# Patient Record
Sex: Female | Born: 1955 | Race: Black or African American | Hispanic: No | Smoking: Former smoker
Health system: Southern US, Community
[De-identification: ages and names within clinical notes are randomized; demographics above are authoritative.]

## PROBLEM LIST (undated history)

## (undated) DIAGNOSIS — K5900 Constipation, unspecified: Secondary | ICD-10-CM

## (undated) DIAGNOSIS — M2550 Pain in unspecified joint: Secondary | ICD-10-CM

## (undated) DIAGNOSIS — F32A Depression, unspecified: Secondary | ICD-10-CM

## (undated) DIAGNOSIS — E559 Vitamin D deficiency, unspecified: Secondary | ICD-10-CM

## (undated) DIAGNOSIS — M549 Dorsalgia, unspecified: Secondary | ICD-10-CM

## (undated) DIAGNOSIS — D649 Anemia, unspecified: Secondary | ICD-10-CM

## (undated) DIAGNOSIS — R0602 Shortness of breath: Secondary | ICD-10-CM

## (undated) DIAGNOSIS — F419 Anxiety disorder, unspecified: Secondary | ICD-10-CM

## (undated) DIAGNOSIS — E669 Obesity, unspecified: Secondary | ICD-10-CM

## (undated) DIAGNOSIS — K219 Gastro-esophageal reflux disease without esophagitis: Secondary | ICD-10-CM

## (undated) DIAGNOSIS — R600 Localized edema: Secondary | ICD-10-CM

## (undated) DIAGNOSIS — E785 Hyperlipidemia, unspecified: Secondary | ICD-10-CM

## (undated) DIAGNOSIS — R0789 Other chest pain: Secondary | ICD-10-CM

## (undated) HISTORY — DX: Gastro-esophageal reflux disease without esophagitis: K21.9

## (undated) HISTORY — DX: Anxiety disorder, unspecified: F41.9

## (undated) HISTORY — DX: Vitamin D deficiency, unspecified: E55.9

## (undated) HISTORY — DX: Dorsalgia, unspecified: M54.9

## (undated) HISTORY — DX: Pain in unspecified joint: M25.50

## (undated) HISTORY — DX: Depression, unspecified: F32.A

## (undated) HISTORY — DX: Localized edema: R60.0

## (undated) HISTORY — DX: Hyperlipidemia, unspecified: E78.5

## (undated) HISTORY — DX: Constipation, unspecified: K59.00

## (undated) HISTORY — DX: Anemia, unspecified: D64.9

## (undated) HISTORY — DX: Obesity, unspecified: E66.9

---
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
## 1981-12-21 HISTORY — PX: OOPHORECTOMY: SHX86

## 2004-08-19 ENCOUNTER — Emergency Department (HOSPITAL_COMMUNITY): Admission: EM | Admit: 2004-08-19 | Discharge: 2004-08-19 | Payer: Self-pay | Admitting: Emergency Medicine

## 2006-05-21 ENCOUNTER — Ambulatory Visit (HOSPITAL_COMMUNITY): Admission: RE | Admit: 2006-05-21 | Discharge: 2006-05-21 | Payer: Self-pay | Admitting: Nephrology

## 2006-05-24 ENCOUNTER — Ambulatory Visit (HOSPITAL_COMMUNITY): Admission: RE | Admit: 2006-05-24 | Discharge: 2006-05-24 | Payer: Self-pay | Admitting: Nephrology

## 2009-01-28 ENCOUNTER — Emergency Department (HOSPITAL_COMMUNITY): Admission: EM | Admit: 2009-01-28 | Discharge: 2009-01-28 | Payer: Self-pay | Admitting: Family Medicine

## 2009-10-14 ENCOUNTER — Encounter: Admission: RE | Admit: 2009-10-14 | Discharge: 2009-10-14 | Payer: Self-pay | Admitting: Internal Medicine

## 2009-11-05 LAB — HM COLONOSCOPY

## 2010-11-09 ENCOUNTER — Emergency Department (HOSPITAL_COMMUNITY): Admission: EM | Admit: 2010-11-09 | Discharge: 2010-11-10 | Payer: Self-pay | Admitting: Emergency Medicine

## 2010-11-09 ENCOUNTER — Emergency Department (HOSPITAL_COMMUNITY): Admission: EM | Admit: 2010-11-09 | Discharge: 2010-11-09 | Payer: Self-pay | Admitting: Family Medicine

## 2011-03-03 LAB — HEMOCCULT GUIAC POC 1CARD (OFFICE): Fecal Occult Bld: POSITIVE

## 2011-03-03 LAB — CBC
HCT: 37.6 % (ref 36.0–46.0)
Hemoglobin: 12.7 g/dL (ref 12.0–15.0)
MCH: 23.9 pg — ABNORMAL LOW (ref 26.0–34.0)
MCHC: 33.8 g/dL (ref 30.0–36.0)
MCV: 70.8 fL — ABNORMAL LOW (ref 78.0–100.0)
Platelets: 190 10*3/uL (ref 150–400)
RBC: 5.31 MIL/uL — ABNORMAL HIGH (ref 3.87–5.11)
RDW: 16 % — ABNORMAL HIGH (ref 11.5–15.5)
WBC: 7.8 10*3/uL (ref 4.0–10.5)

## 2011-03-03 LAB — URINE MICROSCOPIC-ADD ON

## 2011-03-03 LAB — COMPREHENSIVE METABOLIC PANEL
ALT: 10 U/L (ref 0–35)
AST: 16 U/L (ref 0–37)
Albumin: 3.1 g/dL — ABNORMAL LOW (ref 3.5–5.2)
Alkaline Phosphatase: 55 U/L (ref 39–117)
BUN: 9 mg/dL (ref 6–23)
CO2: 21 mEq/L (ref 19–32)
Calcium: 8.4 mg/dL (ref 8.4–10.5)
Chloride: 105 mEq/L (ref 96–112)
Creatinine, Ser: 0.71 mg/dL (ref 0.4–1.2)
GFR calc Af Amer: >60 mL/min (ref >60)
GFR calc non Af Amer: >60 mL/min (ref >60)
Glucose, Bld: 106 mg/dL — ABNORMAL HIGH (ref 70–99)
Potassium: 3.6 mEq/L (ref 3.5–5.1)
Sodium: 133 mEq/L — ABNORMAL LOW (ref 135–145)
Total Bilirubin: 0.4 mg/dL (ref 0.3–1.2)
Total Protein: 6.3 g/dL (ref 6.0–8.3)

## 2011-03-03 LAB — STOOL CULTURE

## 2011-03-03 LAB — URINALYSIS, ROUTINE W REFLEX MICROSCOPIC
Glucose, UA: NEGATIVE mg/dL
Ketones, ur: >80 mg/dL — AB
Leukocytes, UA: NEGATIVE
Nitrite: NEGATIVE
Protein, ur: 30 mg/dL — AB
Specific Gravity, Urine: 1.034 — ABNORMAL HIGH (ref 1.005–1.030)
Urobilinogen, UA: 1 mg/dL (ref 0.0–1.0)
pH: 6 (ref 5.0–8.0)

## 2011-03-03 LAB — URINE CULTURE
Colony Count: 35000
Culture  Setup Time: 201111210850

## 2011-03-03 LAB — DIFFERENTIAL
Basophils Absolute: 0 10*3/uL (ref 0.0–0.1)
Basophils Relative: 1 % (ref 0–1)
Eosinophils Absolute: 0.2 10*3/uL (ref 0.0–0.7)
Eosinophils Relative: 3 % (ref 0–5)
Lymphocytes Relative: 17 % (ref 12–46)
Lymphs Abs: 1.3 10*3/uL (ref 0.7–4.0)
Monocytes Absolute: 1.3 10*3/uL — ABNORMAL HIGH (ref 0.1–1.0)
Monocytes Relative: 17 % — ABNORMAL HIGH (ref 3–12)
Neutro Abs: 4.9 10*3/uL (ref 1.7–7.7)
Neutrophils Relative %: 63 % (ref 43–77)

## 2011-03-03 LAB — CLOSTRIDIUM DIFFICILE EIA

## 2011-03-03 LAB — GIARDIA/CRYPTOSPORIDIUM SCREEN(EIA)
Cryptosporidium Screen (EIA): NEGATIVE
Giardia Screen - EIA: NEGATIVE

## 2012-05-19 ENCOUNTER — Encounter: Payer: Managed Care, Other (non HMO) | Attending: Internal Medicine | Admitting: Dietician

## 2012-05-19 ENCOUNTER — Encounter: Payer: Self-pay | Admitting: Dietician

## 2012-05-19 DIAGNOSIS — Z713 Dietary counseling and surveillance: Secondary | ICD-10-CM | POA: Insufficient documentation

## 2012-05-19 DIAGNOSIS — E119 Type 2 diabetes mellitus without complications: Secondary | ICD-10-CM | POA: Insufficient documentation

## 2012-05-19 NOTE — Progress Notes (Signed)
  Medical Nutrition Therapy:  Appt start time: 1100 end time:  1200.  Assessment:  Primary concerns today: Coming in for diabetes counseling, has a history of diabetes for 1-2 years.  Previous A1C earlier in the year was 6.7%.  Has been working on improving her eating habits.  Her A1C on [place] [address] was 6.2%.  She currently is on the [employer] and her blood glucose levels are even better.  He mother had diabetes and had to have an amputation and this is something she is working to avoid.     Blood Glucose:. Fasting: 98, 90, 99,98,88,87,86,106,96,102,97 PC Dinner: 104, 128, 127, 77, 91, 89, 136, 99, 100, 91.  MEDICATIONS: Completed review of medications.  DIETARY INTAKE:  24-hr recall:  B (4:00 AM): Bowl of oatmeal.  Sometimes has a boiled egg with the oatmeal.  Snk (8:00 AM) :piece of fruit.  L (2:30-3:00 PM): bowl of tomato spinach soup, broccoli, roasted potatoes, sweet potato fries. 4 oz pasta salad with lime/vinegarret dressing  D (6-7-8:00  PM): Bowl of multigrain cheerioes with almond milk.  Beverages: water  Notes that she is doing the [employer] over the last 2 weeks.  She has noticed a marked decrease in her blood glucose levels when she went to the vegetable based diet.  She is following the directive her her MD and has not started the glucose lowering medication and still has fasting blood glucose levels in the range of 80-97.  This has made her aware of how much her lifestyle actions can influence her blood glucose control.  Recent physical activity: Having some issues with her feet being sore.  Has not been walking.  Has agreed to slowly resume her walking.  Discussed the possibility of using the exercise bike, it might not cause as much of a problem for her feet.  Estimated energy needs:HT: 64 in  WT: 224.0 (has lost 5 lb since starting the [employer]) BMI: 38.5 kg/m2 1300-1400 calories 150-155 g carbohydrates 90-100 g protein 37-39 g fat  Progress Towards Goal(s):  In  progress.   Nutritional Diagnosis:  [place]-2.1 Inpaired nutrition utilization As related to glucose.  As evidenced by diagnosis of type 2 diabetes with a HgA1C of 6.2%..    Intervention:  Nutrition Review of nutritional interventions and lifestyle changes to assist in controlling blood glucose and continuing to her A1C.  Handouts given during visit include:  Living Well With Diabetes  Controlling Blood Glucose.  Monitoring/Evaluation:  Dietary intake, exercise, blood glucose levels, and body weight follow-up as needed in 8-12 weeks.  To call or e-mail with questions.

## 2012-05-19 NOTE — Patient Instructions (Addendum)
   Look on line for the [employer] and see if they carry the [place].  Consider the nuts (roasted) and the nut butters for the breakfast time and for snacks.  Try to use the beans with the non-starchy veggies.  Keep the rice and pasta at 1 cup per serving.  Make sure to have some of the non-starchy veggies.  Example beans and rice, at one cup.   1400 calories with carbs at 150-155 carbs per day.  Try to the high number at 160 or less when doing the after meal blood glucose checks.

## 2012-05-23 ENCOUNTER — Encounter: Payer: Self-pay | Admitting: Dietician

## 2012-07-29 ENCOUNTER — Other Ambulatory Visit: Payer: Self-pay | Admitting: Internal Medicine

## 2012-07-29 ENCOUNTER — Telehealth: Payer: Self-pay | Admitting: Obstetrics and Gynecology

## 2012-07-29 DIAGNOSIS — Z1231 Encounter for screening mammogram for malignant neoplasm of breast: Secondary | ICD-10-CM

## 2012-08-02 ENCOUNTER — Ambulatory Visit (INDEPENDENT_AMBULATORY_CARE_PROVIDER_SITE_OTHER): Payer: Managed Care, Other (non HMO) | Admitting: Obstetrics and Gynecology

## 2012-08-02 ENCOUNTER — Encounter: Payer: Self-pay | Admitting: Obstetrics and Gynecology

## 2012-08-02 VITALS — BP 110/78 | Ht 64.0 in | Wt 224.0 lb

## 2012-08-02 DIAGNOSIS — D219 Benign neoplasm of connective and other soft tissue, unspecified: Secondary | ICD-10-CM

## 2012-08-02 DIAGNOSIS — D259 Leiomyoma of uterus, unspecified: Secondary | ICD-10-CM

## 2012-08-02 DIAGNOSIS — N926 Irregular menstruation, unspecified: Secondary | ICD-10-CM

## 2012-08-02 LAB — CBC
HCT: 38 % (ref 36.0–46.0)
Hemoglobin: 12 g/dL (ref 12.0–15.0)
MCH: 22.7 pg — ABNORMAL LOW (ref 26.0–34.0)
MCHC: 31.6 g/dL (ref 30.0–36.0)
MCV: 72 fL — ABNORMAL LOW (ref 78.0–100.0)
Platelets: 242 10*3/uL (ref 150–400)
RBC: 5.28 MIL/uL — ABNORMAL HIGH (ref 3.87–5.11)
RDW: 18.2 % — ABNORMAL HIGH (ref 11.5–15.5)
WBC: 4.8 10*3/uL (ref 4.0–10.5)

## 2012-08-02 LAB — PROLACTIN: Prolactin: 15.3 ng/mL

## 2012-08-02 LAB — TSH: TSH: 1.515 u[IU]/mL (ref 0.350–4.500)

## 2012-08-02 NOTE — Progress Notes (Signed)
When did bleeding start: Yesterday, Pt had a period in May and spotting in June but previously had not had a period for 2 years How  Long: 1 day spotting How often changing pad/tampon:   Bleeding Disorders: no Cramping: yes Contraception: no Fibroids: yes, years ago Hormone Therapy: no New Medications: no Menopausal Symptoms: yes Vag. Discharge: no Abdominal Pain: yes, sometimes Increased Stress: no BP 110/78  Ht 5\' 4"  (1.626 m)  Wt 224 lb (101.606 kg)  BMI 38.45 kg/m2  LMP 08/01/2012 Physical Examination: General appearance - alert, well appearing, and in no distress Chest - clear to auscultation, no wheezes, rales or rhonchi, symmetric air entry Heart - normal rate and regular rhythm Abdomen - soft, nontender, nondistended, no masses or organomegaly Pelvic - normal external genitalia, vulva, vagina, cervix, uterus and adnexa Extremities - peripheral pulses normal, no pedal edema, no clubbing or cyanosis PMVB Embx/us poss shg @[place]  Check cbc, tsh and prolactin Treatments reviewed will decide @[place] 

## 2012-08-02 NOTE — Patient Instructions (Signed)
Fibroids Fibroids are lumps (tumors) that can occur any place in a woman's body. These lumps are not cancerous. Fibroids vary in size, weight, and where they grow. HOME CARE  Do not take aspirin.   Write down the number of pads or tampons you use during your period. Tell your doctor. This can help determine the best treatment for you.  GET HELP RIGHT AWAY IF:  You have pain in your lower belly (abdomen) that is not helped with medicine.   You have cramps that are not helped with medicine.   You have more bleeding between or during your period.   You feel lightheaded or pass out (faint).   Your lower belly pain gets worse.  MAKE SURE YOU:  Understand these instructions.   Will watch your condition.   Will get help right away if you are not doing well or get worse.  Document Released: 01/09/2011 Document Revised: 11/26/2011 Document Reviewed: 01/09/2011 [employer] Patient Information 2012 [employer]. 

## 2012-08-15 ENCOUNTER — Ambulatory Visit
Admission: RE | Admit: 2012-08-15 | Discharge: 2012-08-15 | Disposition: A | Discharge status: Home / Self Care | Payer: Managed Care, Other (non HMO) | Source: Ambulatory Visit | Attending: Internal Medicine | Admitting: Internal Medicine

## 2012-08-15 DIAGNOSIS — Z1231 Encounter for screening mammogram for malignant neoplasm of breast: Secondary | ICD-10-CM

## 2013-08-22 ENCOUNTER — Encounter (HOSPITAL_COMMUNITY): Payer: Self-pay | Admitting: Emergency Medicine

## 2013-08-22 ENCOUNTER — Emergency Department (INDEPENDENT_AMBULATORY_CARE_PROVIDER_SITE_OTHER)
Admission: EM | Admit: 2013-08-22 | Discharge: 2013-08-22 | Disposition: A | Discharge status: Home / Self Care | Payer: Managed Care, Other (non HMO) | Source: Home / Self Care | Attending: Family Medicine | Admitting: Family Medicine

## 2013-08-22 DIAGNOSIS — M461 Sacroiliitis, not elsewhere classified: Secondary | ICD-10-CM

## 2013-08-22 DIAGNOSIS — M549 Dorsalgia, unspecified: Secondary | ICD-10-CM

## 2013-08-22 MED ORDER — NAPROXEN 500 MG PO TABS: 500.0000 mg | ORAL_TABLET | Freq: Two times a day (BID) | ORAL | Status: DC

## 2013-08-22 MED ORDER — TRAMADOL HCL 50 MG PO TABS: 50.0000 mg | ORAL_TABLET | Freq: Four times a day (QID) | ORAL | Status: DC | PRN
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
## 2013-08-23 NOTE — ED Provider Notes (Signed)
Medical screening examination/treatment/procedure(s) were performed by resident physician or non-physician practitioner and as supervising physician I was immediately available for consultation/collaboration.   KINDL,JAMES DOUGLAS MD.   James D Kindl, MD 08/23/13 2143 

## 2014-02-23 ENCOUNTER — Other Ambulatory Visit: Payer: Self-pay

## 2014-02-23 DIAGNOSIS — Z1231 Encounter for screening mammogram for malignant neoplasm of breast: Secondary | ICD-10-CM

## 2014-03-01 ENCOUNTER — Ambulatory Visit
Admission: RE | Admit: 2014-03-01 | Discharge: 2014-03-01 | Disposition: A | Discharge status: Home / Self Care | Payer: Managed Care, Other (non HMO) | Source: Ambulatory Visit

## 2014-03-01 DIAGNOSIS — Z1231 Encounter for screening mammogram for malignant neoplasm of breast: Secondary | ICD-10-CM

## 2014-10-22 ENCOUNTER — Encounter (HOSPITAL_COMMUNITY): Payer: Self-pay | Admitting: Emergency Medicine

## 2015-12-20 ENCOUNTER — Other Ambulatory Visit: Payer: Self-pay

## 2015-12-20 DIAGNOSIS — Z1231 Encounter for screening mammogram for malignant neoplasm of breast: Secondary | ICD-10-CM

## 2015-12-27 ENCOUNTER — Ambulatory Visit
Admission: RE | Admit: 2015-12-27 | Discharge: 2015-12-27 | Disposition: A | Discharge status: Home / Self Care | Payer: Managed Care, Other (non HMO) | Source: Ambulatory Visit

## 2015-12-27 DIAGNOSIS — Z1231 Encounter for screening mammogram for malignant neoplasm of breast: Secondary | ICD-10-CM

## 2016-06-24 ENCOUNTER — Ambulatory Visit (HOSPITAL_COMMUNITY)
Admission: EM | Admit: 2016-06-24 | Discharge: 2016-06-24 | Disposition: A | Discharge status: Home / Self Care | Payer: Managed Care, Other (non HMO) | Attending: Emergency Medicine | Admitting: Emergency Medicine

## 2016-06-24 ENCOUNTER — Encounter (HOSPITAL_COMMUNITY): Payer: Self-pay | Admitting: Emergency Medicine

## 2016-06-24 DIAGNOSIS — M5441 Lumbago with sciatica, right side: Secondary | ICD-10-CM | POA: Diagnosis not present

## 2016-06-24 DIAGNOSIS — M5431 Sciatica, right side: Secondary | ICD-10-CM

## 2016-06-24 MED ORDER — CYCLOBENZAPRINE HCL 10 MG PO TABS: 10.0000 mg | ORAL_TABLET | Freq: Two times a day (BID) | ORAL | Status: DC | PRN

## 2016-06-24 MED ORDER — PREDNISONE 10 MG PO TABS: 20.0000 mg | ORAL_TABLET | Freq: Every day | ORAL | Status: DC

## 2016-06-24 MED ORDER — HYDROCODONE-ACETAMINOPHEN 5-325 MG PO TABS: 1.0000 | ORAL_TABLET | Freq: Every evening | ORAL | Status: DC | PRN

## 2016-06-24 NOTE — ED Provider Notes (Signed)
CSN: [id number]     Arrival date & time 06/24/16  1002 History   First MD Initiated Contact with Patient 06/24/16 1059     Chief Complaint  Patient presents with  . Back Pain   (Consider location/radiation/quality/duration/timing/severity/associated sxs/prior Treatment) HPI History obtained from patient: Location:  Right low back Context/Duration: Onset approximately one week ago suddenly while helping at work  Severity: 4  Quality: Aching, throbbing Timing:    Constant        Home Treatment: Over-the-counter medicines without relief of symptoms Associated symptoms:  Painful to sit for long periods of time Family History: Diabetes-mother    Past Medical History  Diagnosis Date  . Diabetes mellitus   . Hyperlipidemia    Past Surgical History  Procedure Laterality Date  . Oophorectomy      left ovary removed   Family History  Problem Relation Age of Onset  . Diabetes Mother   . Cancer Other   . Hypertension Other   . Hyperlipidemia Other   . Stroke Other   . Heart attack Other    Social History  Substance Use Topics  . Smoking status: Current Some Day Smoker -- 0.10 packs/day    Types: Cigarettes  . Smokeless tobacco: Never Used  . Alcohol Use: No   OB History    Gravida Para Term Preterm AB TAB SAB Ectopic Multiple Living   6 3        3      Review of Systems  Denies: HEADACHE, NAUSEA, ABDOMINAL PAIN, CHEST PAIN, CONGESTION, DYSURIA, SHORTNESS OF BREATH  Allergies  Eggs or egg-derived products and Penicillins  Home Medications   Prior to Admission medications   Medication Sig Start Date End Date Taking? Authorizing Provider  cyclobenzaprine (FLEXERIL) 10 MG tablet Take 10 mg by mouth Nightly. Take at bedtime    Historical Provider, MD  naproxen (NAPROSYN) 500 MG tablet Take 1 tablet (500 mg total) by mouth 2 (two) times daily. 08/22/13   Liam Graham, PA-C  Saxagliptin-Metformin (KOMBIGLYZE XR) 5-500 MG TB24 Take 1 tablet by mouth daily with supper.     Historical Provider, MD  simvastatin (ZOCOR) 20 MG tablet Take 20 mg by mouth every evening.    Historical Provider, MD  sitaGLIPtin-metformin (JANUMET) 50-1000 MG tablet Take 1 tablet by mouth 2 (two) times daily with a meal.    Historical Provider, MD  traMADol (ULTRAM) 50 MG tablet Take 1 tablet (50 mg total) by mouth every 6 (six) hours as needed for pain. 08/22/13   Liam Graham, PA-C   Meds Ordered and Administered this Visit  Medications - No data to display  BP 121/79 mmHg  Pulse 63  Temp(Src) 97.7 F (36.5 C) (Oral)  Resp 18  SpO2 100%  LMP 08/12/2012 No data found.   Physical Exam NURSES NOTES AND VITAL SIGNS REVIEWED. CONSTITUTIONAL: Well developed, well nourished, no acute distress HEENT: normocephalic, atraumatic EYES: Conjunctiva normal NECK:normal ROM, supple, no adenopathy PULMONARY:No respiratory distress, normal effort ABDOMINAL: Soft, ND, NT BS+, No CVAT MUSCULOSKELETAL: Normal ROM of all extremities, Right lumbar spine tender to palpation. There is negative left straight leg raise positive right straight leg raise. SKIN: warm and dry without rash PSYCHIATRIC: Mood and affect, behavior are normal  ED Course  Procedures (including critical care time)  Labs Review Labs Reviewed - No data to display  Imaging Review No results found.   Visual Acuity Review  Right Eye Distance:   Left Eye Distance:   Bilateral Distance:  Right Eye Near:   Left Eye Near:    Bilateral Near:       Prescriptions for prednisone and Flexeril are provided.  MDM   1. Right-sided low back pain with right-sided sciatica   2. Sciatica of right side     Patient is reassured that there are no issues that require transfer to higher level of care at this time or additional tests. Patient is advised to continue home symptomatic treatment. Patient is advised that if there are new or worsening symptoms to attend the emergency department, contact primary care provider, or  return to [facility]. Instructions of care provided discharged home in stable condition.    THIS NOTE WAS GENERATED USING A VOICE RECOGNITION SOFTWARE PROGRAM. ALL REASONABLE EFFORTS  WERE MADE TO PROOFREAD THIS DOCUMENT FOR ACCURACY.  I have verbally reviewed the discharge instructions with the patient. A printed AVS was given to the patient.  All questions were answered prior to discharge.      Konrad Felix, [place] 06/24/16 1351

## 2016-06-24 NOTE — Discharge Instructions (Signed)
Back Exercises °The following exercises strengthen the muscles that help to support the back. They also help to keep the lower back flexible. Doing these exercises can help to prevent back pain or lessen existing pain. °If you have back pain or discomfort, try doing these exercises 2-3 times each day or as told by your health care provider. When the pain goes away, do them once each day, but increase the number of times that you repeat the steps for each exercise (do more repetitions). If you do not have back pain or discomfort, do these exercises once each day or as told by your health care provider. °EXERCISES °Single Knee to Chest °Repeat these steps 3-5 times for each leg: °· Lie on your back on a firm bed or the floor with your legs extended. °· Bring one knee to your chest. Your other leg should stay extended and in contact with the floor. °· Hold your knee in place by grabbing your knee or thigh. °· Pull on your knee until you feel a gentle stretch in your lower back. °· Hold the stretch for 10-30 seconds. °· Slowly release and straighten your leg. °Pelvic Tilt °Repeat these steps 5-10 times: °· Lie on your back on a firm bed or the floor with your legs extended. °· Bend your knees so they are pointing toward the ceiling and your feet are flat on the floor. °· Tighten your lower abdominal muscles to press your lower back against the floor. This motion will tilt your pelvis so your tailbone points up toward the ceiling instead of pointing to your feet or the floor. °· With gentle tension and even breathing, hold this position for 5-10 seconds. °Cat-Cow °Repeat these steps until your lower back becomes more flexible: °1. Get into a hands-and-knees position on a firm surface. Keep your hands under your shoulders, and keep your knees under your hips. You may place padding under your knees for comfort. °2. Let your head hang down, and point your tailbone toward the floor so your lower back becomes rounded like the  back of a cat. °3. Hold this position for 5 seconds. °4. Slowly lift your head and point your tailbone up toward the ceiling so your back forms a sagging arch like the back of a cow. °5. Hold this position for 5 seconds. °Press-Ups °Repeat these steps 5-10 times: °1. Lie on your abdomen (face-down) on the floor. °2. Place your palms near your head, about shoulder-width apart. °3. While you keep your back as relaxed as possible and keep your hips on the floor, slowly straighten your arms to raise the top half of your body and lift your shoulders. Do not use your back muscles to raise your upper torso. You may adjust the placement of your hands to make yourself more comfortable. °4. Hold this position for 5 seconds while you keep your back relaxed. °5. Slowly return to lying flat on the floor. °Bridges °Repeat these steps 10 times: °1. Lie on your back on a firm surface. °2. Bend your knees so they are pointing toward the ceiling and your feet are flat on the floor. °3. Tighten your buttocks muscles and lift your buttocks off of the floor until your waist is at almost the same height as your knees. You should feel the muscles working in your buttocks and the back of your thighs. If you do not feel these muscles, slide your feet 1-2 inches farther away from your buttocks. °4. Hold this position for 3-5   seconds. 5. Slowly lower your hips to the starting position, and allow your buttocks muscles to relax completely. If this exercise is too easy, try doing it with your arms crossed over your chest. Abdominal Crunches Repeat these steps 5-10 times: 1. Lie on your back on a firm bed or the floor with your legs extended. 2. Bend your knees so they are pointing toward the ceiling and your feet are flat on the floor. 3. Cross your arms over your chest. 4. Tip your chin slightly toward your chest without bending your neck. 5. Tighten your abdominal muscles and slowly raise your trunk (torso) high enough to lift your  shoulder blades a tiny bit off of the floor. Avoid raising your torso higher than that, because it can put too much stress on your low back and it does not help to strengthen your abdominal muscles. 6. Slowly return to your starting position. Back Lifts Repeat these steps 5-10 times: 1. Lie on your abdomen (face-down) with your arms at your sides, and rest your forehead on the floor. 2. Tighten the muscles in your legs and your buttocks. 3. Slowly lift your chest off of the floor while you keep your hips pressed to the floor. Keep the back of your head in line with the curve in your back. Your eyes should be looking at the floor. 4. Hold this position for 3-5 seconds. 5. Slowly return to your starting position. SEEK MEDICAL CARE IF:  Your back pain or discomfort gets much worse when you do an exercise.  Your back pain or discomfort does not lessen within 2 hours after you exercise. If you have any of these problems, stop doing these exercises right away. Do not do them again unless your health care provider says that you can. SEEK IMMEDIATE MEDICAL CARE IF:  You develop sudden, severe back pain. If this happens, stop doing the exercises right away. Do not do them again unless your health care provider says that you can.   This information is not intended to replace advice given to you by your health care provider. Make sure you discuss any questions you have with your health care provider.   Document Released: 01/14/2005 Document Revised: 08/28/2015 Document Reviewed: 01/31/2015 Elsevier Interactive Patient Education 2016 [employer] Radicular Pain Radicular pain in either the arm or leg is usually from a bulging or herniated disk in the spine. A piece of the herniated disk may press against the nerves as the nerves exit the spine. This causes pain which is felt at the tips of the nerves down the arm or leg. Other causes of radicular pain may include:  Fractures.  Heart  disease.  Cancer.  An abnormal and usually degenerative state of the nervous system or nerves (neuropathy). Diagnosis may require CT or MRI scanning to determine the primary cause.  Nerves that start at the neck (nerve roots) may cause radicular pain in the outer shoulder and arm. It can spread down to the thumb and fingers. The symptoms vary depending on which nerve root has been affected. In most cases radicular pain improves with conservative treatment. Neck problems may require physical therapy, a neck collar, or cervical traction. Treatment may take many weeks, and surgery may be considered if the symptoms do not improve.  Conservative treatment is also recommended for sciatica. Sciatica causes pain to radiate from the lower back or buttock area down the leg into the foot. Often there is a history of back problems. Most patients with sciatica  are better after 2 to 4 weeks of rest and other supportive care. Short term bed rest can reduce the disk pressure considerably. Sitting, however, is not a good position since this increases the pressure on the disk. You should avoid bending, lifting, and all other activities which make the problem worse. Traction can be used in severe cases. Surgery is usually reserved for patients who do not improve within the first months of treatment. Only take over-the-counter or prescription medicines for pain, discomfort, or fever as directed by your caregiver. Narcotics and muscle relaxants may help by relieving more severe pain and spasm and by providing mild sedation. Cold or massage can give significant relief. Spinal manipulation is not recommended. It can increase the degree of disc protrusion. Epidural steroid injections are often effective treatment for radicular pain. These injections deliver medicine to the spinal nerve in the space between the protective covering of the spinal cord and back bones (vertebrae). Your caregiver can give you more information about  steroid injections. These injections are most effective when given within two weeks of the onset of pain.  You should see your caregiver for follow up care as recommended. A program for neck and back injury rehabilitation with stretching and strengthening exercises is an important part of management.  SEEK IMMEDIATE MEDICAL CARE IF:  You develop increased pain, weakness, or numbness in your arm or leg.  You develop difficulty with bladder or bowel control.  You develop abdominal pain.   This information is not intended to replace advice given to you by your health care provider. Make sure you discuss any questions you have with your health care provider.   Document Released: 01/14/2005 Document Revised: 12/28/2014 Document Reviewed: 07/03/2015 Elsevier Interactive Patient Education [employer].

## 2016-06-24 NOTE — ED Notes (Signed)
The patient presented to the [facility] with a complaint of lower back pain that radiates down her right leg x 1 week. The patient stated that she did do some extensive moving at work that she does not normally do.

## 2018-02-25 ENCOUNTER — Other Ambulatory Visit: Payer: Self-pay | Admitting: Internal Medicine

## 2018-02-25 DIAGNOSIS — Z1231 Encounter for screening mammogram for malignant neoplasm of breast: Secondary | ICD-10-CM

## 2018-03-16 ENCOUNTER — Ambulatory Visit
Admission: RE | Admit: 2018-03-16 | Discharge: 2018-03-16 | Disposition: A | Discharge status: Home / Self Care | Payer: Managed Care, Other (non HMO) | Source: Ambulatory Visit | Attending: Internal Medicine | Admitting: Internal Medicine

## 2018-03-16 DIAGNOSIS — Z1231 Encounter for screening mammogram for malignant neoplasm of breast: Secondary | ICD-10-CM

## 2018-03-17 ENCOUNTER — Other Ambulatory Visit: Payer: Self-pay | Admitting: Internal Medicine

## 2018-03-17 DIAGNOSIS — R928 Other abnormal and inconclusive findings on diagnostic imaging of breast: Secondary | ICD-10-CM

## 2018-03-22 ENCOUNTER — Other Ambulatory Visit: Payer: Self-pay | Admitting: Internal Medicine

## 2018-03-22 ENCOUNTER — Ambulatory Visit
Admission: RE | Admit: 2018-03-22 | Discharge: 2018-03-22 | Disposition: A | Discharge status: Home / Self Care | Payer: BLUE CROSS/BLUE SHIELD | Source: Ambulatory Visit | Attending: Internal Medicine | Admitting: Internal Medicine

## 2018-03-22 DIAGNOSIS — R928 Other abnormal and inconclusive findings on diagnostic imaging of breast: Secondary | ICD-10-CM

## 2018-03-22 DIAGNOSIS — N6310 Unspecified lump in the right breast, unspecified quadrant: Secondary | ICD-10-CM

## 2018-08-19 ENCOUNTER — Other Ambulatory Visit: Payer: Self-pay | Admitting: Internal Medicine

## 2018-08-19 DIAGNOSIS — M79604 Pain in right leg: Secondary | ICD-10-CM

## 2018-08-25 ENCOUNTER — Other Ambulatory Visit: Payer: BLUE CROSS/BLUE SHIELD

## 2018-09-22 ENCOUNTER — Ambulatory Visit
Admission: RE | Admit: 2018-09-22 | Discharge: 2018-09-22 | Disposition: A | Discharge status: Home / Self Care | Payer: BLUE CROSS/BLUE SHIELD | Source: Ambulatory Visit | Attending: Internal Medicine | Admitting: Internal Medicine

## 2018-09-22 DIAGNOSIS — N6310 Unspecified lump in the right breast, unspecified quadrant: Secondary | ICD-10-CM

## 2018-10-03 ENCOUNTER — Other Ambulatory Visit: Payer: Self-pay | Admitting: Internal Medicine

## 2018-10-03 MED ORDER — SEMAGLUTIDE(0.25 OR 0.5MG/DOS) 2 MG/1.5ML ~~LOC~~ SOPN: 0.5000 mg | PEN_INJECTOR | SUBCUTANEOUS | 5 refills | Status: DC

## 2018-10-05 ENCOUNTER — Other Ambulatory Visit: Payer: Self-pay

## 2018-10-06 ENCOUNTER — Other Ambulatory Visit: Payer: Self-pay

## 2018-10-10 ENCOUNTER — Other Ambulatory Visit: Payer: Self-pay

## 2018-10-10 MED ORDER — SEMAGLUTIDE(0.25 OR 0.5MG/DOS) 2 MG/1.5ML ~~LOC~~ SOPN: 0.5000 mg | PEN_INJECTOR | SUBCUTANEOUS | 5 refills | Status: DC

## 2018-12-22 ENCOUNTER — Ambulatory Visit: Payer: BLUE CROSS/BLUE SHIELD | Admitting: Internal Medicine

## 2018-12-22 ENCOUNTER — Encounter: Payer: Self-pay | Admitting: Internal Medicine

## 2018-12-22 ENCOUNTER — Other Ambulatory Visit: Payer: Self-pay

## 2018-12-22 VITALS — BP 116/82 | HR 66 | Temp 97.9°F | Ht 64.0 in | Wt 247.4 lb

## 2018-12-22 DIAGNOSIS — Z6841 Body Mass Index (BMI) 40.0 and over, adult: Secondary | ICD-10-CM

## 2018-12-22 DIAGNOSIS — E1165 Type 2 diabetes mellitus with hyperglycemia: Secondary | ICD-10-CM

## 2018-12-22 DIAGNOSIS — R0602 Shortness of breath: Secondary | ICD-10-CM

## 2018-12-22 DIAGNOSIS — Z6835 Body mass index (BMI) 35.0-35.9, adult: Secondary | ICD-10-CM | POA: Insufficient documentation

## 2018-12-22 DIAGNOSIS — Z6836 Body mass index (BMI) 36.0-36.9, adult: Secondary | ICD-10-CM | POA: Insufficient documentation

## 2018-12-22 DIAGNOSIS — Z23 Encounter for immunization: Secondary | ICD-10-CM | POA: Diagnosis not present

## 2018-12-22 MED ORDER — SEMAGLUTIDE (1 MG/DOSE) 2 MG/1.5ML ~~LOC~~ SOPN
1.0000 mg | PEN_INJECTOR | SUBCUTANEOUS | 1 refills | Status: DC
Start: 2018-12-22 — End: 2019-01-30

## 2018-12-22 NOTE — Progress Notes (Signed)
Subjective:     Patient ID: Stephanie French , female    DOB: 1956/07/06 , 63 y.o.   MRN: [id number]   Chief Complaint  Patient presents with  . Diabetes    HPI  Diabetes  She presents for her follow-up diabetic visit. She has type 2 diabetes mellitus. Her disease course has been stable. There are no hypoglycemic associated symptoms. Pertinent negatives for diabetes include no blurred vision and no chest pain. There are no hypoglycemic complications. Risk factors for coronary artery disease include diabetes mellitus, post-menopausal, sedentary lifestyle and obesity. She participates in exercise intermittently. Her breakfast blood glucose is taken between 8-9 am. Her breakfast blood glucose range is generally 90-110 mg/dl.     Past Medical History:  Diagnosis Date  . Diabetes mellitus   . Hyperlipidemia   . Obesity      Family History  Problem Relation Age of Onset  . Diabetes Mother   . Hypertension Mother   . Cancer Other   . Hypertension Other   . Hyperlipidemia Other   . Stroke Other   . Heart attack Other   . Breast cancer Maternal Grandmother   . Early death Father      Current Outpatient Medications:  .  Semaglutide, 1 MG/DOSE, (OZEMPIC, 1 MG/DOSE,) 2 MG/1.5ML SOPN, Inject 1 mg into the skin once a week., Disp: 1 pen, Rfl: 1   Allergies  Allergen Reactions  . Eggs Or Egg-Derived Products     Allergic to the protein in the egg white  . Penicillins      Review of Systems  Constitutional: Negative.   Eyes: Negative for blurred vision.  Respiratory: Positive for shortness of breath (she c/o sob when walking up steps. she was not experiencing this at her physical 4 months ago. no cp. no palpitations).   Cardiovascular: Negative.  Negative for chest pain.  Gastrointestinal: Negative.   Neurological: Negative.   Psychiatric/Behavioral: Negative.      Today's Vitals   12/22/18 0843  BP: 116/82  Pulse: 66  Temp: 97.9 F (36.6 C)  TempSrc: Oral  Weight: 247  lb 6.4 oz (112.2 kg)  Height: _0  (1.626 m)   Body mass index is 42.47 kg/m.   Objective:  Physical Exam Vitals signs and nursing note reviewed.  Constitutional:      Appearance: Normal appearance. She is obese.  HENT:     Head: Normocephalic and atraumatic.  Neck:     Musculoskeletal: Normal range of motion.  Cardiovascular:     Rate and Rhythm: Normal rate and regular rhythm.     Heart sounds: Normal heart sounds.  Pulmonary:     Effort: Pulmonary effort is normal.     Breath sounds: Normal breath sounds.  Skin:    General: Skin is warm.  Neurological:     General: No focal deficit present.     Mental Status: She is alert.  Psychiatric:        Mood and Affect: Mood normal.         Assessment And Plan:     1. Uncontrolled type 2 diabetes mellitus with hyperglycemia ([place])  I will check labs as listed below. I plan to increase her dose of Ozempic to 53m once weekly. She will inject 0.568mtwice at her next visit. If she tolerates this, she is agreeable to go to the higher dose.   - HIV antibody (with reflex) - CMP14+EGFR - Hemoglobin A1c  2. Class 3 severe obesity due to excess  calories with serious comorbidity and body mass index (BMI) of 40.0 to 44.9 in adult [facility])  She is encouraged to strive for BMI less than 35 to decrease cardiac risk. She is encouraged to exercise 30 minutes five days weekly.    3. Shortness of breath  Due to her cardiac risk factors, I will refer her to cardiology for further evaluation. Pt advised that her sx could also be due to deconditioning.   - EKG 12-Lead  4. Need for vaccination  - Flu Vaccine QUAD 6+ mos PF IM (Fluarix Quad PF)   Maximino Greenland, MD

## 2018-12-23 LAB — HEMOGLOBIN A1C
Est. average glucose Bld gHb Est-mCnc: 128 mg/dL
Hgb A1c MFr Bld: 6.1 % — ABNORMAL HIGH (ref 4.8–5.6)

## 2018-12-23 LAB — CMP14+EGFR
ALT: 12 IU/L (ref 0–32)
AST: 21 IU/L (ref 0–40)
Albumin/Globulin Ratio: 1.5 (ref 1.2–2.2)
Albumin: 4.2 g/dL (ref 3.6–4.8)
Alkaline Phosphatase: 84 IU/L (ref 39–117)
BUN/Creatinine Ratio: 13 (ref 12–28)
BUN: 12 mg/dL (ref 8–27)
Bilirubin Total: 0.3 mg/dL (ref 0.0–1.2)
CO2: 19 mmol/L — ABNORMAL LOW (ref 20–29)
Calcium: 9.5 mg/dL (ref 8.7–10.3)
Chloride: 103 mmol/L (ref 96–106)
Creatinine, Ser: 0.92 mg/dL (ref 0.57–1.00)
GFR calc Af Amer: 78 mL/min/{1.73_m2} (ref >59)
GFR calc non Af Amer: 67 mL/min/{1.73_m2} (ref >59)
Globulin, Total: 2.8 g/dL (ref 1.5–4.5)
Glucose: 83 mg/dL (ref 65–99)
Potassium: 4.8 mmol/L (ref 3.5–5.2)
Sodium: 136 mmol/L (ref 134–144)
Total Protein: 7 g/dL (ref 6.0–8.5)

## 2018-12-23 LAB — HIV ANTIBODY (ROUTINE TESTING W REFLEX): HIV Screen 4th Generation wRfx: NONREACTIVE

## 2018-12-24 NOTE — Progress Notes (Signed)
Here are your lab results:  You are HIV negative.  Your liver and kidney function are normal.  Your hba1c is great at 6.1.   Be sure to incorporate those 5 days of exercise that we discussed during your visit! Aim for 30 minutes!  Sincerely,    Jayliah Benett N. Baird Cancer, MD

## 2019-01-30 ENCOUNTER — Other Ambulatory Visit: Payer: Self-pay

## 2019-01-30 MED ORDER — SEMAGLUTIDE (1 MG/DOSE) 2 MG/1.5ML ~~LOC~~ SOPN: 1.0000 mg | PEN_INJECTOR | SUBCUTANEOUS | 1 refills | Status: DC

## 2019-02-25 ENCOUNTER — Ambulatory Visit (HOSPITAL_COMMUNITY)
Admission: EM | Admit: 2019-02-25 | Discharge: 2019-02-25 | Disposition: A | Discharge status: Home / Self Care | Payer: BLUE CROSS/BLUE SHIELD | Attending: Family Medicine | Admitting: Family Medicine

## 2019-02-25 ENCOUNTER — Encounter (HOSPITAL_COMMUNITY): Payer: Self-pay

## 2019-02-25 DIAGNOSIS — B029 Zoster without complications: Secondary | ICD-10-CM | POA: Diagnosis not present

## 2019-02-25 MED ORDER — GABAPENTIN 300 MG PO CAPS: 300.0000 mg | ORAL_CAPSULE | Freq: Three times a day (TID) | ORAL | 0 refills | Status: DC | PRN

## 2019-02-25 MED ORDER — VALACYCLOVIR HCL 1 G PO TABS: 1000.0000 mg | ORAL_TABLET | Freq: Three times a day (TID) | ORAL | 0 refills | Status: AC

## 2019-02-25 NOTE — Discharge Instructions (Signed)
I am worried about shingles, please start provided antiviral.  May use gabapentin as needed for pain. May cause drowsiness. Please do not take if driving or drinking alcohol.

## 2019-02-25 NOTE — ED Triage Notes (Signed)
Pt presents with rash on right flank & abdomen area that itches and burns X 7 days.

## 2019-02-25 NOTE — ED Provider Notes (Signed)
[place]    CSN: [id number] Arrival date & time: 02/25/19  1003     History   Chief Complaint Chief Complaint  Patient presents with  . Rash    HPI Stephanie French is a 63 y.o. female.   Leara presents with complaints of rash to right flank. States started out three weeks ago with itching and burning sensation. Noted rash yesterday, still feels burning and itchy. Burning radiates from right back around flank to right abdomen.  Denies any previous similar. No fevers. No known new exposure to products or ill contacts. Did have chicken pox in her youth. Did not get a shingles vaccine. No URI symptoms. Tried benadryl which didn't help and took ibuprofen last night which didn't help with pain. Has a newborn granddaughter so was concerned about visiting due to rash. Hx of DM.     ROS per HPI, negative if not otherwise mentioned.      Past Medical History:  Diagnosis Date  . Diabetes mellitus   . Hyperlipidemia   . Obesity     Patient Active Problem List   Diagnosis Date Noted  . Uncontrolled type 2 diabetes mellitus with hyperglycemia ([place]) 12/22/2018  . Class 3 severe obesity due to excess calories with serious comorbidity and body mass index (BMI) of 40.0 to 44.9 in adult [facility]) 12/22/2018  . Fibroids 08/02/2012    Past Surgical History:  Procedure Laterality Date  . OOPHORECTOMY     left ovary removed    OB History    Gravida  6   Para  3   Term      Preterm      AB      Living  3     SAB      TAB      Ectopic      Multiple      Live Births               Home Medications    Prior to Admission medications   Medication Sig Start Date End Date Taking? Authorizing Provider  gabapentin (NEURONTIN) 300 MG capsule Take 1 capsule (300 mg total) by mouth 3 (three) times daily as needed. 02/25/19   Zigmund Gottron, NP  Semaglutide, 1 MG/DOSE, (OZEMPIC, 1 MG/DOSE,) 2 MG/1.5ML SOPN Inject 1 mg into the skin once a week. 01/30/19   Glendale Chard, MD  valACYclovir (VALTREX) 1000 MG tablet Take 1 tablet (1,000 mg total) by mouth 3 (three) times daily for 7 days. 02/25/19 03/04/19  Zigmund Gottron, NP    Family History Family History  Problem Relation Age of Onset  . Diabetes Mother   . Hypertension Mother   . Cancer Other   . Hypertension Other   . Hyperlipidemia Other   . Stroke Other   . Heart attack Other   . Breast cancer Maternal Grandmother   . Early death Father     Social History Social History   Tobacco Use  . Smoking status: Former Smoker    Packs/day: 0.10    Years: 40.00    Pack years: 4.00    Types: Cigarettes  . Smokeless tobacco: Never Used  Substance Use Topics  . Alcohol use: No  . Drug use: No     Allergies   Eggs or egg-derived products; Latex; and Penicillins   Review of Systems Review of Systems   Physical Exam Triage Vital Signs ED Triage Vitals [02/25/19 1026]  Enc Vitals Group  BP 140/90     Pulse Rate 75     Resp 18     Temp 98.1 F (36.7 C)     Temp Source Oral     SpO2 100 %     Weight      Height      Head Circumference      Peak Flow      Pain Score 8     Pain Loc      Pain Edu?      Excl. in [place]?    No data found.  Updated Vital Signs BP 140/90 (BP Location: Left Arm)   Pulse 75   Temp 98.1 F (36.7 C) (Oral)   Resp 18   LMP 08/12/2012   SpO2 100%   Visual Acuity Right Eye Distance:   Left Eye Distance:   Bilateral Distance:    Right Eye Near:   Left Eye Near:    Bilateral Near:     Physical Exam Constitutional:      General: She is not in acute distress.    Appearance: She is well-developed.  Cardiovascular:     Rate and Rhythm: Normal rate and regular rhythm.     Heart sounds: Normal heart sounds.  Pulmonary:     Effort: Pulmonary effort is normal.     Breath sounds: Normal breath sounds.  Skin:    General: Skin is warm and dry.     Findings: Rash present. Rash is vesicular.          Comments: Right flank with linear vesicular  rash, intact; no surrounding redness. No visible drainage; isolated to single dermatome   Neurological:     Mental Status: She is alert and oriented to person, place, and time.      UC Treatments / Results  Labs (all labs ordered are listed, but only abnormal results are displayed) Labs Reviewed - No data to display  EKG None  Radiology No results found.  Procedures Procedures (including critical care time)  Medications Ordered in UC Medications - No data to display  Initial Impression / Assessment and Plan / UC Course  I have reviewed the triage vital signs and the nursing notes.  Pertinent labs & imaging results that were available during my care of the patient were reviewed by me and considered in my medical decision making (see chart for details).     History and physical concerning for shingles with valtrex initiated. Use of gabapentin prn with drowsy precautions provided. Contagiousness discussed. If symptoms worsen or do not improve in the next few weeks to return to be seen or to follow up with PCP.  Patient verbalized understanding and agreeable to plan.   Final Clinical Impressions(s) / UC Diagnoses   Final diagnoses:  Herpes zoster without complication     Discharge Instructions     I am worried about shingles, please start provided antiviral.  May use gabapentin as needed for pain. May cause drowsiness. Please do not take if driving or drinking alcohol.      ED Prescriptions    Medication Sig Dispense Auth. Provider   valACYclovir (VALTREX) 1000 MG tablet Take 1 tablet (1,000 mg total) by mouth 3 (three) times daily for 7 days. 21 tablet Augusto Gamble B, NP   gabapentin (NEURONTIN) 300 MG capsule Take 1 capsule (300 mg total) by mouth 3 (three) times daily as needed. 30 capsule Zigmund Gottron, NP     Controlled Substance Prescriptions [place] Controlled Substance Registry consulted?  Not Applicable   Zigmund Gottron, NP 02/25/19 1103

## 2019-02-27 ENCOUNTER — Encounter: Payer: Self-pay | Admitting: Internal Medicine

## 2019-02-28 ENCOUNTER — Ambulatory Visit: Payer: BLUE CROSS/BLUE SHIELD | Admitting: Internal Medicine

## 2019-02-28 ENCOUNTER — Encounter: Payer: Self-pay | Admitting: Internal Medicine

## 2019-02-28 VITALS — BP 126/84 | HR 76 | Temp 98.0°F | Wt 242.0 lb

## 2019-02-28 DIAGNOSIS — Z09 Encounter for follow-up examination after completed treatment for conditions other than malignant neoplasm: Secondary | ICD-10-CM

## 2019-02-28 DIAGNOSIS — B029 Zoster without complications: Secondary | ICD-10-CM

## 2019-02-28 MED ORDER — ACYCLOVIR 5 % EX OINT: TOPICAL_OINTMENT | CUTANEOUS | 0 refills | Status: DC

## 2019-02-28 NOTE — Patient Instructions (Signed)
Postherpetic Neuralgia  Postherpetic neuralgia (PHN) is nerve pain that occurs after a shingles infection. Shingles is a painful rash that appears on one area of the body, usually on the trunk or face. Shingles is caused by the varicella-zoster virus. This is the same virus that causes chickenpox. In people who have had chickenpox, the virus can resurface years later and cause shingles.  You may have PHN if you continue to have pain for 4 months after your shingles rash has gone away. PHN appears in the same area where you had the shingles rash. The pain usually goes away after the rash disappears.  Getting a vaccination for shingles can prevent PHN. This vaccine is recommended for people older than 60. It may prevent shingles, and may also lower your risk of PHN if you do get shingles.  What are the causes?  This condition is caused by damage to your nerves from the varicella-zoster virus. The damage makes your nerves overly sensitive.  What increases the risk?  The following factors may make you more likely to develop this condition:   Being older than 62 years of age.   Having severe pain before your shingles rash starts.   Having a severe rash.   Having shingles in and around the eye area.   Having a disease that makes your body unable to fight infections (weak immune system).  What are the signs or symptoms?  The main symptom of this condition is pain. The pain may:   Often be very bad and may be described as stabbing, burning, or feeling like an electric shock.   Come and go or may be there all the time.   Be triggered by light touches on the skin or changes in temperature.  You may have itching along with the pain.  How is this diagnosed?  This condition may be diagnosed based on your symptoms and your history of shingles. Lab studies and other diagnostic tests are usually not needed.  How is this treated?  There is no cure for this condition. Treatment for PHN will focus on pain relief.  Over-the-counter pain relievers do not usually relieve PHN pain. You may need to work with a pain specialist. Treatment may include:   Antidepressant medicines to help with pain and improve sleep.   Anti-seizure medicines to relieve nerve pain.   Strong pain relievers (opioids).   A numbing patch worn on the skin (lidocaine patch).   Botox (botulinum toxin) injections to block pain signals between nerves and muscles.   Injections of numbing medicine or anti-inflammatory medicines around irritated nerves.  Follow these instructions at home:     It may take a long time to recover from PHN. Work closely with your health care provider and develop a good support system at home.   Take over-the-counter and prescription medicines only as told by your health care provider.   Do not drive or use heavy machinery while taking prescription pain medicine.   Wear loose, comfortable clothing.   Cover sensitive areas with a dressing to reduce friction from clothing rubbing on the area.   If directed, put ice on the painful area:  ? Put ice in a plastic bag.  ? Place a towel between your skin and the bag.  ? Leave the ice on for 20 minutes, 2-3 times a day.   Talk to your health care provider if you feel depressed or desperate. Living with long-term pain can be depressing.   Keep all follow-up visits   as told by your health care provider. This is important.  Contact a health care provider if:   Your medicine is not helping.   You are struggling to manage your pain at home.  Summary   Postherpetic neuralgia is a very painful disorder that can occur after an episode of shingles.   The pain is often severe, burning, electric, or stabbing.   Prescription medicines can be helpful in managing persistent pain.   Getting a vaccination for shingles can prevent PHN. This vaccine is recommended for people older than 60.  This information is not intended to replace advice given to you by your health care provider. Make sure  you discuss any questions you have with your health care provider.  Document Released: 02/27/2003 Document Revised: 02/23/2017 Document Reviewed: 02/23/2017  Elsevier Interactive Patient Education  2019 [employer] Inc[employer]

## 2019-03-01 ENCOUNTER — Encounter: Payer: Self-pay | Admitting: Internal Medicine

## 2019-03-02 ENCOUNTER — Encounter: Payer: Self-pay | Admitting: Internal Medicine

## 2019-03-08 ENCOUNTER — Encounter: Payer: Self-pay | Admitting: Internal Medicine

## 2019-03-10 ENCOUNTER — Other Ambulatory Visit: Payer: Self-pay

## 2019-03-10 MED ORDER — GABAPENTIN 300 MG PO CAPS: 300.0000 mg | ORAL_CAPSULE | Freq: Three times a day (TID) | ORAL | 0 refills | Status: DC | PRN

## 2019-03-19 NOTE — Progress Notes (Signed)
Subjective:     Patient ID: Stephanie French , female    DOB: [birth year] , 63 y.o.   MRN: [id number]   Chief Complaint  Patient presents with  . Herpes Zoster    was dx with shingles on saturday , have on right side lower back going around to mid adb     HPI  She is here today for ER f/u. She was seen on 3/7 for further evaluation of flank pain and rash. She was diagnosed with shingles. She was discharged on valtrex and gabapentin.     Past Medical History:  Diagnosis Date  . Diabetes mellitus   . Hyperlipidemia   . Obesity      Family History  Problem Relation Age of Onset  . Diabetes Mother   . Hypertension Mother   . Cancer Other   . Hypertension Other   . Hyperlipidemia Other   . Stroke Other   . Heart attack Other   . Breast cancer Maternal Grandmother   . Early death Father      Current Outpatient Medications:  .  ibuprofen (ADVIL,MOTRIN) 600 MG tablet, TK 1 T PO  Q 6 H PRN P, Disp: , Rfl:  .  Semaglutide, 1 MG/DOSE, (OZEMPIC, 1 MG/DOSE,) 2 MG/1.5ML SOPN, Inject 1 mg into the skin once a week., Disp: 6 pen, Rfl: 1 .  acyclovir ointment (ZOVIRAX) 5 %, Apply thin layer to affected area up to four times daily, Disp: 30 g, Rfl: 0 .  gabapentin (NEURONTIN) 300 MG capsule, Take 1 capsule (300 mg total) by mouth 3 (three) times daily as needed., Disp: 30 capsule, Rfl: 0   Allergies  Allergen Reactions  . Eggs Or Egg-Derived Products     Allergic to the protein in the egg white  . Latex   . Penicillins      Review of Systems  Constitutional: Negative.   Respiratory: Negative.   Cardiovascular: Negative.   Gastrointestinal: Negative.   Neurological: Negative.   Psychiatric/Behavioral: Negative.      Today's Vitals   02/28/19 1036  BP: 126/84  Pulse: 76  Temp: 98 F (36.7 C)  TempSrc: Oral  SpO2: 94%  Weight: 242 lb (109.8 kg)   Body mass index is 41.54 kg/m.   Objective:  Physical Exam Vitals signs and nursing note reviewed.  Constitutional:     Appearance: Normal appearance.  HENT:     Head: Normocephalic and atraumatic.  Cardiovascular:     Rate and Rhythm: Normal rate and regular rhythm.     Heart sounds: Normal heart sounds.  Pulmonary:     Effort: Pulmonary effort is normal.     Breath sounds: Normal breath sounds.  Skin:    General: Skin is warm.     Findings: Rash present.     Comments: Vesicular rash in dermatomal pattern on the right. Starts at mid back and radiates toward her mid-abdomen.   Neurological:     General: No focal deficit present.     Mental Status: She is alert.  Psychiatric:        Mood and Affect: Mood normal.        Behavior: Behavior normal.         Assessment And Plan:     1. Herpes zoster without complication  ER records were reviewed in full detail. She was given rx zovirax ointment to apply to affected area twice daily. Pt advised pain should subside within the next week.  She will let me know  if her sx persist.   Maximino Greenland, MD

## 2019-04-04 ENCOUNTER — Ambulatory Visit: Payer: BLUE CROSS/BLUE SHIELD | Admitting: Internal Medicine

## 2019-04-04 ENCOUNTER — Encounter: Payer: Self-pay | Admitting: Internal Medicine

## 2019-04-04 ENCOUNTER — Other Ambulatory Visit: Payer: Self-pay

## 2019-04-04 VITALS — BP 116/78 | HR 72 | Temp 98.6°F | Ht 64.0 in | Wt 236.4 lb

## 2019-04-04 DIAGNOSIS — Z6841 Body Mass Index (BMI) 40.0 and over, adult: Secondary | ICD-10-CM

## 2019-04-04 DIAGNOSIS — E559 Vitamin D deficiency, unspecified: Secondary | ICD-10-CM

## 2019-04-04 DIAGNOSIS — E1165 Type 2 diabetes mellitus with hyperglycemia: Secondary | ICD-10-CM

## 2019-04-04 LAB — POCT URINALYSIS DIPSTICK
Bilirubin, UA: NEGATIVE
Glucose, UA: NEGATIVE
Ketones, UA: NEGATIVE
Leukocytes, UA: NEGATIVE
Nitrite, UA: NEGATIVE
Protein, UA: NEGATIVE
Spec Grav, UA: 1.01 (ref 1.010–1.025)
Urobilinogen, UA: 0.2 E.U./dL
pH, UA: 7 (ref 5.0–8.0)

## 2019-04-04 LAB — POCT UA - MICROALBUMIN
Albumin/Creatinine Ratio, Urine, POC: 30
Creatinine, POC: 50 mg/dL
Microalbumin Ur, POC: 10 mg/L

## 2019-04-04 NOTE — Progress Notes (Signed)
Subjective:     Patient ID: Stephanie French , female    DOB: 08-30-1956 , 63 y.o.   MRN: [id number]   Chief Complaint  Patient presents with  . Diabetes    HPI  Diabetes  She presents for her follow-up diabetic visit. She has type 2 diabetes mellitus. There are no hypoglycemic associated symptoms. Pertinent negatives for diabetes include no blurred vision and no chest pain. There are no hypoglycemic complications. Symptoms are stable. Risk factors for coronary artery disease include diabetes mellitus, obesity and post-menopausal. Her breakfast blood glucose is taken between 8-9 am. Her breakfast blood glucose range is generally 90-110 mg/dl. An ACE inhibitor/angiotensin II receptor blocker is not being taken.     Past Medical History:  Diagnosis Date  . Diabetes mellitus   . Hyperlipidemia   . Obesity      Family History  Problem Relation Age of Onset  . Diabetes Mother   . Hypertension Mother   . Cancer Other   . Hypertension Other   . Hyperlipidemia Other   . Stroke Other   . Heart attack Other   . Breast cancer Maternal Grandmother   . Early death Father      Current Outpatient Medications:  .  gabapentin (NEURONTIN) 300 MG capsule, Take 1 capsule (300 mg total) by mouth 3 (three) times daily as needed., Disp: 30 capsule, Rfl: 0 .  ibuprofen (ADVIL,MOTRIN) 600 MG tablet, TK 1 T PO  Q 6 H PRN P, Disp: , Rfl:  .  ONETOUCH VERIO test strip, USE BID QD TO CHECK BLOOD SUGAR BEFORE BREAKFAST AND DINNER, Disp: , Rfl:  .  Semaglutide, 1 MG/DOSE, (OZEMPIC, 1 MG/DOSE,) 2 MG/1.5ML SOPN, Inject 1 mg into the skin once a week., Disp: 6 pen, Rfl: 1   Allergies  Allergen Reactions  . Eggs Or Egg-Derived Products     Allergic to the protein in the egg white  . Latex   . Penicillins      Review of Systems  Constitutional: Negative.   Eyes: Negative for blurred vision.  Respiratory: Negative.   Cardiovascular: Negative.  Negative for chest pain.  Gastrointestinal: Negative.    Neurological: Negative.   Psychiatric/Behavioral: Negative.      Today's Vitals   04/04/19 0846  BP: 116/78  Pulse: 72  Temp: 98.6 F (37 C)  TempSrc: Oral  Weight: 236 lb 6.4 oz (107.2 kg)  Height: '5\' 4"'$  (1.626 m)   Body mass index is 40.58 kg/m.   Objective:  Physical Exam Vitals signs and nursing note reviewed.  Constitutional:      Appearance: Normal appearance.  HENT:     Head: Normocephalic and atraumatic.  Cardiovascular:     Rate and Rhythm: Normal rate and regular rhythm.     Heart sounds: Normal heart sounds.  Pulmonary:     Effort: Pulmonary effort is normal.     Breath sounds: Normal breath sounds.  Skin:    General: Skin is warm.  Neurological:     General: No focal deficit present.     Mental Status: She is alert.  Psychiatric:        Mood and Affect: Mood normal.        Behavior: Behavior normal.         Assessment And Plan:     1. Uncontrolled type 2 diabetes mellitus with hyperglycemia ([place])  I will check labs as listed below. She is encouraged to strive for five days of exercise per week. She  will rto in 3 months for re-evaluation. Importance of dietary, exercise and medication compliance was discussed with the patient.   - CMP14+EGFR - Hemoglobin A1c - Lipid panel - POCT Urinalysis Dipstick (81002) - POCT UA - Microalbumin  2. Vitamin D deficiency  I WILL CHECK A VIT D LEVEL AND SUPPLEMENT AS NEEDED.  ALSO ENCOURAGED TO SPEND 15 MINUTES IN THE SUN DAILY.  - Vitamin D (25 hydroxy)  3. Class 3 severe obesity due to excess calories with serious comorbidity and body mass index (BMI) of 40.0 to 44.9 in adult [facility])  She was congratulated on her six pound weight loss.  Importance of achieving optimal weight to decrease risk of cardiovascular disease and cancers was discussed with the patient in full detail. She is encouraged to start slowly - start with 10 minutes twice daily at least three to four days per week and to gradually build to 30  minutes five days weekly. She was given tips to incorporate more activity into her daily routine - take stairs when possible, park farther away from her job, grocery stores, etc.      Maximino Greenland, MD    THE PATIENT IS ENCOURAGED TO PRACTICE SOCIAL DISTANCING DUE TO THE COVID-19 PANDEMIC.

## 2019-04-04 NOTE — Patient Instructions (Signed)

## 2019-04-05 ENCOUNTER — Encounter: Payer: Self-pay | Admitting: Internal Medicine

## 2019-04-05 LAB — HEMOGLOBIN A1C
Est. average glucose Bld gHb Est-mCnc: 126 mg/dL
Hgb A1c MFr Bld: 6 % — ABNORMAL HIGH (ref 4.8–5.6)

## 2019-04-05 LAB — CMP14+EGFR
ALT: 12 IU/L (ref 0–32)
AST: 13 IU/L (ref 0–40)
Albumin/Globulin Ratio: 1.9 (ref 1.2–2.2)
Albumin: 4.3 g/dL (ref 3.8–4.8)
Alkaline Phosphatase: 79 IU/L (ref 39–117)
BUN/Creatinine Ratio: 20 (ref 12–28)
BUN: 18 mg/dL (ref 8–27)
Bilirubin Total: 0.3 mg/dL (ref 0.0–1.2)
CO2: 22 mmol/L (ref 20–29)
Calcium: 9.5 mg/dL (ref 8.7–10.3)
Chloride: 104 mmol/L (ref 96–106)
Creatinine, Ser: 0.89 mg/dL (ref 0.57–1.00)
GFR calc Af Amer: 81 mL/min/{1.73_m2} (ref >59)
GFR calc non Af Amer: 70 mL/min/{1.73_m2} (ref >59)
Globulin, Total: 2.3 g/dL (ref 1.5–4.5)
Glucose: 87 mg/dL (ref 65–99)
Potassium: 4.5 mmol/L (ref 3.5–5.2)
Sodium: 141 mmol/L (ref 134–144)
Total Protein: 6.6 g/dL (ref 6.0–8.5)

## 2019-04-05 LAB — VITAMIN D 25 HYDROXY (VIT D DEFICIENCY, FRACTURES): Vit D, 25-Hydroxy: 15.1 ng/mL — ABNORMAL LOW (ref 30.0–100.0)

## 2019-04-05 LAB — LIPID PANEL
Chol/HDL Ratio: 2.4 ratio (ref 0.0–4.4)
Cholesterol, Total: 182 mg/dL (ref 100–199)
HDL: 75 mg/dL (ref >39)
LDL Calculated: 97 mg/dL (ref 0–99)
Triglycerides: 49 mg/dL (ref 0–149)
VLDL Cholesterol Cal: 10 mg/dL (ref 5–40)

## 2019-04-20 ENCOUNTER — Ambulatory Visit: Payer: BLUE CROSS/BLUE SHIELD | Admitting: Cardiovascular Disease

## 2019-04-20 ENCOUNTER — Encounter: Payer: Self-pay | Admitting: *Deleted

## 2019-04-21 ENCOUNTER — Encounter: Payer: Self-pay | Admitting: Internal Medicine

## 2019-04-21 ENCOUNTER — Other Ambulatory Visit: Payer: Self-pay | Admitting: Internal Medicine

## 2019-04-26 ENCOUNTER — Telehealth: Payer: Self-pay | Admitting: Cardiovascular Disease

## 2019-04-27 ENCOUNTER — Telehealth: Payer: BLUE CROSS/BLUE SHIELD | Admitting: Cardiovascular Disease

## 2019-06-21 NOTE — Telephone Encounter (Signed)
Open n error °

## 2019-07-10 ENCOUNTER — Encounter: Payer: Self-pay | Admitting: Internal Medicine

## 2019-07-10 ENCOUNTER — Other Ambulatory Visit: Payer: Self-pay

## 2019-07-10 ENCOUNTER — Ambulatory Visit: Payer: BC Managed Care – PPO | Admitting: Internal Medicine

## 2019-07-10 VITALS — BP 118/78 | HR 89 | Temp 98.1°F | Ht 64.6 in | Wt 226.6 lb

## 2019-07-10 DIAGNOSIS — E6609 Other obesity due to excess calories: Secondary | ICD-10-CM | POA: Diagnosis not present

## 2019-07-10 DIAGNOSIS — E1165 Type 2 diabetes mellitus with hyperglycemia: Secondary | ICD-10-CM | POA: Diagnosis not present

## 2019-07-10 DIAGNOSIS — Z6838 Body mass index (BMI) 38.0-38.9, adult: Secondary | ICD-10-CM | POA: Diagnosis not present

## 2019-07-10 MED ORDER — OZEMPIC (1 MG/DOSE) 2 MG/1.5ML ~~LOC~~ SOPN: 1.0000 mg | PEN_INJECTOR | SUBCUTANEOUS | 3 refills | Status: DC

## 2019-07-10 NOTE — Progress Notes (Signed)
Subjective:     Patient ID: Stephanie French , female    DOB: [birth year] , 63 y.o.   MRN: [id number]   Chief Complaint  Patient presents with  . Diabetes    HPI  Diabetes She presents for her follow-up diabetic visit. She has type 2 diabetes mellitus. There are no hypoglycemic associated symptoms. Pertinent negatives for diabetes include no blurred vision and no chest pain. There are no hypoglycemic complications. Symptoms are stable. Risk factors for coronary artery disease include diabetes mellitus, obesity and post-menopausal. She is following a diabetic diet. Meal planning includes avoidance of concentrated sweets. She participates in exercise intermittently. Her breakfast blood glucose is taken between 8-9 am. Her breakfast blood glucose range is generally 90-110 mg/dl. An ACE inhibitor/angiotensin II receptor blocker is not being taken.     Past Medical History:  Diagnosis Date  . Diabetes mellitus   . Hyperlipidemia   . Obesity      Family History  Problem Relation Age of Onset  . Diabetes Mother   . Hypertension Mother   . Cancer Other   . Hypertension Other   . Hyperlipidemia Other   . Stroke Other   . Heart attack Other   . Breast cancer Maternal Grandmother   . Early death Father      Current Outpatient Medications:  .  ibuprofen (ADVIL,MOTRIN) 600 MG tablet, TK 1 T PO  Q 6 H PRN P, Disp: , Rfl:  .  ONETOUCH VERIO test strip, USE TWICE DAILY EVERY DAY TO CHECK BLOOD SUGAR BEFORE BREAKFAST AND DINNER, Disp: 100 each, Rfl: 3 .  Semaglutide, 1 MG/DOSE, (OZEMPIC, 1 MG/DOSE,) 2 MG/1.5ML SOPN, Inject 1 mg into the skin once a week., Disp: 3 pen, Rfl: 3   Allergies  Allergen Reactions  . Eggs Or Egg-Derived Products     Allergic to the protein in the egg white  . Latex   . Penicillins      Review of Systems  Constitutional: Negative.   Eyes: Negative for blurred vision.  Respiratory: Negative.   Cardiovascular: Negative.  Negative for chest pain.   Gastrointestinal: Negative.   Neurological: Negative.   Psychiatric/Behavioral: Negative.      Today's Vitals   07/10/19 1036  BP: 118/78  Pulse: 89  Temp: 98.1 F (36.7 C)  TempSrc: Oral  SpO2: 100%  Weight: 226 lb 9.6 oz (102.8 kg)  Height: 5' 4.6" (1.641 m)   Body mass index is 38.18 kg/m.   Objective:  Physical Exam Vitals signs and nursing note reviewed.  Constitutional:      Appearance: Normal appearance.  HENT:     Head: Normocephalic and atraumatic.  Cardiovascular:     Rate and Rhythm: Normal rate and regular rhythm.     Heart sounds: Normal heart sounds.  Pulmonary:     Effort: Pulmonary effort is normal.     Breath sounds: Normal breath sounds.  Skin:    General: Skin is warm.  Neurological:     General: No focal deficit present.     Mental Status: She is alert.  Psychiatric:        Mood and Affect: Mood normal.        Behavior: Behavior normal.         Assessment And Plan:     1. Uncontrolled type 2 diabetes mellitus with hyperglycemia ([place])  I will check labs as listed below. She is reminded to strive to exercise at least 150 minutes per week and to avoid  concentrated sweets. I will request copy of most recent eye exam as well.   - BMP8+EGFR - Hemoglobin A1c  2. Class 2 obesity due to excess calories without serious comorbidity with body mass index (BMI) of 38.0 to 38.9 in adult  She was congratulated on her ten pound weight loss and encouraged to keep up the great work. She is encouraged to strive to lose another ten pounds prior to her next visit.   Maximino Greenland, MD    THE PATIENT IS ENCOURAGED TO PRACTICE SOCIAL DISTANCING DUE TO THE COVID-19 PANDEMIC.

## 2019-07-11 LAB — BMP8+EGFR
BUN/Creatinine Ratio: 15 (ref 12–28)
BUN: 13 mg/dL (ref 8–27)
CO2: 19 mmol/L — ABNORMAL LOW (ref 20–29)
Calcium: 9.4 mg/dL (ref 8.7–10.3)
Chloride: 103 mmol/L (ref 96–106)
Creatinine, Ser: 0.84 mg/dL (ref 0.57–1.00)
GFR calc Af Amer: 87 mL/min/{1.73_m2} (ref >59)
GFR calc non Af Amer: 75 mL/min/{1.73_m2} (ref >59)
Glucose: 81 mg/dL (ref 65–99)
Potassium: 5.3 mmol/L — ABNORMAL HIGH (ref 3.5–5.2)
Sodium: 137 mmol/L (ref 134–144)

## 2019-07-11 LAB — HEMOGLOBIN A1C
Est. average glucose Bld gHb Est-mCnc: 126 mg/dL
Hgb A1c MFr Bld: 6 % — ABNORMAL HIGH (ref 4.8–5.6)

## 2019-07-27 ENCOUNTER — Encounter: Payer: Self-pay | Admitting: Internal Medicine

## 2019-08-31 ENCOUNTER — Other Ambulatory Visit: Payer: Self-pay

## 2019-08-31 ENCOUNTER — Encounter: Payer: Self-pay | Admitting: Cardiovascular Disease

## 2019-08-31 ENCOUNTER — Ambulatory Visit: Payer: BC Managed Care – PPO | Admitting: Cardiovascular Disease

## 2019-08-31 VITALS — BP 114/72 | HR 63 | Temp 97.0°F | Ht 64.0 in | Wt 225.4 lb

## 2019-08-31 DIAGNOSIS — R0789 Other chest pain: Secondary | ICD-10-CM | POA: Diagnosis not present

## 2019-08-31 DIAGNOSIS — R072 Precordial pain: Secondary | ICD-10-CM

## 2019-08-31 DIAGNOSIS — R0602 Shortness of breath: Secondary | ICD-10-CM

## 2019-08-31 HISTORY — DX: Other chest pain: R07.89

## 2019-08-31 HISTORY — DX: Shortness of breath: R06.02

## 2019-08-31 MED ORDER — METOPROLOL TARTRATE 25 MG PO TABS: ORAL_TABLET | ORAL | 0 refills | Status: DC

## 2019-08-31 NOTE — Progress Notes (Signed)
Cardiology Office Note   Date:  08/31/2019   ID:  LEXXUS ARROYAVE, DOB Oct 18, 1956, MRN [id number]  PCP:  Glendale Chard, MD  Cardiologist:   Skeet Latch, MD   No chief complaint on file.     History of Present Illness: Stephanie French is a 63 y.o. female with diabetes, hyperlipidemia, and obesity who is being seen today for the evaluation of shortness of breath at the request of Glendale Chard, MD.  Stephanie French notices that when she walks a long distance she gets short of breath.  The symptoms have been ongoing for about a year.  At one point she thought she was having a heart attack because it was associated with chest tightness.  However typically she does get short of breath.  She wants to increase her physical activity but is afraid.  She also sometimes feels like her heart is flipping.  She notes a fluttering sensation that occurs when she is lying in bed at night.  It last for seconds at a time and is not associated with chest pain or shortness of breath.  She sometimes has a little chest pressure when it occurs.  She denies lightheadedness or dizziness.  It never occurs with exertion.  Lately she finds her self waking up in the middle the night with diaphoresis around her neck. This has been occurring 2-3 nights per week for the last two weeks.  She also notes swelling in her R LE since March.  She had LE Dopplers that wer negative.  She denies orthopnea or PND.  She does not get much formal exercise but is pretty active at work.  She tends to be okay while working.  She quit smoking 6 years ago after an 8-pack-year history.  She notes that she does not sleep very well and that she snores.  She does not feel rested in the morning.  She had a sleep study 2 years ago at which time they told her she had very mild apnea and needed to lose some weight.  Her weight was heavier at that time the now.   Past Medical History:  Diagnosis Date  . Diabetes mellitus   . Hyperlipidemia   . Obesity      Past Surgical History:  Procedure Laterality Date  . OOPHORECTOMY     left ovary removed     Current Outpatient Medications  Medication Sig Dispense Refill  . ibuprofen (ADVIL,MOTRIN) 600 MG tablet TK 1 T PO  Q 6 H PRN P    . ONETOUCH VERIO test strip USE TWICE DAILY EVERY DAY TO CHECK BLOOD SUGAR BEFORE BREAKFAST AND DINNER 100 each 3  . Semaglutide, 1 MG/DOSE, (OZEMPIC, 1 MG/DOSE,) 2 MG/1.5ML SOPN Inject 1 mg into the skin once a week. 3 pen 3   No current facility-administered medications for this visit.     Allergies:   Eggs or egg-derived products, Latex, and Penicillins    Social History:  The patient  reports that she has quit smoking. Her smoking use included cigarettes. She has a 4.00 pack-year smoking history. She has never used smokeless tobacco. She reports that she does not drink alcohol or use drugs.   Family History:  The patient's family history includes Breast cancer in her maternal grandmother; Cancer in an other family member; Diabetes in her mother and sister; Early death in her father; Heart attack in her maternal grandmother, mother, and another family member; Hyperlipidemia in an other family member; Hypertension in her  mother, sister, and another family member; Lung cancer in her father; Stroke in her mother and another family member.    ROS:  Please see the history of present illness.   Otherwise, review of systems are positive for none.   All other systems are reviewed and negative.    PHYSICAL EXAM: VS:  BP 114/72   Pulse 63   Temp (!) 97 F (36.1 C) (Temporal)   Ht 5\' 4"  (1.626 m)   Wt 225 lb 6.4 oz (102.2 kg)   LMP 08/12/2012   SpO2 99%   BMI 38.69 kg/m  , BMI Body mass index is 38.69 kg/m. GENERAL:  Well appearing HEENT:  Pupils equal round and reactive, fundi not visualized, oral mucosa unremarkable NECK:  No jugular venous distention, waveform within normal limits, carotid upstroke brisk and symmetric, no bruits, no thyromegaly  LYMPHATICS:  No cervical adenopathy LUNGS:  Clear to auscultation bilaterally HEART:  RRR.  PMI not displaced or sustained,S1 and S2 within normal limits, no S3, no S4, no clicks, no rubs, no murmurs ABD:  Flat, positive bowel sounds normal in frequency in pitch, no bruits, no rebound, no guarding, no midline pulsatile mass, no hepatomegaly, no splenomegaly EXT:  2 plus pulses throughout, trace edema, no cyanosis no clubbing SKIN:  No rashes no nodules NEURO:  Cranial nerves II through XII grossly intact, motor grossly intact throughout PSYCH:  Cognitively intact, oriented to person place and time   EKG:  EKG is ordered today. The ekg ordered today demonstrates sinus rhythm.  Rate 63 bpm.     Recent Labs: 04/04/2019: ALT 12 07/10/2019: BUN 13; Creatinine, Ser 0.84; Potassium 5.3; Sodium 137    Lipid Panel    Component Value Date/Time   CHOL 182 04/04/2019 0935   TRIG 49 04/04/2019 0935   HDL 75 04/04/2019 0935   CHOLHDL 2.4 04/04/2019 0935   LDLCALC 97 04/04/2019 0935      Wt Readings from Last 3 Encounters:  08/31/19 225 lb 6.4 oz (102.2 kg)  07/10/19 226 lb 9.6 oz (102.8 kg)  04/04/19 236 lb 6.4 oz (107.2 kg)      ASSESSMENT AND PLAN:  # Atypical chest pain: # Shortness of breath: Stephanie French has exertional dyspnea.  Her risk factors for coronary disease include diabetes and obesity.  We will get a coronary CT-a to better assess her coronaries.  Given that she also has some very mild edema we will get an echocardiogram to look for other sources of her shortness of breath.    Current medicines are reviewed at length with the patient today.  The patient does not have concerns regarding medicines.  The following changes have been made:  no change  Labs/ tests ordered today include:  No orders of the defined types were placed in this encounter.    Disposition:   FU with Brandt Chaney C. Oval Linsey, MD, [facility] in 2 months.      Signed, Johngabriel Verde C. Oval Linsey, MD, [facility]   08/31/2019 3:32 PM    [place]

## 2019-08-31 NOTE — Patient Instructions (Addendum)
Medication Instructions:  TAKE METOPROLOL 25 MG 1 TABLET 2 HOURS PRIOR TO CT   If you need a refill on your cardiac medications before your next appointment, please call your pharmacy.   Lab work: BMET 1 WEEK PRIOR TO CARDIAC CT  If you have labs (blood work) drawn today and your tests are completely normal, you will receive your results only by: Marland Kitchen MyChart Message (if you have MyChart) OR . A paper copy in the mail If you have any lab test that is abnormal or we need to change your treatment, we will call you to review the results.  Testing/Procedures: Your physician has requested that you have an echocardiogram. Echocardiography is a painless test that uses sound waves to create images of your heart. It provides your doctor with information about the size and shape of your heart and how well your heart's chambers and valves are working. This procedure takes approximately one hour. There are no restrictions for this procedure. [address]  Your physician has requested that you have cardiac CT. Cardiac computed tomography (CT) is a painless test that uses an x-ray machine to take clear, detailed pictures of your heart. For further information please visit [email or web address]tn. Please follow instruction sheet as given. THE OFFICE WILL CALL YOU TO SCHEDULE ONCE YOUR INSURANCE HAS APPROVED. IF YOU DO NOT HEAR IN 2 WEEK CALL THE OFFICE AT [phone]  Follow-Up: At [facility], you and your health needs are our priority.  As part of our continuing mission to provide you with exceptional heart care, we have created designated Provider Care Teams.  These Care Teams include your primary Cardiologist (physician) and Advanced Practice Providers (APPs -  Physician Assistants and Nurse Practitioners) who all work together to provide you with the care you need, when you need it. You will need a follow up appointment in 2 months. You may see DR [facility] or one of the  following Advanced Practice Providers on your designated Care Team:   Kerin Ransom, PA-C Roby Lofts, [place] . Sande Rives, PA-C  Any Other Special Instructions Will Be Listed Below (If Applicable).  Your cardiac CT will be scheduled at one of the below locations:   [facility] [address] [place], [place] [address] [phone] [place] [address] [place], [place] [address] [phone]  If scheduled at [facility], please arrive at the [facility] main entrance of [facility] 30-45 minutes prior to test start time. Proceed to the [facility] Radiology Department (first floor) to check-in and test prep.  If scheduled at [facility], please arrive 15 mins early for check-in and test prep.  Please follow these instructions carefully (unless otherwise directed):  Hold all erectile dysfunction medications at least 5 days prior to test.  On the Night Before the Test: . Be sure to Drink plenty of water. . Do not consume any caffeinated/decaffeinated beverages or chocolate 12 hours prior to your test. . Do not take any antihistamines 12 hours prior to your test. . If the patient has contrast allergy: ? Patient will need a prescription for Prednisone and very clear instructions (as follows): 1. Prednisone 50 mg - take 13 hours prior to test 2. Take another Prednisone 50 mg 7 hours prior to test 3. Take another Prednisone 50 mg 1 hour prior to test 4. Take Benadryl 50 mg 1 hour prior to test .  Patient must complete all four doses of above prophylactic medications. . Patient will need a ride after test due to Benadryl.  On the Day of the Test: . Drink plenty of water. Do not drink any water within one hour of the test. . Do not eat any food 4 hours prior to the test. . You may take your regular medications prior to the test.  . Take metoprolol (Lopressor) two  hours prior to test. . HOLD Furosemide/Hydrochlorothiazide morning of the test. . FEMALES- please wear underwire-free bra if available      After the Test: . Drink plenty of water. . After receiving IV contrast, you may experience a mild flushed feeling. This is normal. . On occasion, you may experience a mild rash up to 24 hours after the test. This is not dangerous. If this occurs, you can take Benadryl 25 mg and increase your fluid intake. . If you experience trouble breathing, this can be serious. If it is severe call 911 IMMEDIATELY. If it is mild, please call our office. . If you take any of these medications: Glipizide/Metformin, Avandament, Glucavance, please do not take 48 hours after completing test unless otherwise instructed.    Please contact the cardiac imaging nurse navigator should you have any questions/concerns Marchia Bond, RN Navigator Cardiac Imaging [facility] [phone] Office  [phone] Cell    Cardiac CT Angiogram  A cardiac CT angiogram is a procedure to look at the heart and the area around the heart. It may be done to help find the cause of chest pains or other symptoms of heart disease. During this procedure, a large X-ray machine, called a CT scanner, takes detailed pictures of the heart and the surrounding area after a dye (contrast material) has been injected into blood vessels in the area. The procedure is also sometimes called a coronary CT angiogram, coronary artery scanning, or CTA. A cardiac CT angiogram allows the health care provider to see how well blood is flowing to and from the heart. The health care provider will be able to see if there are any problems, such as:  Blockage or narrowing of the coronary arteries in the heart.  Fluid around the heart.  Signs of weakness or disease in the muscles, valves, and tissues of the heart. Tell a health care provider about:  Any allergies you have. This is especially  important if you have had a previous allergic reaction to contrast dye.  All medicines you are taking, including vitamins, herbs, eye drops, creams, and over-the-counter medicines.  Any blood disorders you have.  Any surgeries you have had.  Any medical conditions you have.  Whether you are pregnant or may be pregnant.  Any anxiety disorders, chronic pain, or other conditions you have that may increase your stress or prevent you from lying still. What are the risks? Generally, this is a safe procedure. However, problems may occur, including:  Bleeding.  Infection.  Allergic reactions to medicines or dyes.  Damage to other structures or organs.  Kidney damage from the dye or contrast that is used.  Increased risk of cancer from radiation exposure. This risk is low. Talk with your health care provider about: ? The risks and benefits of testing. ? How you can receive the lowest dose of radiation. What happens before the procedure?  Wear comfortable clothing and remove any jewelry, glasses, dentures, and hearing aids.  Follow instructions from your health care provider about eating and drinking. This may include: ?  For 12 hours before the test - avoid caffeine. This includes tea, coffee, soda, energy drinks, and diet pills. Drink plenty of water or other fluids that do not have caffeine in them. Being well-hydrated can prevent complications. ? For 4-6 hours before the test - stop eating and drinking. The contrast dye can cause nausea, but this is less likely if your stomach is empty.  Ask your health care provider about changing or stopping your regular medicines. This is especially important if you are taking diabetes medicines, blood thinners, or medicines to treat erectile dysfunction. What happens during the procedure?  Hair on your chest may need to be removed so that small sticky patches called electrodes can be placed on your chest. These will transmit information that  helps to monitor your heart during the test.  An IV tube will be inserted into one of your veins.  You might be given a medicine to control your heart rate during the test. This will help to ensure that good images are obtained.  You will be asked to lie on an exam table. This table will slide in and out of the CT machine during the procedure.  Contrast dye will be injected into the IV tube. You might feel warm, or you may get a metallic taste in your mouth.  You will be given a medicine (nitroglycerin) to relax (dilate) the arteries in your heart.  The table that you are lying on will move into the CT machine tunnel for the scan.  The person running the machine will give you instructions while the scans are being done. You may be asked to: ? Keep your arms above your head. ? Hold your breath. ? Stay very still, even if the table is moving.  When the scanning is complete, you will be moved out of the machine.  The IV tube will be removed. The procedure may vary among health care providers and hospitals. What happens after the procedure?  You might feel warm, or you may get a metallic taste in your mouth from the contrast dye.  You may have a headache from the nitroglycerin.  After the procedure, drink water or other fluids to wash (flush) the contrast material out of your body.  Contact a health care provider if you have any symptoms of allergy to the contrast. These symptoms include: ? Shortness of breath. ? Rash or hives. ? A racing heartbeat.  Most people can return to their normal activities right after the procedure. Ask your health care provider what activities are safe for you.  It is up to you to get the results of your procedure. Ask your health care provider, or the department that is doing the procedure, when your results will be ready. Summary  A cardiac CT angiogram is a procedure to look at the heart and the area around the heart. It may be done to help find the  cause of chest pains or other symptoms of heart disease.  During this procedure, a large X-ray machine, called a CT scanner, takes detailed pictures of the heart and the surrounding area after a dye (contrast material) has been injected into blood vessels in the area.  Ask your health care provider about changing or stopping your regular medicines before the procedure. This is especially important if you are taking diabetes medicines, blood thinners, or medicines to treat erectile dysfunction.  After the procedure, drink water or other fluids to wash (flush) the contrast material out of your body. This  information is not intended to replace advice given to you by your health care provider. Make sure you discuss any questions you have with your health care provider. Document Released: 11/19/2008 Document Revised: 11/19/2017 Document Reviewed: 10/26/2016 Elsevier Patient Education  [place].   Echocardiogram An echocardiogram is a procedure that uses painless sound waves (ultrasound) to produce an image of the heart. Images from an echocardiogram can provide important information about:  Signs of coronary artery disease (CAD).  Aneurysm detection. An aneurysm is a weak or damaged part of an artery wall that bulges out from the normal force of blood pumping through the body.  Heart size and shape. Changes in the size or shape of the heart can be associated with certain conditions, including heart failure, aneurysm, and CAD.  Heart muscle function.  Heart valve function.  Signs of a past heart attack.  Fluid buildup around the heart.  Thickening of the heart muscle.  A tumor or infectious growth around the heart valves. Tell a health care provider about:  Any allergies you have.  All medicines you are taking, including vitamins, herbs, eye drops, creams, and over-the-counter medicines.  Any blood disorders you have.  Any surgeries you have had.  Any medical conditions  you have.  Whether you are pregnant or may be pregnant. What are the risks? Generally, this is a safe procedure. However, problems may occur, including:  Allergic reaction to dye (contrast) that may be used during the procedure. What happens before the procedure? No specific preparation is needed. You may eat and drink normally. What happens during the procedure?   An IV tube may be inserted into one of your veins.  You may receive contrast through this tube. A contrast is an injection that improves the quality of the pictures from your heart.  A gel will be applied to your chest.  A wand-like tool (transducer) will be moved over your chest. The gel will help to transmit the sound waves from the transducer.  The sound waves will harmlessly bounce off of your heart to allow the heart images to be captured in real-time motion. The images will be recorded on a computer. The procedure may vary among health care providers and hospitals. What happens after the procedure?  You may return to your normal, everyday life, including diet, activities, and medicines, unless your health care provider tells you not to do that. Summary  An echocardiogram is a procedure that uses painless sound waves (ultrasound) to produce an image of the heart.  Images from an echocardiogram can provide important information about the size and shape of your heart, heart muscle function, heart valve function, and fluid buildup around your heart.  You do not need to do anything to prepare before this procedure. You may eat and drink normally.  After the echocardiogram is completed, you may return to your normal, everyday life, unless your health care provider tells you not to do that. This information is not intended to replace advice given to you by your health care provider. Make sure you discuss any questions you have with your health care provider. Document Released: 12/04/2000 Document Revised: 03/30/2019  Document Reviewed: 01/09/2017 Elsevier Patient Education  2020 [employer].

## 2019-09-18 ENCOUNTER — Other Ambulatory Visit: Payer: Self-pay

## 2019-09-18 ENCOUNTER — Ambulatory Visit (HOSPITAL_COMMUNITY): Payer: BC Managed Care – PPO | Attending: Internal Medicine

## 2019-09-18 DIAGNOSIS — R0602 Shortness of breath: Secondary | ICD-10-CM

## 2019-09-18 DIAGNOSIS — R072 Precordial pain: Secondary | ICD-10-CM

## 2019-09-21 ENCOUNTER — Encounter: Payer: Self-pay | Admitting: Internal Medicine

## 2019-09-21 ENCOUNTER — Ambulatory Visit: Payer: BC Managed Care – PPO | Admitting: Internal Medicine

## 2019-09-21 ENCOUNTER — Other Ambulatory Visit: Payer: Self-pay

## 2019-09-21 VITALS — BP 122/68 | Temp 98.4°F | Ht 64.0 in | Wt 223.0 lb

## 2019-09-21 DIAGNOSIS — E1165 Type 2 diabetes mellitus with hyperglycemia: Secondary | ICD-10-CM

## 2019-09-21 DIAGNOSIS — Z0000 Encounter for general adult medical examination without abnormal findings: Secondary | ICD-10-CM

## 2019-09-21 DIAGNOSIS — E6609 Other obesity due to excess calories: Secondary | ICD-10-CM | POA: Diagnosis not present

## 2019-09-21 DIAGNOSIS — M461 Sacroiliitis, not elsewhere classified: Secondary | ICD-10-CM

## 2019-09-21 DIAGNOSIS — Z6838 Body mass index (BMI) 38.0-38.9, adult: Secondary | ICD-10-CM

## 2019-09-21 LAB — CMP14+EGFR
ALT: 14 IU/L (ref 0–32)
AST: 20 IU/L (ref 0–40)
Albumin/Globulin Ratio: 1.6 (ref 1.2–2.2)
Albumin: 4.1 g/dL (ref 3.8–4.8)
Alkaline Phosphatase: 86 IU/L (ref 39–117)
BUN/Creatinine Ratio: 18 (ref 12–28)
BUN: 15 mg/dL (ref 8–27)
Bilirubin Total: 0.3 mg/dL (ref 0.0–1.2)
CO2: 21 mmol/L (ref 20–29)
Calcium: 9.2 mg/dL (ref 8.7–10.3)
Chloride: 105 mmol/L (ref 96–106)
Creatinine, Ser: 0.83 mg/dL (ref 0.57–1.00)
GFR calc Af Amer: 88 mL/min/{1.73_m2} (ref >59)
GFR calc non Af Amer: 76 mL/min/{1.73_m2} (ref >59)
Globulin, Total: 2.5 g/dL (ref 1.5–4.5)
Glucose: 74 mg/dL (ref 65–99)
Potassium: 4.5 mmol/L (ref 3.5–5.2)
Sodium: 139 mmol/L (ref 134–144)
Total Protein: 6.6 g/dL (ref 6.0–8.5)

## 2019-09-21 LAB — CBC
Hematocrit: 38.2 % (ref 34.0–46.6)
Hemoglobin: 11.6 g/dL (ref 11.1–15.9)
MCH: 21.9 pg — ABNORMAL LOW (ref 26.6–33.0)
MCHC: 30.4 g/dL — ABNORMAL LOW (ref 31.5–35.7)
MCV: 72 fL — ABNORMAL LOW (ref 79–97)
Platelets: 226 10*3/uL (ref 150–450)
RBC: 5.3 x10E6/uL — ABNORMAL HIGH (ref 3.77–5.28)
RDW: 17.2 % — ABNORMAL HIGH (ref 11.7–15.4)
WBC: 4.5 10*3/uL (ref 3.4–10.8)

## 2019-09-21 LAB — POCT UA - MICROALBUMIN
Albumin/Creatinine Ratio, Urine, POC: <30
Creatinine, POC: 50 mg/dL
Microalbumin Ur, POC: 10 mg/L

## 2019-09-21 LAB — POCT URINALYSIS DIPSTICK
Bilirubin, UA: NEGATIVE
Glucose, UA: NEGATIVE
Ketones, UA: NEGATIVE
Leukocytes, UA: NEGATIVE
Nitrite, UA: NEGATIVE
Protein, UA: NEGATIVE
Spec Grav, UA: 1.015 (ref 1.010–1.025)
Urobilinogen, UA: 0.2 E.U./dL
pH, UA: 6 (ref 5.0–8.0)

## 2019-09-21 MED ORDER — SHINGRIX 50 MCG/0.5ML IM SUSR: 0.5000 mL | Freq: Once | INTRAMUSCULAR | 1 refills | Status: AC

## 2019-09-24 NOTE — Progress Notes (Signed)
Subjective:     Patient ID: Stephanie French , female    DOB: 08-07-1956 , 63 y.o.   MRN: [id number]   Chief Complaint  Patient presents with  . Annual Exam  . Diabetes    HPI  She is here today for a full physical examination. She has no specific concerns at this time.   Diabetes She presents for her follow-up diabetic visit. She has type 2 diabetes mellitus. There are no hypoglycemic associated symptoms. Pertinent negatives for diabetes include no blurred vision and no chest pain. There are no hypoglycemic complications. Risk factors for coronary artery disease include diabetes mellitus, dyslipidemia, obesity, post-menopausal and sedentary lifestyle. She participates in exercise intermittently.     Past Medical History:  Diagnosis Date  . Atypical chest pain 08/31/2019  . Diabetes mellitus   . Hyperlipidemia   . Obesity   . Shortness of breath 08/31/2019     Family History  Problem Relation Age of Onset  . Diabetes Mother   . Hypertension Mother   . Stroke Mother   . Heart attack Mother   . Cancer Other   . Hypertension Other   . Hyperlipidemia Other   . Stroke Other   . Heart attack Other   . Breast cancer Maternal Grandmother   . Heart attack Maternal Grandmother   . Early death Father   . Lung cancer Father   . Diabetes Sister   . Hypertension Sister      Current Outpatient Medications:  .  ibuprofen (ADVIL,MOTRIN) 600 MG tablet, TK 1 T PO  Q 6 H PRN P, Disp: , Rfl:  .  ONETOUCH VERIO test strip, USE TWICE DAILY EVERY DAY TO CHECK BLOOD SUGAR BEFORE BREAKFAST AND DINNER, Disp: 100 each, Rfl: 3 .  Semaglutide, 1 MG/DOSE, (OZEMPIC, 1 MG/DOSE,) 2 MG/1.5ML SOPN, Inject 1 mg into the skin once a week., Disp: 3 pen, Rfl: 3 .  metoprolol tartrate (LOPRESSOR) 25 MG tablet, TAKE 1 TABLET 2 HOURS PRIOR TO CT (Patient not taking: Reported on 09/21/2019), Disp: 1 tablet, Rfl: 0   Allergies  Allergen Reactions  . Eggs Or Egg-Derived Products     Allergic to the protein in  the egg white  . Latex   . Penicillins      The patient states she uses post menopausal status for birth control. Last LMP was Patient's last menstrual period was 08/12/2012.. Negative for Dysmenorrhea Negative for: breast discharge, breast lump(s), breast pain and breast self exam. Associated symptoms include abnormal vaginal bleeding. Pertinent negatives include abnormal bleeding (hematology), anxiety, decreased libido, depression, difficulty falling sleep, dyspareunia, history of infertility, nocturia, sexual dysfunction, sleep disturbances, urinary incontinence, urinary urgency, vaginal discharge and vaginal itching. Diet regular.The patient states her exercise level is  intermittent. She is awaiting clearance from cardiology to ramp up her exercise regimen.    . The patient's tobacco use is:  Social History   Tobacco Use  Smoking Status Former Smoker  . Packs/day: 0.10  . Years: 40.00  . Pack years: 4.00  . Types: Cigarettes  Smokeless Tobacco Never Used  . She has been exposed to passive smoke. The patient's alcohol use is:  Social History   Substance and Sexual Activity  Alcohol Use No    Review of Systems  Constitutional: Negative.   HENT: Negative.   Eyes: Negative.  Negative for blurred vision.  Respiratory: Negative.   Cardiovascular: Negative.  Negative for chest pain.  Endocrine: Negative.   Genitourinary: Negative.   Musculoskeletal:  Positive for back pain.       She reports having intermittent low back pain. Has been diagnosed with sacroiliitis in the past. She is not having any issues today.   Skin: Negative.   Allergic/Immunologic: Negative.   Neurological: Negative.   Hematological: Negative.   Psychiatric/Behavioral: Negative.      Today's Vitals   09/21/19 0857  BP: 122/68  Temp: 98.4 F (36.9 C)  TempSrc: Oral  SpO2: (!) 60%  Weight: 223 lb (101.2 kg)  Height: '5\' 4"'$  (1.626 m)   Body mass index is 38.28 kg/m.   Objective:  Physical  Exam Vitals signs and nursing note reviewed.  Constitutional:      Appearance: Normal appearance. She is obese.  HENT:     Head: Normocephalic and atraumatic.     Right Ear: Tympanic membrane, ear canal and external ear normal.     Left Ear: Tympanic membrane, ear canal and external ear normal.     Nose: Nose normal.     Mouth/Throat:     Mouth: Mucous membranes are moist.     Pharynx: Oropharynx is clear.  Eyes:     Extraocular Movements: Extraocular movements intact.     Conjunctiva/sclera: Conjunctivae normal.     Pupils: Pupils are equal, round, and reactive to light.  Neck:     Musculoskeletal: Normal range of motion and neck supple.  Cardiovascular:     Rate and Rhythm: Normal rate and regular rhythm.     Pulses: Normal pulses.          Dorsalis pedis pulses are 2+ on the right side and 2+ on the left side.     Heart sounds: Normal heart sounds.  Pulmonary:     Effort: Pulmonary effort is normal.     Breath sounds: Normal breath sounds.  Chest:     Breasts: Tanner Score is 5.        Right: Normal.        Left: Normal.  Abdominal:     General: Bowel sounds are normal.     Palpations: Abdomen is soft.     Comments: Obese, soft.   Genitourinary:    Comments: deferred Musculoskeletal: Normal range of motion.  Feet:     Right foot:     Protective Sensation: 5 sites tested. 5 sites sensed.     Skin integrity: Skin integrity normal.     Toenail Condition: Right toenails are normal.     Left foot:     Protective Sensation: 5 sites tested. 5 sites sensed.     Skin integrity: Skin integrity normal.     Toenail Condition: Left toenails are normal.  Skin:    General: Skin is warm and dry.  Neurological:     General: No focal deficit present.     Mental Status: She is alert and oriented to person, place, and time.  Psychiatric:        Mood and Affect: Mood normal.        Behavior: Behavior normal.         Assessment And Plan:     1. Encounter for annual physical  exam  A full exam was performed.  Importance of monthly self breast exams was discussed with the patient. PATIENT HAS BEEN ADVISED TO GET 30-45 MINUTES REGULAR EXERCISE NO LESS THAN FOUR TO FIVE DAYS PER WEEK - BOTH WEIGHTBEARING EXERCISES AND AEROBIC ARE RECOMMENDED.  SHE WAS ADVISED TO FOLLOW A HEALTHY DIET WITH AT LEAST SIX FRUITS/VEGGIES PER DAY,  DECREASE INTAKE OF RED MEAT, AND TO INCREASE FISH INTAKE TO TWO DAYS PER WEEK.  MEATS/FISH SHOULD NOT BE FRIED, BAKED OR BROILED IS PREFERABLE.  I SUGGEST WEARING SPF 50 SUNSCREEN ON EXPOSED PARTS AND ESPECIALLY WHEN IN THE DIRECT SUNLIGHT FOR AN EXTENDED PERIOD OF TIME.  PLEASE AVOID FAST FOOD RESTAURANTS AND INCREASE YOUR WATER INTAKE.  - POCT Urinalysis Dipstick (81002) - POCT UA - Microalbumin - CMP14+EGFR - CBC no Diff  2. Uncontrolled type 2 diabetes mellitus with hyperglycemia (HCC)  Diabetic foot exam was performed.  I DISCUSSED WITH THE PATIENT AT LENGTH REGARDING THE GOALS OF GLYCEMIC CONTROL AND POSSIBLE LONG-TERM COMPLICATIONS.  I  ALSO STRESSED THE IMPORTANCE OF COMPLIANCE WITH HOME GLUCOSE MONITORING, DIETARY RESTRICTIONS INCLUDING AVOIDANCE OF SUGARY DRINKS/PROCESSED FOODS,  ALONG WITH REGULAR EXERCISE.  I  ALSO STRESSED THE IMPORTANCE OF ANNUAL EYE EXAMS, SELF FOOT CARE AND COMPLIANCE WITH OFFICE VISITS.  - POCT Urinalysis Dipstick (81002) - POCT UA - Microalbumin  3. Sacroiliac inflammation (HCC)  Chronic, intermittent. She is encouraged to follow an anti-inflammatory diet free of processed foods and sugary beverages. She is advised to use lidocaine patches prn. She will let me know if she has recurrence of her symptoms.    4. Class 2 obesity due to excess calories without serious comorbidity with body mass index (BMI) of 38.0 to 38.9 in adult  Importance of achieving optimal weight to decrease risk of cardiovascular disease and cancers was discussed with the patient in full detail. She is encouraged to start slowly - start with  10 minutes twice daily at least three to four days per week and to gradually build to 30 minutes five days weekly. She was given tips to incorporate more activity into her daily routine - take stairs when possible, park farther away from her job, grocery stores, etc.    Maximino Greenland, MD    THE PATIENT IS ENCOURAGED TO PRACTICE SOCIAL DISTANCING DUE TO THE COVID-19 PANDEMIC.

## 2019-09-24 NOTE — Patient Instructions (Signed)
Health Maintenance, Female Adopting a healthy lifestyle and getting preventive care are important in promoting health and wellness. Ask your health care provider about:  The right schedule for you to have regular tests and exams.  Things you can do on your own to prevent diseases and keep yourself healthy. What should I know about diet, weight, and exercise? Eat a healthy diet   Eat a diet that includes plenty of vegetables, fruits, low-fat dairy products, and lean protein.  Do not eat a lot of foods that are high in solid fats, added sugars, or sodium. Maintain a healthy weight Body mass index (BMI) is used to identify weight problems. It estimates body fat based on height and weight. Your health care provider can help determine your BMI and help you achieve or maintain a healthy weight. Get regular exercise Get regular exercise. This is one of the most important things you can do for your health. Most adults should:  Exercise for at least 150 minutes each week. The exercise should increase your heart rate and make you sweat (moderate-intensity exercise).  Do strengthening exercises at least twice a week. This is in addition to the moderate-intensity exercise.  Spend less time sitting. Even light physical activity can be beneficial. Watch cholesterol and blood lipids Have your blood tested for lipids and cholesterol at 62 years of age, then have this test every 5 years. Have your cholesterol levels checked more often if:  Your lipid or cholesterol levels are high.  You are older than 62 years of age.  You are at high risk for heart disease. What should I know about cancer screening? Depending on your health history and family history, you may need to have cancer screening at various ages. This may include screening for:  Breast cancer.  Cervical cancer.  Colorectal cancer.  Skin cancer.  Lung cancer. What should I know about heart disease, diabetes, and high blood  pressure? Blood pressure and heart disease  High blood pressure causes heart disease and increases the risk of stroke. This is more likely to develop in people who have high blood pressure readings, are of African descent, or are overweight.  Have your blood pressure checked: ? Every 3-5 years if you are 18-39 years of age. ? Every year if you are 40 years old or older. Diabetes Have regular diabetes screenings. This checks your fasting blood sugar level. Have the screening done:  Once every three years after age 40 if you are at a normal weight and have a low risk for diabetes.  More often and at a younger age if you are overweight or have a high risk for diabetes. What should I know about preventing infection? Hepatitis B If you have a higher risk for hepatitis B, you should be screened for this virus. Talk with your health care provider to find out if you are at risk for hepatitis B infection. Hepatitis C Testing is recommended for:  Everyone born from 1945 through 1965.  Anyone with known risk factors for hepatitis C. Sexually transmitted infections (STIs)  Get screened for STIs, including gonorrhea and chlamydia, if: ? You are sexually active and are younger than 62 years of age. ? You are older than 62 years of age and your health care provider tells you that you are at risk for this type of infection. ? Your sexual activity has changed since you were last screened, and you are at increased risk for chlamydia or gonorrhea. Ask your health care provider if   you are at risk.  Ask your health care provider about whether you are at high risk for HIV. Your health care provider may recommend a prescription medicine to help prevent HIV infection. If you choose to take medicine to prevent HIV, you should first get tested for HIV. You should then be tested every 3 months for as long as you are taking the medicine. Pregnancy  If you are about to stop having your period (premenopausal) and  you may become pregnant, seek counseling before you get pregnant.  Take 400 to 800 micrograms (mcg) of folic acid every day if you become pregnant.  Ask for birth control (contraception) if you want to prevent pregnancy. Osteoporosis and menopause Osteoporosis is a disease in which the bones lose minerals and strength with aging. This can result in bone fractures. If you are 65 years old or older, or if you are at risk for osteoporosis and fractures, ask your health care provider if you should:  Be screened for bone loss.  Take a calcium or vitamin D supplement to lower your risk of fractures.  Be given hormone replacement therapy (HRT) to treat symptoms of menopause. Follow these instructions at home: Lifestyle  Do not use any products that contain nicotine or tobacco, such as cigarettes, e-cigarettes, and chewing tobacco. If you need help quitting, ask your health care provider.  Do not use street drugs.  Do not share needles.  Ask your health care provider for help if you need support or information about quitting drugs. Alcohol use  Do not drink alcohol if: ? Your health care provider tells you not to drink. ? You are pregnant, may be pregnant, or are planning to become pregnant.  If you drink alcohol: ? Limit how much you use to 0-1 drink a day. ? Limit intake if you are breastfeeding.  Be aware of how much alcohol is in your drink. In the [place] one drink equals one 12 oz bottle of beer (355 mL), one 5 oz glass of wine (148 mL), or one 1 oz glass of hard liquor (44 mL). General instructions  Schedule regular health, dental, and eye exams.  Stay current with your vaccines.  Tell your health care provider if: ? You often feel depressed. ? You have ever been abused or do not feel safe at home. Summary  Adopting a healthy lifestyle and getting preventive care are important in promoting health and wellness.  Follow your health care provider's instructions about healthy  diet, exercising, and getting tested or screened for diseases.  Follow your health care provider's instructions on monitoring your cholesterol and blood pressure. This information is not intended to replace advice given to you by your health care provider. Make sure you discuss any questions you have with your health care provider. Document Released: 06/22/2011 Document Revised: 11/30/2018 Document Reviewed: 11/30/2018 Elsevier Patient Education  2020 [employer]  

## 2019-10-03 LAB — BASIC METABOLIC PANEL
BUN/Creatinine Ratio: 15 (ref 12–28)
BUN: 13 mg/dL (ref 8–27)
CO2: 21 mmol/L (ref 20–29)
Calcium: 9.3 mg/dL (ref 8.7–10.3)
Chloride: 103 mmol/L (ref 96–106)
Creatinine, Ser: 0.85 mg/dL (ref 0.57–1.00)
GFR calc Af Amer: 86 mL/min/{1.73_m2} (ref >59)
GFR calc non Af Amer: 74 mL/min/{1.73_m2} (ref >59)
Glucose: 65 mg/dL (ref 65–99)
Potassium: 4.9 mmol/L (ref 3.5–5.2)
Sodium: 141 mmol/L (ref 134–144)

## 2019-10-04 ENCOUNTER — Telehealth (HOSPITAL_COMMUNITY): Payer: Self-pay | Admitting: Emergency Medicine

## 2019-10-04 NOTE — Telephone Encounter (Signed)
Left message on voicemail with name and callback number Demitrus Francisco RN Navigator Cardiac Imaging [facility] [phone] Office [phone] Cell  

## 2019-10-05 ENCOUNTER — Other Ambulatory Visit: Payer: Self-pay

## 2019-10-05 ENCOUNTER — Ambulatory Visit (HOSPITAL_COMMUNITY)
Admission: RE | Admit: 2019-10-05 | Discharge: 2019-10-05 | Disposition: A | Discharge status: Home / Self Care | Payer: BC Managed Care – PPO | Source: Ambulatory Visit | Attending: Cardiovascular Disease | Admitting: Cardiovascular Disease

## 2019-10-05 ENCOUNTER — Encounter (HOSPITAL_COMMUNITY): Payer: Self-pay

## 2019-10-05 DIAGNOSIS — R072 Precordial pain: Secondary | ICD-10-CM

## 2019-10-05 DIAGNOSIS — R0602 Shortness of breath: Secondary | ICD-10-CM | POA: Diagnosis present

## 2019-10-05 MED ORDER — IOHEXOL 350 MG/ML SOLN
80.0000 mL | Freq: Once | INTRAVENOUS | Status: AC | PRN
  Administered 2019-10-05: 80 mL via INTRAVENOUS

## 2019-10-05 MED ORDER — SODIUM CHLORIDE 0.9% FLUSH: 10.0000 mL | INTRAVENOUS | Status: DC | PRN

## 2019-10-05 MED ORDER — SODIUM CHLORIDE 0.9% FLUSH: 10.0000 mL | Freq: Two times a day (BID) | INTRAVENOUS | Status: DC

## 2019-10-05 MED ORDER — NITROGLYCERIN 0.4 MG SL SUBL
SUBLINGUAL_TABLET | SUBLINGUAL | Status: AC
  Administered 2019-10-05: 0.8 mg via SUBLINGUAL
  Filled 2019-10-05: qty 2

## 2019-10-05 MED ORDER — NITROGLYCERIN 0.4 MG SL SUBL
0.8000 mg | SUBLINGUAL_TABLET | Freq: Once | SUBLINGUAL | Status: AC
  Administered 2019-10-05: 14:00:00 0.8 mg via SUBLINGUAL

## 2019-10-13 ENCOUNTER — Other Ambulatory Visit: Payer: Self-pay | Admitting: *Deleted

## 2019-10-13 DIAGNOSIS — R918 Other nonspecific abnormal finding of lung field: Secondary | ICD-10-CM

## 2019-10-13 DIAGNOSIS — R911 Solitary pulmonary nodule: Secondary | ICD-10-CM

## 2019-11-09 ENCOUNTER — Encounter: Payer: Self-pay | Admitting: *Deleted

## 2019-11-09 ENCOUNTER — Telehealth: Payer: Self-pay | Admitting: *Deleted

## 2019-11-09 NOTE — Telephone Encounter (Signed)
Patient is schedule for office visit with Dr Oval Linsey 11/23 She is having virtual clinic Left detailed message, ok per DPR to call back and change to virtual or reschedule

## 2019-11-09 NOTE — Telephone Encounter (Signed)
Sent mychart message as well

## 2019-11-13 ENCOUNTER — Ambulatory Visit: Payer: BC Managed Care – PPO | Admitting: Cardiovascular Disease

## 2019-11-13 NOTE — Telephone Encounter (Signed)
Patient rescheduled to in office visit in December per patient request

## 2019-11-21 ENCOUNTER — Ambulatory Visit (INDEPENDENT_AMBULATORY_CARE_PROVIDER_SITE_OTHER): Payer: BC Managed Care – PPO | Admitting: Internal Medicine

## 2019-11-21 ENCOUNTER — Other Ambulatory Visit: Payer: Self-pay

## 2019-11-21 ENCOUNTER — Encounter: Payer: Self-pay | Admitting: Internal Medicine

## 2019-11-21 VITALS — BP 118/72 | HR 77 | Temp 98.5°F | Ht 64.0 in | Wt 228.2 lb

## 2019-11-21 DIAGNOSIS — Z1211 Encounter for screening for malignant neoplasm of colon: Secondary | ICD-10-CM

## 2019-11-21 DIAGNOSIS — Z23 Encounter for immunization: Secondary | ICD-10-CM | POA: Diagnosis not present

## 2019-11-21 DIAGNOSIS — E1165 Type 2 diabetes mellitus with hyperglycemia: Secondary | ICD-10-CM | POA: Diagnosis not present

## 2019-11-21 DIAGNOSIS — Z6839 Body mass index (BMI) 39.0-39.9, adult: Secondary | ICD-10-CM

## 2019-11-21 MED ORDER — PRAVASTATIN SODIUM 40 MG PO TABS: ORAL_TABLET | ORAL | 1 refills | Status: DC

## 2019-11-21 MED ORDER — OZEMPIC (1 MG/DOSE) 2 MG/1.5ML ~~LOC~~ SOPN: 1.0000 mg | PEN_INJECTOR | SUBCUTANEOUS | 3 refills | Status: DC

## 2019-11-21 NOTE — Patient Instructions (Signed)

## 2019-11-22 LAB — HEMOGLOBIN A1C
Est. average glucose Bld gHb Est-mCnc: 120 mg/dL
Hgb A1c MFr Bld: 5.8 % — ABNORMAL HIGH (ref 4.8–5.6)

## 2019-11-26 NOTE — Progress Notes (Signed)
This visit occurred during the SARS-CoV-2 public health emergency.  Safety protocols were in place, including screening questions prior to the visit, additional usage of staff PPE, and extensive cleaning of exam room while observing appropriate contact time as indicated for disinfecting solutions.  Subjective:     Patient ID: Stephanie French , female    DOB: 1956-10-08 , 63 y.o.   MRN: [id number]   Chief Complaint  Patient presents with  . Diabetes  . Immunizations    pneumonia    HPI  Diabetes She presents for her follow-up diabetic visit. She has type 2 diabetes mellitus. There are no hypoglycemic associated symptoms. Pertinent negatives for diabetes include no blurred vision and no chest pain. There are no hypoglycemic complications. Symptoms are stable. Risk factors for coronary artery disease include diabetes mellitus, obesity and post-menopausal. She is following a diabetic diet. Meal planning includes avoidance of concentrated sweets. She participates in exercise intermittently. Her breakfast blood glucose is taken between 8-9 am. Her breakfast blood glucose range is generally 90-110 mg/dl. An ACE inhibitor/angiotensin II receptor blocker is not being taken.     Past Medical History:  Diagnosis Date  . Atypical chest pain 08/31/2019  . Diabetes mellitus   . Hyperlipidemia   . Obesity   . Shortness of breath 08/31/2019     Family History  Problem Relation Age of Onset  . Diabetes Mother   . Hypertension Mother   . Stroke Mother   . Heart attack Mother   . Cancer Other   . Hypertension Other   . Hyperlipidemia Other   . Stroke Other   . Heart attack Other   . Breast cancer Maternal Grandmother   . Heart attack Maternal Grandmother   . Early death Father   . Lung cancer Father   . Diabetes Sister   . Hypertension Sister      Current Outpatient Medications:  .  ONETOUCH VERIO test strip, USE TWICE DAILY EVERY DAY TO CHECK BLOOD SUGAR BEFORE BREAKFAST AND DINNER,  Disp: 100 each, Rfl: 3 .  Semaglutide, 1 MG/DOSE, (OZEMPIC, 1 MG/DOSE,) 2 MG/1.5ML SOPN, Inject 1 mg into the skin once a week., Disp: 3 pen, Rfl: 3 .  ibuprofen (ADVIL,MOTRIN) 600 MG tablet, TK 1 T PO  Q 6 H PRN P, Disp: , Rfl:  .  pravastatin (PRAVACHOL) 40 MG tablet, One tab po MWF, Disp: 45 tablet, Rfl: 1   Allergies  Allergen Reactions  . Eggs Or Egg-Derived Products     Allergic to the protein in the egg white  . Latex   . Penicillins      Review of Systems  Constitutional: Negative.   Eyes: Negative for blurred vision.  Respiratory: Negative.   Cardiovascular: Negative.  Negative for chest pain.  Gastrointestinal: Negative.   Neurological: Negative.   Psychiatric/Behavioral: Negative.      Today's Vitals   11/21/19 1531  BP: 118/72  Pulse: 77  Temp: 98.5 F (36.9 C)  TempSrc: Oral  Weight: 228 lb 3.2 oz (103.5 kg)  Height: 5\' 4"  (1.626 m)   Body mass index is 39.17 kg/m.   Objective:  Physical Exam Vitals signs and nursing note reviewed.  Constitutional:      Appearance: Normal appearance.  HENT:     Head: Normocephalic and atraumatic.  Cardiovascular:     Rate and Rhythm: Normal rate and regular rhythm.     Heart sounds: Normal heart sounds.  Pulmonary:     Effort: Pulmonary effort is normal.  Breath sounds: Normal breath sounds.  Skin:    General: Skin is warm.  Neurological:     General: No focal deficit present.     Mental Status: She is alert.  Psychiatric:        Mood and Affect: Mood normal.        Behavior: Behavior normal.         Assessment And Plan:     1. Uncontrolled type 2 diabetes mellitus with hyperglycemia ([facility])  I will check a1c today. Previous renal labs reviewed in full detail. She is encouraged to avoid concentrated sweets and to increase her activity outside of work.   - Hemoglobin A1c  2. Immunization due  She was given pneumovax to update her immunization status.   3. Screen for colon cancer  She agrees to GI  referral for CRC screening.   - Ambulatory referral to Gastroenterology-[facility]    4. Class 2 severe obesity due to excess calories with serious comorbidity in adult, unspecified BMI (HCC)  Importance of achieving optimal weight to decrease risk of cardiovascular disease and cancers was discussed with the patient in full detail. She is encouraged to start slowly - start with 10 minutes twice daily at least three to four days per week and to gradually build to 30 minutes five days weekly. She was given tips to incorporate more activity into her daily routine - take stairs when possible, park farther away from her job, grocery stores, etc.   Maximino Greenland, MD    THE PATIENT IS ENCOURAGED TO PRACTICE SOCIAL DISTANCING DUE TO THE COVID-19 PANDEMIC.

## 2019-12-07 ENCOUNTER — Encounter: Payer: Self-pay | Admitting: Cardiovascular Disease

## 2019-12-07 ENCOUNTER — Other Ambulatory Visit: Payer: Self-pay

## 2019-12-07 ENCOUNTER — Ambulatory Visit: Payer: BC Managed Care – PPO | Admitting: Cardiovascular Disease

## 2019-12-07 VITALS — BP 136/62 | HR 67 | Temp 97.3°F | Ht 64.0 in | Wt 226.4 lb

## 2019-12-07 DIAGNOSIS — R0789 Other chest pain: Secondary | ICD-10-CM | POA: Diagnosis not present

## 2019-12-07 DIAGNOSIS — E7800 Pure hypercholesterolemia, unspecified: Secondary | ICD-10-CM | POA: Diagnosis not present

## 2019-12-07 HISTORY — DX: Pure hypercholesterolemia, unspecified: E78.00

## 2019-12-07 NOTE — Progress Notes (Signed)
Cardiology Office Note   Date:  12/07/2019   ID:  [name], DOB 08-24-1956, MRN [id number]  PCP:  Glendale Chard, MD  Cardiologist:   Skeet Latch, MD   No chief complaint on file.   History of Present Illness: Stephanie French is a 63 y.o. female with diabetes, hyperlipidemia, mild sleep apnea, and obesity here for follow up.  She was initially seen 08/2019 for an evaluation of shortness of breath.  Stephanie French noticed that when she walked a long distance she got short of breath.  At one point she thought she was having a heart attack because it was associated with chest tightness. She was referred for a coronary CT-A 09/2019 that had a calcium score of 0 and no CAD detected.  She had an echo 09/18/19 that revealed LVEF 65-70% and was otherwise normal.  Prior to that appointment she had LE Dopplers that were negative for DVT.  Since her last appointment Stephanie French has been well.  She has not been experiencing any chest pain lately.  She does not get much formal exercise but does do a lot of walking at work.  Her breathing has been stable.  She denies lower extremity edema, orthopnea, or PND.  She has been trying to lose 22 pounds by the time she sees Dr. Baird Cancer next month.  She tries to cook healthy at home.  However she does eat out some.  Overall she has been feeling well.  She was started on pravastatin 2 weeks ago and is tolerating it so far.   Past Medical History:  Diagnosis Date  . Atypical chest pain 08/31/2019  . Diabetes mellitus   . Hyperlipidemia   . Obesity   . Pure hypercholesterolemia 12/07/2019  . Shortness of breath 08/31/2019    Past Surgical History:  Procedure Laterality Date  . OOPHORECTOMY     left ovary removed     Current Outpatient Medications  Medication Sig Dispense Refill  . ibuprofen (ADVIL,MOTRIN) 600 MG tablet TK 1 T PO  Q 6 H PRN P    . ONETOUCH VERIO test strip USE TWICE DAILY EVERY DAY TO CHECK BLOOD SUGAR BEFORE BREAKFAST AND DINNER 100  each 3  . pravastatin (PRAVACHOL) 40 MG tablet One tab po MWF 45 tablet 1  . Semaglutide, 1 MG/DOSE, (OZEMPIC, 1 MG/DOSE,) 2 MG/1.5ML SOPN Inject 1 mg into the skin once a week. 3 pen 3   No current facility-administered medications for this visit.    Allergies:   Eggs or egg-derived products, Latex, and Penicillins    Social History:  The patient  reports that she has quit smoking. Her smoking use included cigarettes. She has a 4.00 pack-year smoking history. She has never used smokeless tobacco. She reports that she does not drink alcohol or use drugs.   Family History:  The patient's family history includes Breast cancer in her maternal grandmother; Cancer in an other family member; Diabetes in her mother and sister; Early death in her father; Heart attack in her maternal grandmother, mother, and another family member; Hyperlipidemia in an other family member; Hypertension in her mother, sister, and another family member; Lung cancer in her father; Stroke in her mother and another family member.    ROS:  Please see the history of present illness.   Otherwise, review of systems are positive for none.   All other systems are reviewed and negative.    PHYSICAL EXAM: VS:  BP 136/62   Pulse 67  Temp (!) 97.3 F (36.3 C)   Ht 5\' 4"  (1.626 m)   Wt 226 lb 6.4 oz (102.7 kg)   LMP 08/12/2012   SpO2 99%   BMI 38.86 kg/m  , BMI Body mass index is 38.86 kg/m. GENERAL:  Well appearing HEENT: Pupils equal round and reactive, fundi not visualized, oral mucosa unremarkable NECK:  No jugular venous distention, waveform within normal limits, carotid upstroke brisk and symmetric, no bruits LUNGS:  Clear to auscultation bilaterally HEART:  RRR.  PMI not displaced or sustained,S1 and S2 within normal limits, no S3, no S4, no clicks, no rubs, no murmurs ABD:  Flat, positive bowel sounds normal in frequency in pitch, no bruits, no rebound, no guarding, no midline pulsatile mass, no hepatomegaly, no  splenomegaly EXT:  2 plus pulses throughout, no edema, no cyanosis no clubbing SKIN:  No rashes no nodules NEURO:  Cranial nerves II through XII grossly intact, motor grossly intact throughout PSYCH:  Cognitively intact, oriented to person place and time   EKG:  EKG is not ordered today. The ekg ordered today demonstrates sinus rhythm.  Rate 63 bpm.    Coronary CT-A 10/05/19: IMPRESSION: 1. Coronary calcium score of 0. This was 0 percentile for age and sex matched control.  2. Normal coronary origin with right dominance.  3. No evidence of CAD.   Echo 09/18/19: IMPRESSIONS   1. Left ventricular ejection fraction, by visual estimation, is 65 to 70%. The left ventricle has normal function. Normal left ventricular size. There is no left ventricular hypertrophy.  2. Global right ventricle has normal systolic function.The right ventricular size is normal. No increase in right ventricular wall thickness.  3. Left atrial size was normal.  4. Right atrial size was normal.  5. The mitral valve is normal in structure. Trace mitral valve regurgitation.  6. The tricuspid valve is normal in structure. Tricuspid valve regurgitation is trivial.  7. The aortic valve is normal in structure. Aortic valve regurgitation was not visualized by color flow Doppler.  8. The pulmonic valve was grossly normal. Pulmonic valve regurgitation is trivial by color flow Doppler.  9. Normal pulmonary artery systolic pressure. 10. The inferior vena cava is normal in size with greater than 50% respiratory variability, suggesting right atrial pressure of 3 mmHg.  Recent Labs: 09/21/2019: ALT 14; Hemoglobin 11.6; Platelets 226 10/02/2019: BUN 13; Creatinine, Ser 0.85; Potassium 4.9; Sodium 141    Lipid Panel    Component Value Date/Time   CHOL 182 04/04/2019 0935   TRIG 49 04/04/2019 0935   HDL 75 04/04/2019 0935   CHOLHDL 2.4 04/04/2019 0935   LDLCALC 97 04/04/2019 0935      Wt Readings from Last 3  Encounters:  12/07/19 226 lb 6.4 oz (102.7 kg)  11/21/19 228 lb 3.2 oz (103.5 kg)  09/21/19 223 lb (101.2 kg)      ASSESSMENT AND PLAN:  # Atypical chest pain: # Shortness of breath: Symptoms have resolved.  Echo and coronary CT-A were unremarkable.  Her chest pain does not seem cardiac.  She was encouraged to increase her physical activity and work on weight loss as directed by Dr. Baird Cancer.  # Hyperlipidemia:  Continue pravastatin.   Current medicines are reviewed at length with the patient today.  The patient does not have concerns regarding medicines.  The following changes have been made:  no change  Labs/ tests ordered today include:  No orders of the defined types were placed in this encounter.  Disposition:   FU with Garry Bochicchio C. Oval Linsey, MD, [facility] as needed    Signed, [place] Oval Linsey, MD, [facility]  12/07/2019 6:11 PM    [place]

## 2019-12-07 NOTE — Patient Instructions (Signed)
Medication Instructions:  Your physician recommends that you continue on your current medications as directed. Please refer to the Current Medication list given to you today.   *If you need a refill on your cardiac medications before your next appointment, please call your pharmacy*  Lab Work: NONE  Testing/Procedures: NONE  Follow-Up: AS NEEDED    

## 2019-12-12 ENCOUNTER — Encounter: Payer: Self-pay | Admitting: Internal Medicine

## 2019-12-12 LAB — HM COLONOSCOPY

## 2019-12-20 ENCOUNTER — Encounter: Payer: Self-pay | Admitting: Internal Medicine

## 2020-01-02 ENCOUNTER — Other Ambulatory Visit: Payer: Self-pay

## 2020-01-02 ENCOUNTER — Other Ambulatory Visit: Payer: BC Managed Care – PPO

## 2020-01-02 DIAGNOSIS — E7800 Pure hypercholesterolemia, unspecified: Secondary | ICD-10-CM

## 2020-01-03 LAB — LIPID PANEL
Chol/HDL Ratio: 2.1 ratio (ref 0.0–4.4)
Cholesterol, Total: 187 mg/dL (ref 100–199)
HDL: 88 mg/dL (ref >39)
LDL Chol Calc (NIH): 87 mg/dL (ref 0–99)
Triglycerides: 62 mg/dL (ref 0–149)
VLDL Cholesterol Cal: 12 mg/dL (ref 5–40)

## 2020-01-04 ENCOUNTER — Encounter: Payer: Self-pay | Admitting: Internal Medicine

## 2020-01-04 ENCOUNTER — Ambulatory Visit (INDEPENDENT_AMBULATORY_CARE_PROVIDER_SITE_OTHER): Payer: BC Managed Care – PPO | Admitting: Internal Medicine

## 2020-01-04 ENCOUNTER — Other Ambulatory Visit: Payer: Self-pay

## 2020-01-04 VITALS — BP 114/82 | HR 59 | Temp 97.6°F | Ht 64.0 in | Wt 227.8 lb

## 2020-01-04 DIAGNOSIS — B029 Zoster without complications: Secondary | ICD-10-CM

## 2020-01-04 DIAGNOSIS — Z6839 Body mass index (BMI) 39.0-39.9, adult: Secondary | ICD-10-CM

## 2020-01-04 MED ORDER — GABAPENTIN 100 MG PO CAPS: 100.0000 mg | ORAL_CAPSULE | Freq: Every day | ORAL | 0 refills | Status: DC

## 2020-01-04 MED ORDER — VALACYCLOVIR HCL 1 G PO TABS: ORAL_TABLET | ORAL | 0 refills | Status: DC

## 2020-01-04 NOTE — Progress Notes (Signed)
This visit occurred during the SARS-CoV-2 public health emergency.  Safety protocols were in place, including screening questions prior to the visit, additional usage of staff PPE, and extensive cleaning of exam room while observing appropriate contact time as indicated for disinfecting solutions.  Subjective:     Patient ID: Stephanie French , female    DOB: 1956/09/30 , 64 y.o.   MRN: [id number]   Chief Complaint  Patient presents with  . Rash    under right breast    HPI  Rash This is a new problem. The current episode started in the past 7 days. The rash is characterized by blistering, burning and redness. She was exposed to nothing. Pertinent negatives include no congestion, cough or fatigue.     Past Medical History:  Diagnosis Date  . Atypical chest pain 08/31/2019  . Diabetes mellitus   . Hyperlipidemia   . Obesity   . Pure hypercholesterolemia 12/07/2019  . Shortness of breath 08/31/2019     Family History  Problem Relation Age of Onset  . Diabetes Mother   . Hypertension Mother   . Stroke Mother   . Heart attack Mother   . Cancer Other   . Hypertension Other   . Hyperlipidemia Other   . Stroke Other   . Heart attack Other   . Breast cancer Maternal Grandmother   . Heart attack Maternal Grandmother   . Early death Father   . Lung cancer Father   . Diabetes Sister   . Hypertension Sister      Current Outpatient Medications:  .  ibuprofen (ADVIL,MOTRIN) 600 MG tablet, TK 1 T PO  Q 6 H PRN P, Disp: , Rfl:  .  ONETOUCH VERIO test strip, USE TWICE DAILY EVERY DAY TO CHECK BLOOD SUGAR BEFORE BREAKFAST AND DINNER, Disp: 100 each, Rfl: 3 .  pravastatin (PRAVACHOL) 40 MG tablet, One tab po MWF, Disp: 45 tablet, Rfl: 1 .  Semaglutide, 1 MG/DOSE, (OZEMPIC, 1 MG/DOSE,) 2 MG/1.5ML SOPN, Inject 1 mg into the skin once a week., Disp: 3 pen, Rfl: 3 .  gabapentin (NEURONTIN) 100 MG capsule, Take 1 capsule (100 mg total) by mouth at bedtime., Disp: 90 capsule, Rfl: 0 .   valACYclovir (VALTREX) 1000 MG tablet, One tab po tid, Disp: 21 tablet, Rfl: 0   Allergies  Allergen Reactions  . Eggs Or Egg-Derived Products     Allergic to the protein in the egg white  . Latex   . Penicillins      Review of Systems  Constitutional: Negative.  Negative for fatigue.  HENT: Negative for congestion.   Respiratory: Negative.  Negative for cough.   Cardiovascular: Negative.   Gastrointestinal: Negative.   Skin: Positive for rash.  Neurological: Negative.   Psychiatric/Behavioral: Negative.      Today's Vitals   01/04/20 1031  BP: 114/82  Pulse: (!) 59  Temp: 97.6 F (36.4 C)  TempSrc: Oral  Weight: 227 lb 12.8 oz (103.3 kg)  Height: 5\' 4"  (1.626 m)   Body mass index is 39.1 kg/m.   Objective:  Physical Exam Vitals and nursing note reviewed.  Constitutional:      Appearance: Normal appearance.  HENT:     Head: Normocephalic and atraumatic.  Cardiovascular:     Rate and Rhythm: Normal rate and regular rhythm.     Heart sounds: Normal heart sounds.  Pulmonary:     Effort: Pulmonary effort is normal.     Breath sounds: Normal breath sounds.  Skin:  General: Skin is warm.     Comments: Erythematous rash underneath right breast with healed vesicles. The lesions are grouped together.   Neurological:     General: No focal deficit present.     Mental Status: She is alert.  Psychiatric:        Mood and Affect: Mood normal.        Behavior: Behavior normal.         Assessment And Plan:     1. Herpes zoster without complication  She was given rx Valtrex 1gm tid x 7 days. She is advised to get Shingrix vaccine in 90 days.   2. Class 2 severe obesity due to excess calories with serious comorbidity and body mass index (BMI) of 39.0 to 39.9 in adult ([place])    Wt Readings from Last 3 Encounters:  01/04/20 227 lb 12.8 oz (103.3 kg)  12/07/19 226 lb 6.4 oz (102.7 kg)  11/21/19 228 lb 3.2 oz (103.5 kg)   She is encouraged to strive for BMI less  than 32 to decrease cardiac risk. She is encouraged to incorporate more exercise into her daily routine, esp on her days off.   Maximino Greenland, MD    THE PATIENT IS ENCOURAGED TO PRACTICE SOCIAL DISTANCING DUE TO THE COVID-19 PANDEMIC.

## 2020-01-04 NOTE — Patient Instructions (Signed)
Shingles  Shingles, which is also known as herpes zoster, is an infection that causes a painful skin rash and fluid-filled blisters. It is caused by a virus. Shingles only develops in people who:  Have had chickenpox.  Have been given a medicine to protect against chickenpox (have been vaccinated). Shingles is rare in this group. What are the causes? Shingles is caused by varicella-zoster virus (VZV). This is the same virus that causes chickenpox. After a person is exposed to VZV, the virus stays in the body in an inactive (dormant) state. Shingles develops if the virus is reactivated. This can happen many years after the first (initial) exposure to VZV. It is not known what causes this virus to be reactivated. What increases the risk? People who have had chickenpox or received the chickenpox vaccine are at risk for shingles. Shingles infection is more common in people who:  Are older than age 60.  Have a weakened disease-fighting system (immune system), such as people with: ? HIV. ? AIDS. ? Cancer.  Are taking medicines that weaken the immune system, such as transplant medicines.  Are experiencing a lot of stress. What are the signs or symptoms? Early symptoms of this condition include itching, tingling, and pain in an area on your skin. Pain may be described as burning, stabbing, or throbbing. A few days or weeks after early symptoms start, a painful red rash appears. The rash is usually on one side of the body and has a band-like or belt-like pattern. The rash eventually turns into fluid-filled blisters that break open, change into scabs, and dry up in about 2-3 weeks. At any time during the infection, you may also develop:  A fever.  Chills.  A headache.  An upset stomach. How is this diagnosed? This condition is diagnosed with a skin exam. Skin or fluid samples may be taken from the blisters before a diagnosis is made. These samples are examined under a microscope or sent to  a lab for testing. How is this treated? The rash may last for several weeks. There is not a specific cure for this condition. Your health care provider will probably prescribe medicines to help you manage pain, recover more quickly, and avoid long-term problems. Medicines may include:  Antiviral drugs.  Anti-inflammatory drugs.  Pain medicines.  Anti-itching medicines (antihistamines). If the area involved is on your face, you may be referred to a specialist, such as an eye doctor (ophthalmologist) or an ear, nose, and throat (ENT) doctor (otolaryngologist) to help you avoid eye problems, chronic pain, or disability. Follow these instructions at home: Medicines  Take over-the-counter and prescription medicines only as told by your health care provider.  Apply an anti-itch cream or numbing cream to the affected area as told by your health care provider. Relieving itching and discomfort   Apply cold, wet cloths (cold compresses) to the area of the rash or blisters as told by your health care provider.  Cool baths can be soothing. Try adding baking soda or dry oatmeal to the water to reduce itching. Do not bathe in hot water. Blister and rash care  Keep your rash covered with a loose bandage (dressing). Wear loose-fitting clothing to help ease the pain of material rubbing against the rash.  Keep your rash and blisters clean by washing the area with mild soap and cool water as told by your health care provider.  Check your rash every day for signs of infection. Check for: ? More redness, swelling, or pain. ? Fluid   or blood. ? Warmth. ? Pus or a bad smell.  Do not scratch your rash or pick at your blisters. To help avoid scratching: ? Keep your fingernails clean and cut short. ? Wear gloves or mittens while you sleep, if scratching is a problem. General instructions  Rest as told by your health care provider.  Keep all follow-up visits as told by your health care provider. This  is important.  Wash your hands often with soap and water. If soap and water are not available, use hand sanitizer. Doing this lowers your chance of getting a bacterial skin infection.  Before your blisters change into scabs, your shingles infection can cause chickenpox in people who have never had it or have never been vaccinated against it. To prevent this from happening, avoid contact with other people, especially: ? Babies. ? Pregnant women. ? Children who have eczema. ? Elderly people who have transplants. ? People who have chronic illnesses, such as cancer or AIDS. Contact a health care provider if:  Your pain is not relieved with prescribed medicines.  Your pain does not get better after the rash heals.  You have signs of infection in the rash area, such as: ? More redness, swelling, or pain around the rash. ? Fluid or blood coming from the rash. ? The rash area feeling warm to the touch. ? Pus or a bad smell coming from the rash. Get help right away if:  The rash is on your face or nose.  You have facial pain, pain around your eye area, or loss of feeling on one side of your face.  You have difficulty seeing.  You have ear pain or have ringing in your ear.  You have a loss of taste.  Your condition gets worse. Summary  Shingles, which is also known as herpes zoster, is an infection that causes a painful skin rash and fluid-filled blisters.  This condition is diagnosed with a skin exam. Skin or fluid samples may be taken from the blisters and examined before the diagnosis is made.  Keep your rash covered with a loose bandage (dressing). Wear loose-fitting clothing to help ease the pain of material rubbing against the rash.  Before your blisters change into scabs, your shingles infection can cause chickenpox in people who have never had it or have never been vaccinated against it. This information is not intended to replace advice given to you by your health care  provider. Make sure you discuss any questions you have with your health care provider. Document Revised: 03/31/2019 Document Reviewed: 08/11/2017 Elsevier Patient Education  2020 [employer] Inc[employer]  

## 2020-01-06 ENCOUNTER — Encounter: Payer: Self-pay | Admitting: Internal Medicine

## 2020-02-19 ENCOUNTER — Encounter: Payer: Self-pay | Admitting: Internal Medicine

## 2020-02-19 ENCOUNTER — Ambulatory Visit (INDEPENDENT_AMBULATORY_CARE_PROVIDER_SITE_OTHER): Payer: BC Managed Care – PPO | Admitting: Internal Medicine

## 2020-02-19 ENCOUNTER — Other Ambulatory Visit: Payer: Self-pay

## 2020-02-19 VITALS — BP 124/82 | HR 81 | Temp 98.5°F | Ht 64.0 in | Wt 231.4 lb

## 2020-02-19 DIAGNOSIS — E559 Vitamin D deficiency, unspecified: Secondary | ICD-10-CM

## 2020-02-19 DIAGNOSIS — Z6839 Body mass index (BMI) 39.0-39.9, adult: Secondary | ICD-10-CM | POA: Diagnosis not present

## 2020-02-19 DIAGNOSIS — E1165 Type 2 diabetes mellitus with hyperglycemia: Secondary | ICD-10-CM | POA: Diagnosis not present

## 2020-02-19 NOTE — Patient Instructions (Signed)
Diabetes Mellitus and Exercise Exercising regularly is important for your overall health, especially when you have diabetes (diabetes mellitus). Exercising is not only about losing weight. It has many other health benefits, such as increasing muscle strength and bone density and reducing body fat and stress. This leads to improved fitness, flexibility, and endurance, all of which result in better overall health. Exercise has additional benefits for people with diabetes, including:  Reducing appetite.  Helping to lower and control blood glucose.  Lowering blood pressure.  Helping to control amounts of fatty substances (lipids) in the blood, such as cholesterol and triglycerides.  Helping the body to respond better to insulin (improving insulin sensitivity).  Reducing how much insulin the body needs.  Decreasing the risk for heart disease by: ? Lowering cholesterol and triglyceride levels. ? Increasing the levels of good cholesterol. ? Lowering blood glucose levels. What is my activity plan? Your health care provider or certified diabetes educator can help you make a plan for the type and frequency of exercise (activity plan) that works for you. Make sure that you:  Do at least 150 minutes of moderate-intensity or vigorous-intensity exercise each week. This could be brisk walking, biking, or water aerobics. ? Do stretching and strength exercises, such as yoga or weightlifting, at least 2 times a week. ? Spread out your activity over at least 3 days of the week.  Get some form of physical activity every day. ? Do not go more than 2 days in a row without some kind of physical activity. ? Avoid being inactive for more than 30 minutes at a time. Take frequent breaks to walk or stretch.  Choose a type of exercise or activity that you enjoy, and set realistic goals.  Start slowly, and gradually increase the intensity of your exercise over time. What do I need to know about managing my  diabetes?   Check your blood glucose before and after exercising. ? If your blood glucose is 240 mg/dL (13.3 mmol/L) or higher before you exercise, check your urine for ketones. If you have ketones in your urine, do not exercise until your blood glucose returns to normal. ? If your blood glucose is 100 mg/dL (5.6 mmol/L) or lower, eat a snack containing 15-20 grams of carbohydrate. Check your blood glucose 15 minutes after the snack to make sure that your level is above 100 mg/dL (5.6 mmol/L) before you start your exercise.  Know the symptoms of low blood glucose (hypoglycemia) and how to treat it. Your risk for hypoglycemia increases during and after exercise. Common symptoms of hypoglycemia can include: ? Hunger. ? Anxiety. ? Sweating and feeling clammy. ? Confusion. ? Dizziness or feeling light-headed. ? Increased heart rate or palpitations. ? Blurry vision. ? Tingling or numbness around the mouth, lips, or tongue. ? Tremors or shakes. ? Irritability.  Keep a rapid-acting carbohydrate snack available before, during, and after exercise to help prevent or treat hypoglycemia.  Avoid injecting insulin into areas of the body that are going to be exercised. For example, avoid injecting insulin into: ? The arms, when playing tennis. ? The legs, when jogging.  Keep records of your exercise habits. Doing this can help you and your health care provider adjust your diabetes management plan as needed. Write down: ? Food that you eat before and after you exercise. ? Blood glucose levels before and after you exercise. ? The type and amount of exercise you have done. ? When your insulin is expected to peak, if you use   insulin. Avoid exercising at times when your insulin is peaking.  When you start a new exercise or activity, work with your health care provider to make sure the activity is safe for you, and to adjust your insulin, medicines, or food intake as needed.  Drink plenty of water while  you exercise to prevent dehydration or heat stroke. Drink enough fluid to keep your urine clear or pale yellow. Summary  Exercising regularly is important for your overall health, especially when you have diabetes (diabetes mellitus).  Exercising has many health benefits, such as increasing muscle strength and bone density and reducing body fat and stress.  Your health care provider or certified diabetes educator can help you make a plan for the type and frequency of exercise (activity plan) that works for you.  When you start a new exercise or activity, work with your health care provider to make sure the activity is safe for you, and to adjust your insulin, medicines, or food intake as needed. This information is not intended to replace advice given to you by your health care provider. Make sure you discuss any questions you have with your health care provider. Document Revised: 07/01/2017 Document Reviewed: 05/18/2016 Elsevier Patient Education  2020 [employer] Inc.  

## 2020-02-19 NOTE — Progress Notes (Signed)
This visit occurred during the SARS-CoV-2 public health emergency.  Safety protocols were in place, including screening questions prior to the visit, additional usage of staff PPE, and extensive cleaning of exam room while observing appropriate contact time as indicated for disinfecting solutions.  Subjective:     Patient ID: Stephanie French , female    DOB: 07/25/1956 , 64 y.o.   MRN: [id number]   Chief Complaint  Patient presents with  . Diabetes    HPI  Diabetes She presents for her follow-up diabetic visit. She has type 2 diabetes mellitus. There are no hypoglycemic associated symptoms. Pertinent negatives for diabetes include no blurred vision and no chest pain. There are no hypoglycemic complications. Symptoms are stable. Risk factors for coronary artery disease include diabetes mellitus, obesity and post-menopausal. She is following a diabetic diet. Meal planning includes avoidance of concentrated sweets. She participates in exercise intermittently. Her breakfast blood glucose is taken between 8-9 am. Her breakfast blood glucose range is generally 90-110 mg/dl. An ACE inhibitor/angiotensin II receptor blocker is not being taken.     Past Medical History:  Diagnosis Date  . Atypical chest pain 08/31/2019  . Diabetes mellitus   . Hyperlipidemia   . Obesity   . Pure hypercholesterolemia 12/07/2019  . Shortness of breath 08/31/2019     Family History  Problem Relation Age of Onset  . Diabetes Mother   . Hypertension Mother   . Stroke Mother   . Heart attack Mother   . Cancer Other   . Hypertension Other   . Hyperlipidemia Other   . Stroke Other   . Heart attack Other   . Breast cancer Maternal Grandmother   . Heart attack Maternal Grandmother   . Early death Father   . Lung cancer Father   . Diabetes Sister   . Hypertension Sister      Current Outpatient Medications:  .  gabapentin (NEURONTIN) 100 MG capsule, Take 1 capsule (100 mg total) by mouth at bedtime., Disp:  90 capsule, Rfl: 0 .  ibuprofen (ADVIL,MOTRIN) 600 MG tablet, TK 1 T PO  Q 6 H PRN P, Disp: , Rfl:  .  ONETOUCH VERIO test strip, USE TWICE DAILY EVERY DAY TO CHECK BLOOD SUGAR BEFORE BREAKFAST AND DINNER, Disp: 100 each, Rfl: 3 .  pravastatin (PRAVACHOL) 40 MG tablet, One tab po MWF, Disp: 45 tablet, Rfl: 1 .  Semaglutide, 1 MG/DOSE, (OZEMPIC, 1 MG/DOSE,) 2 MG/1.5ML SOPN, Inject 1 mg into the skin once a week., Disp: 3 pen, Rfl: 3   Allergies  Allergen Reactions  . Eggs Or Egg-Derived Products     Allergic to the protein in the egg white  . Latex   . Penicillins      Review of Systems  Constitutional: Negative.   Eyes: Negative for blurred vision.  Respiratory: Negative.   Cardiovascular: Negative.  Negative for chest pain.  Gastrointestinal: Negative.   Neurological: Negative.   Psychiatric/Behavioral: Negative.      Today's Vitals   02/19/20 1438  BP: 124/82  Pulse: 81  Temp: 98.5 F (36.9 C)  TempSrc: Oral  Weight: 231 lb 6.4 oz (105 kg)  Height: _0  (1.626 m)   Body mass index is 39.72 kg/m.   Objective:  Physical Exam Vitals and nursing note reviewed.  Constitutional:      Appearance: Normal appearance. She is obese.  HENT:     Head: Normocephalic and atraumatic.  Cardiovascular:     Rate and Rhythm: Normal rate and  regular rhythm.     Heart sounds: Normal heart sounds.  Pulmonary:     Effort: Pulmonary effort is normal.     Breath sounds: Normal breath sounds.  Skin:    General: Skin is warm.  Neurological:     General: No focal deficit present.     Mental Status: She is alert.  Psychiatric:        Mood and Affect: Mood normal.        Behavior: Behavior normal.         Assessment And Plan:     1. Uncontrolled type 2 diabetes mellitus with hyperglycemia (HCC)  Chronic, I will check labs as listed below.  I will make further recommendations once her labs are available for review.   - Hemoglobin A1c - BMP8+EGFR  2. Vitamin D deficiency  I  WILL CHECK A VIT D LEVEL AND SUPPLEMENT AS NEEDED.  ALSO ENCOURAGED TO SPEND 15 MINUTES IN THE SUN DAILY.  - Vitamin D (25 hydroxy)  3. Class 2 severe obesity due to excess calories with serious comorbidity and body mass index (BMI) of 39.0 to 39.9 in adult [facility])  She is encouraged to lose ten pounds prior to her next visit. She is advised to aim for at least 150 minutes of exercise per week.   Maximino Greenland, MD    THE PATIENT IS ENCOURAGED TO PRACTICE SOCIAL DISTANCING DUE TO THE COVID-19 PANDEMIC.

## 2020-02-20 LAB — VITAMIN D 25 HYDROXY (VIT D DEFICIENCY, FRACTURES): Vit D, 25-Hydroxy: 17.8 ng/mL — ABNORMAL LOW (ref 30.0–100.0)

## 2020-02-20 LAB — BMP8+EGFR
BUN/Creatinine Ratio: 16 (ref 12–28)
BUN: 17 mg/dL (ref 8–27)
CO2: 19 mmol/L — ABNORMAL LOW (ref 20–29)
Calcium: 9.7 mg/dL (ref 8.7–10.3)
Chloride: 105 mmol/L (ref 96–106)
Creatinine, Ser: 1.06 mg/dL — ABNORMAL HIGH (ref 0.57–1.00)
GFR calc Af Amer: 65 mL/min/{1.73_m2} (ref >59)
GFR calc non Af Amer: 56 mL/min/{1.73_m2} — ABNORMAL LOW (ref >59)
Glucose: 89 mg/dL (ref 65–99)
Potassium: 4.3 mmol/L (ref 3.5–5.2)
Sodium: 143 mmol/L (ref 134–144)

## 2020-02-20 LAB — HEMOGLOBIN A1C
Est. average glucose Bld gHb Est-mCnc: 120 mg/dL
Hgb A1c MFr Bld: 5.8 % — ABNORMAL HIGH (ref 4.8–5.6)

## 2020-02-21 ENCOUNTER — Other Ambulatory Visit: Payer: Self-pay

## 2020-02-21 MED ORDER — VITAMIN D (ERGOCALCIFEROL) 1.25 MG (50000 UNIT) PO CAPS: ORAL_CAPSULE | ORAL | 1 refills | Status: DC

## 2020-05-16 ENCOUNTER — Telehealth: Payer: Self-pay

## 2020-05-16 NOTE — Telephone Encounter (Signed)
PT CALLED TO COME PICK UP SAMPLE OF OZEMPIC. PA SENT TO PLAN FOR OZEMPIC

## 2020-06-20 ENCOUNTER — Ambulatory Visit (INDEPENDENT_AMBULATORY_CARE_PROVIDER_SITE_OTHER): Payer: BC Managed Care – PPO | Admitting: Internal Medicine

## 2020-06-20 ENCOUNTER — Encounter: Payer: Self-pay | Admitting: Internal Medicine

## 2020-06-20 ENCOUNTER — Other Ambulatory Visit: Payer: Self-pay | Admitting: Internal Medicine

## 2020-06-20 ENCOUNTER — Other Ambulatory Visit: Payer: Self-pay

## 2020-06-20 VITALS — BP 118/74 | HR 78 | Temp 98.7°F | Ht 64.0 in | Wt 232.6 lb

## 2020-06-20 DIAGNOSIS — E1165 Type 2 diabetes mellitus with hyperglycemia: Secondary | ICD-10-CM | POA: Diagnosis not present

## 2020-06-20 DIAGNOSIS — Z1231 Encounter for screening mammogram for malignant neoplasm of breast: Secondary | ICD-10-CM | POA: Diagnosis not present

## 2020-06-20 DIAGNOSIS — M5416 Radiculopathy, lumbar region: Secondary | ICD-10-CM | POA: Diagnosis not present

## 2020-06-20 MED ORDER — CYCLOBENZAPRINE HCL 10 MG PO TABS: 10.0000 mg | ORAL_TABLET | Freq: Every day | ORAL | 0 refills | Status: DC

## 2020-06-20 NOTE — Patient Instructions (Signed)
Lumbosacral Radiculopathy Lumbosacral radiculopathy is a condition that involves the spinal nerves and nerve roots in the low back and bottom of the spine. The condition develops when these nerves and nerve roots move out of place or become inflamed and cause symptoms. What are the causes? This condition may be caused by:  Pressure from a disk that bulges out of place (herniated disk). A disk is a plate of soft cartilage that separates bones in the spine.  Disk changes that occur with age (disk degeneration).  A narrowing of the bones of the lower back (spinal stenosis).  A tumor.  An infection.  An injury that places sudden pressure on the disks that cushion the bones of your lower spine. What increases the risk? You are more likely to develop this condition if:  You are a female who is 30-50 years old.  You are a female who is 50-60 years old.  You use improper technique when lifting things.  You are overweight or live a sedentary lifestyle.  Your work requires frequent lifting.  You smoke.  You do repetitive activities that strain the spine. What are the signs or symptoms? Symptoms of this condition include:  Pain that goes down from your back into your legs (sciatica), usually on one side of the body. This is the most common symptom. The pain may be worse with sitting, coughing, or sneezing.  Pain and numbness in your legs.  Muscle weakness.  Tingling.  Loss of bladder control or bowel control. How is this diagnosed? This condition may be diagnosed based on:  Your symptoms and medical history.  A physical exam. If the pain is lasting, you may have tests, such as:  MRI scan.  X-ray.  CT scan.  A type of X-ray used to examine the spinal canal after injecting a dye into your spine (myelogram).  A test to measure how electrical impulses move through a nerve (nerve conduction study). How is this treated? Treatment may depend on the cause of the condition and  may include:  Working with a physical therapist.  Taking pain medicine.  Applying heat and ice to affected areas.  Doing stretches to improve flexibility.  Doing exercises to strengthen back muscles.  Having chiropractic spinal manipulation.  Using transcutaneous electrical nerve stimulation (TENS) therapy.  Getting a steroid injection in the spine. In some cases, no treatment is needed. If the condition is long-lasting (chronic), or if symptoms are severe, treatment may involve surgery or lifestyle changes, such as following a weight-loss plan. Follow these instructions at home: Activity  Avoid bending and other activities that make the problem worse.  Maintain a proper position when standing or sitting: ? When standing, keep your upper back and neck straight, with your shoulders pulled back. Avoid slouching. ? When sitting, keep your back straight and relax your shoulders. Do not round your shoulders or pull them backward.  Do not sit or stand in one place for long periods of time.  Take brief periods of rest throughout the day. This will reduce your pain. It is usually better to rest by lying down or standing, not sitting.  When you are resting for longer periods, mix in some mild activity or stretching between periods of rest. This will help to prevent stiffness and pain.  Get regular exercise. Ask your health care provider what activities are safe for you. If you were shown how to do any exercises or stretches, do them as directed by your health care provider.  Do   not lift anything that is heavier than 10 lb (4.5 kg) or the limit that you are told by your health care provider. Always use proper lifting technique, which includes: ? Bending your knees. ? Keeping the load close to your body. ? Avoiding twisting. Managing pain  If directed, put ice on the affected area: ? Put ice in a plastic bag. ? Place a towel between your skin and the bag. ? Leave the ice on for 20  minutes, 2-3 times a day.  If directed, apply heat to the affected area as often as told by your health care provider. Use the heat source that your health care provider recommends, such as a moist heat pack or a heating pad. ? Place a towel between your skin and the heat source. ? Leave the heat on for 20-30 minutes. ? Remove the heat if your skin turns bright red. This is especially important if you are unable to feel pain, heat, or cold. You may have a greater risk of getting burned.  Take over-the-counter and prescription medicines only as told by your health care provider. General instructions  Sleep on a firm mattress in a comfortable position. Try lying on your side with your knees slightly bent. If you lie on your back, put a pillow under your knees.  Do not drive or use heavy machinery while taking prescription pain medicine.  If your health care provider prescribed a diet or exercise program, follow it as directed.  Keep all follow-up visits as told by your health care provider. This is important. Contact a health care provider if:  Your pain does not improve over time, even when taking pain medicines. Get help right away if:  You develop severe pain.  Your pain suddenly gets worse.  You develop increasing weakness in your legs.  You lose the ability to control your bladder or bowel.  You have difficulty walking or balancing.  You have a fever. Summary  Lumbosacral radiculopathy is a condition that occurs when the spinal nerves and nerve roots in the lower part of the spine move out of place or become inflamed and cause symptoms.  Symptoms include pain, numbness, and tingling that go down from your back into your legs (sciatica), muscle weakness, and loss of bladder control or bowel control.  If directed, apply ice or heat to the affected area as told by your health care provider.  Follow instructions about activity, rest, and proper lifting technique. This  information is not intended to replace advice given to you by your health care provider. Make sure you discuss any questions you have with your health care provider. Document Revised: 11/25/2017 Document Reviewed: 11/25/2017 Elsevier Patient Education  2020 [employer]  

## 2020-06-21 LAB — CMP14+EGFR
ALT: 12 IU/L (ref 0–32)
AST: 16 IU/L (ref 0–40)
Albumin/Globulin Ratio: 1.5 (ref 1.2–2.2)
Albumin: 4.4 g/dL (ref 3.8–4.8)
Alkaline Phosphatase: 91 IU/L (ref 48–121)
BUN/Creatinine Ratio: 17 (ref 12–28)
BUN: 15 mg/dL (ref 8–27)
Bilirubin Total: 0.3 mg/dL (ref 0.0–1.2)
CO2: 23 mmol/L (ref 20–29)
Calcium: 9.2 mg/dL (ref 8.7–10.3)
Chloride: 105 mmol/L (ref 96–106)
Creatinine, Ser: 0.86 mg/dL (ref 0.57–1.00)
GFR calc Af Amer: 84 mL/min/{1.73_m2} (ref >59)
GFR calc non Af Amer: 73 mL/min/{1.73_m2} (ref >59)
Globulin, Total: 2.9 g/dL (ref 1.5–4.5)
Glucose: 70 mg/dL (ref 65–99)
Potassium: 4.5 mmol/L (ref 3.5–5.2)
Sodium: 142 mmol/L (ref 134–144)
Total Protein: 7.3 g/dL (ref 6.0–8.5)

## 2020-06-21 LAB — HEMOGLOBIN A1C
Est. average glucose Bld gHb Est-mCnc: 126 mg/dL
Hgb A1c MFr Bld: 6 % — ABNORMAL HIGH (ref 4.8–5.6)

## 2020-06-23 NOTE — Progress Notes (Signed)
This visit occurred during the SARS-CoV-2 public health emergency.  Safety protocols were in place, including screening questions prior to the visit, additional usage of staff PPE, and extensive cleaning of exam room while observing appropriate contact time as indicated for disinfecting solutions.  Subjective:     Patient ID: Stephanie French , female    DOB: December 16, 1956 , 64 y.o.   MRN: [id number]   Chief Complaint  Patient presents with  . Diabetes    HPI  She presents today for diabetes check. Reports compliance with meds. Admits she is not exercising as much, this is due to back pain. Described as dull, throbbing pain that radiates down her right side. Denies LE weakness/paresthesias. Denies fall/trauma. Denies urinary/fecal incontinence. She states this has bothered her on and off for several weeks. Unable to determine what initially triggered her sx.  Diabetes She presents for her follow-up diabetic visit. She has type 2 diabetes mellitus. There are no hypoglycemic associated symptoms. Pertinent negatives for diabetes include no blurred vision and no chest pain. There are no hypoglycemic complications. Symptoms are stable. Risk factors for coronary artery disease include diabetes mellitus, obesity and post-menopausal. Her breakfast blood glucose is taken between 8-9 am. Her breakfast blood glucose range is generally 90-110 mg/dl. An ACE inhibitor/angiotensin II receptor blocker is not being taken.     Past Medical History:  Diagnosis Date  . Atypical chest pain 08/31/2019  . Diabetes mellitus   . Hyperlipidemia   . Obesity   . Pure hypercholesterolemia 12/07/2019  . Shortness of breath 08/31/2019     Family History  Problem Relation Age of Onset  . Diabetes Mother   . Hypertension Mother   . Stroke Mother   . Heart attack Mother   . Cancer Other   . Hypertension Other   . Hyperlipidemia Other   . Stroke Other   . Heart attack Other   . Breast cancer Maternal Grandmother   .  Heart attack Maternal Grandmother   . Early death Father   . Lung cancer Father   . Diabetes Sister   . Hypertension Sister      Current Outpatient Medications:  .  ibuprofen (ADVIL,MOTRIN) 600 MG tablet, TK 1 T PO  Q 6 H PRN P, Disp: , Rfl:  .  ONETOUCH VERIO test strip, USE TWICE DAILY EVERY DAY TO CHECK BLOOD SUGAR BEFORE BREAKFAST AND DINNER, Disp: 100 each, Rfl: 3 .  pravastatin (PRAVACHOL) 40 MG tablet, One tab po MWF, Disp: 45 tablet, Rfl: 1 .  Semaglutide, 1 MG/DOSE, (OZEMPIC, 1 MG/DOSE,) 2 MG/1.5ML SOPN, Inject 1 mg into the skin once a week., Disp: 3 pen, Rfl: 3 .  Vitamin D, Ergocalciferol, (DRISDOL) 1.25 MG (50000 UNIT) CAPS capsule, Take 1 capsule by mouth on Tuesday and Friday, Disp: 24 capsule, Rfl: 1 .  cyclobenzaprine (FLEXERIL) 10 MG tablet, Take 1 tablet (10 mg total) by mouth at bedtime. As needed, Disp: 30 tablet, Rfl: 0 .  gabapentin (NEURONTIN) 100 MG capsule, Take 1 capsule (100 mg total) by mouth at bedtime. (Patient not taking: Reported on 06/20/2020), Disp: 90 capsule, Rfl: 0   Allergies  Allergen Reactions  . Eggs Or Egg-Derived Products     Allergic to the protein in the egg white  . Latex   . Penicillins      Review of Systems  Constitutional: Negative.   Eyes: Negative for blurred vision.  Respiratory: Negative.   Cardiovascular: Negative.  Negative for chest pain.  Gastrointestinal: Negative.  Musculoskeletal: Positive for back pain.  Neurological: Negative.   Psychiatric/Behavioral: Negative.      Today's Vitals   06/20/20 1403  BP: 118/74  Pulse: 78  Temp: 98.7 F (37.1 C)  TempSrc: Oral  Weight: 232 lb 9.6 oz (105.5 kg)  Height: _0  (1.626 m)   Body mass index is 39.93 kg/m.   Objective:  Physical Exam Vitals and nursing note reviewed.  Constitutional:      Appearance: Normal appearance. She is obese.  HENT:     Head: Normocephalic and atraumatic.  Cardiovascular:     Rate and Rhythm: Normal rate and regular rhythm.      Heart sounds: Normal heart sounds.  Pulmonary:     Effort: Pulmonary effort is normal.     Breath sounds: Normal breath sounds.  Musculoskeletal:     Comments: Neg straight leg test b/l. Gluteal tenderness b/l to deep palpation.   Skin:    General: Skin is warm.  Neurological:     General: No focal deficit present.     Mental Status: She is alert.  Psychiatric:        Mood and Affect: Mood normal.        Behavior: Behavior normal.         Assessment And Plan:     1. Uncontrolled type 2 diabetes mellitus with hyperglycemia (HCC)  Chronic, I will check labs as listed below. I will adjust meds as needed. She is encouraged to avoid sugary beverages.   - CMP14+EGFR - Hemoglobin A1c  2. Lumbar radiculopathy  She was given rx cyclobenzaprine, 84m to use nightly as needed. She is encouraged to update me in 24-48 hours to let me know how she is doing.   - MR Lumbar Spine Wo Contrast; Future  3. Encounter for screening mammogram for malignant neoplasm of breast  She agrees to schedule yearly mammogram. Encouraged to perform monthly self breast exams.   - MM Digital Screening; Future        RMaximino Greenland MD    THE PATIENT IS ENCOURAGED TO PRACTICE SOCIAL DISTANCING DUE TO THE COVID-19 PANDEMIC.

## 2020-07-01 ENCOUNTER — Other Ambulatory Visit: Payer: Self-pay | Admitting: Internal Medicine

## 2020-07-01 DIAGNOSIS — Z1231 Encounter for screening mammogram for malignant neoplasm of breast: Secondary | ICD-10-CM

## 2020-07-07 ENCOUNTER — Ambulatory Visit
Admission: RE | Admit: 2020-07-07 | Discharge: 2020-07-07 | Disposition: A | Discharge status: Home / Self Care | Payer: BC Managed Care – PPO | Source: Ambulatory Visit | Attending: Internal Medicine | Admitting: Internal Medicine

## 2020-07-07 ENCOUNTER — Other Ambulatory Visit: Payer: Self-pay

## 2020-07-07 DIAGNOSIS — M5416 Radiculopathy, lumbar region: Secondary | ICD-10-CM

## 2020-07-08 ENCOUNTER — Ambulatory Visit
Admission: RE | Admit: 2020-07-08 | Discharge: 2020-07-08 | Disposition: A | Discharge status: Home / Self Care | Payer: BC Managed Care – PPO | Source: Ambulatory Visit | Attending: Internal Medicine | Admitting: Internal Medicine

## 2020-07-08 DIAGNOSIS — Z1231 Encounter for screening mammogram for malignant neoplasm of breast: Secondary | ICD-10-CM

## 2020-07-11 ENCOUNTER — Telehealth: Payer: Self-pay

## 2020-07-11 NOTE — Telephone Encounter (Signed)
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   How do you wish to proceed?

## 2020-07-29 ENCOUNTER — Other Ambulatory Visit: Payer: Self-pay

## 2020-07-29 DIAGNOSIS — M4800 Spinal stenosis, site unspecified: Secondary | ICD-10-CM

## 2020-07-31 ENCOUNTER — Other Ambulatory Visit: Payer: Self-pay | Admitting: Internal Medicine

## 2020-08-23 ENCOUNTER — Other Ambulatory Visit: Payer: Self-pay | Admitting: Internal Medicine

## 2020-09-23 DIAGNOSIS — M4316 Spondylolisthesis, lumbar region: Secondary | ICD-10-CM | POA: Insufficient documentation

## 2020-10-07 ENCOUNTER — Ambulatory Visit (INDEPENDENT_AMBULATORY_CARE_PROVIDER_SITE_OTHER): Payer: BC Managed Care – PPO | Admitting: Internal Medicine

## 2020-10-07 ENCOUNTER — Other Ambulatory Visit: Payer: Self-pay

## 2020-10-07 ENCOUNTER — Encounter: Payer: Self-pay | Admitting: Internal Medicine

## 2020-10-07 ENCOUNTER — Other Ambulatory Visit (HOSPITAL_COMMUNITY)
Admission: RE | Admit: 2020-10-07 | Discharge: 2020-10-07 | Disposition: A | Discharge status: Home / Self Care | Payer: BC Managed Care – PPO | Source: Ambulatory Visit | Attending: Internal Medicine | Admitting: Internal Medicine

## 2020-10-07 VITALS — BP 128/68 | HR 64 | Temp 97.7°F | Ht 64.8 in | Wt 236.2 lb

## 2020-10-07 DIAGNOSIS — Z0000 Encounter for general adult medical examination without abnormal findings: Secondary | ICD-10-CM

## 2020-10-07 DIAGNOSIS — E1165 Type 2 diabetes mellitus with hyperglycemia: Secondary | ICD-10-CM

## 2020-10-07 DIAGNOSIS — Z01419 Encounter for gynecological examination (general) (routine) without abnormal findings: Secondary | ICD-10-CM

## 2020-10-07 DIAGNOSIS — E559 Vitamin D deficiency, unspecified: Secondary | ICD-10-CM

## 2020-10-07 DIAGNOSIS — Z6839 Body mass index (BMI) 39.0-39.9, adult: Secondary | ICD-10-CM

## 2020-10-07 LAB — POCT UA - MICROALBUMIN
Creatinine, POC: 50 mg/dL
Microalbumin Ur, POC: 10 mg/L

## 2020-10-07 LAB — POCT URINALYSIS DIPSTICK
Bilirubin, UA: NEGATIVE
Blood, UA: NEGATIVE
Glucose, UA: NEGATIVE
Ketones, UA: NEGATIVE
Leukocytes, UA: NEGATIVE
Nitrite, UA: NEGATIVE
Protein, UA: NEGATIVE
Spec Grav, UA: 1.015 (ref 1.010–1.025)
Urobilinogen, UA: 0.2 E.U./dL
pH, UA: 7 (ref 5.0–8.0)

## 2020-10-07 LAB — POC HEMOCCULT BLD/STL (OFFICE/1-CARD/DIAGNOSTIC)
Card #1 Date: 10182021
Fecal Occult Blood, POC: NEGATIVE

## 2020-10-07 NOTE — Patient Instructions (Signed)
Health Maintenance, Female Adopting a healthy lifestyle and getting preventive care are important in promoting health and wellness. Ask your health care provider about:  The right schedule for you to have regular tests and exams.  Things you can do on your own to prevent diseases and keep yourself healthy. What should I know about diet, weight, and exercise? Eat a healthy diet   Eat a diet that includes plenty of vegetables, fruits, low-fat dairy products, and lean protein.  Do not eat a lot of foods that are high in solid fats, added sugars, or sodium. Maintain a healthy weight Body mass index (BMI) is used to identify weight problems. It estimates body fat based on height and weight. Your health care provider can help determine your BMI and help you achieve or maintain a healthy weight. Get regular exercise Get regular exercise. This is one of the most important things you can do for your health. Most adults should:  Exercise for at least 150 minutes each week. The exercise should increase your heart rate and make you sweat (moderate-intensity exercise).  Do strengthening exercises at least twice a week. This is in addition to the moderate-intensity exercise.  Spend less time sitting. Even light physical activity can be beneficial. Watch cholesterol and blood lipids Have your blood tested for lipids and cholesterol at 63 years of age, then have this test every 5 years. Have your cholesterol levels checked more often if:  Your lipid or cholesterol levels are high.  You are older than 63 years of age.  You are at high risk for heart disease. What should I know about cancer screening? Depending on your health history and family history, you may need to have cancer screening at various ages. This may include screening for:  Breast cancer.  Cervical cancer.  Colorectal cancer.  Skin cancer.  Lung cancer. What should I know about heart disease, diabetes, and high blood  pressure? Blood pressure and heart disease  High blood pressure causes heart disease and increases the risk of stroke. This is more likely to develop in people who have high blood pressure readings, are of African descent, or are overweight.  Have your blood pressure checked: ? Every 3-5 years if you are 18-39 years of age. ? Every year if you are 40 years old or older. Diabetes Have regular diabetes screenings. This checks your fasting blood sugar level. Have the screening done:  Once every three years after age 40 if you are at a normal weight and have a low risk for diabetes.  More often and at a younger age if you are overweight or have a high risk for diabetes. What should I know about preventing infection? Hepatitis B If you have a higher risk for hepatitis B, you should be screened for this virus. Talk with your health care provider to find out if you are at risk for hepatitis B infection. Hepatitis C Testing is recommended for:  Everyone born from 1945 through 1965.  Anyone with known risk factors for hepatitis C. Sexually transmitted infections (STIs)  Get screened for STIs, including gonorrhea and chlamydia, if: ? You are sexually active and are younger than 63 years of age. ? You are older than 63 years of age and your health care provider tells you that you are at risk for this type of infection. ? Your sexual activity has changed since you were last screened, and you are at increased risk for chlamydia or gonorrhea. Ask your health care provider if   you are at risk.  Ask your health care provider about whether you are at high risk for HIV. Your health care provider may recommend a prescription medicine to help prevent HIV infection. If you choose to take medicine to prevent HIV, you should first get tested for HIV. You should then be tested every 3 months for as long as you are taking the medicine. Pregnancy  If you are about to stop having your period (premenopausal) and  you may become pregnant, seek counseling before you get pregnant.  Take 400 to 800 micrograms (mcg) of folic acid every day if you become pregnant.  Ask for birth control (contraception) if you want to prevent pregnancy. Osteoporosis and menopause Osteoporosis is a disease in which the bones lose minerals and strength with aging. This can result in bone fractures. If you are 65 years old or older, or if you are at risk for osteoporosis and fractures, ask your health care provider if you should:  Be screened for bone loss.  Take a calcium or vitamin D supplement to lower your risk of fractures.  Be given hormone replacement therapy (HRT) to treat symptoms of menopause. Follow these instructions at home: Lifestyle  Do not use any products that contain nicotine or tobacco, such as cigarettes, e-cigarettes, and chewing tobacco. If you need help quitting, ask your health care provider.  Do not use street drugs.  Do not share needles.  Ask your health care provider for help if you need support or information about quitting drugs. Alcohol use  Do not drink alcohol if: ? Your health care provider tells you not to drink. ? You are pregnant, may be pregnant, or are planning to become pregnant.  If you drink alcohol: ? Limit how much you use to 0-1 drink a day. ? Limit intake if you are breastfeeding.  Be aware of how much alcohol is in your drink. In the [place] one drink equals one 12 oz bottle of beer (355 mL), one 5 oz glass of wine (148 mL), or one 1 oz glass of hard liquor (44 mL). General instructions  Schedule regular health, dental, and eye exams.  Stay current with your vaccines.  Tell your health care provider if: ? You often feel depressed. ? You have ever been abused or do not feel safe at home. Summary  Adopting a healthy lifestyle and getting preventive care are important in promoting health and wellness.  Follow your health care provider's instructions about healthy  diet, exercising, and getting tested or screened for diseases.  Follow your health care provider's instructions on monitoring your cholesterol and blood pressure. This information is not intended to replace advice given to you by your health care provider. Make sure you discuss any questions you have with your health care provider. Document Revised: 11/30/2018 Document Reviewed: 11/30/2018 Elsevier Patient Education  2020 [employer].  

## 2020-10-07 NOTE — Progress Notes (Signed)
I,Tianna Badgett,acting as a Education administrator for Maximino Greenland, MD.,have documented all relevant documentation on the behalf of Maximino Greenland, MD,as directed by  Maximino Greenland, MD while in the presence of Maximino Greenland, MD.  This visit occurred during the SARS-CoV-2 public health emergency.  Safety protocols were in place, including screening questions prior to the visit, additional usage of staff PPE, and extensive cleaning of exam room while observing appropriate contact time as indicated for disinfecting solutions.  Subjective:     Patient ID: Stephanie French , female    DOB: October 16, 1956 , 64 y.o.   MRN: [id number]   Chief Complaint  Patient presents with  . Annual Exam  . Diabetes    HPI  She is here today for a full physical examination. She has no specific concerns at this time. She is not followed by GYN. She would like to have a pap smear performed today.   Diabetes She presents for her follow-up diabetic visit. She has type 2 diabetes mellitus. There are no hypoglycemic associated symptoms. Pertinent negatives for diabetes include no blurred vision and no chest pain. There are no hypoglycemic complications. Risk factors for coronary artery disease include diabetes mellitus, dyslipidemia, obesity, post-menopausal and sedentary lifestyle. She participates in exercise intermittently. Eye exam is not current.     Past Medical History:  Diagnosis Date  . Atypical chest pain 08/31/2019  . Diabetes mellitus   . Hyperlipidemia   . Obesity   . Pure hypercholesterolemia 12/07/2019  . Shortness of breath 08/31/2019     Family History  Problem Relation Age of Onset  . Diabetes Mother   . Hypertension Mother   . Stroke Mother   . Heart attack Mother   . Cancer Other   . Hypertension Other   . Hyperlipidemia Other   . Stroke Other   . Heart attack Other   . Breast cancer Maternal Grandmother   . Heart attack Maternal Grandmother   . Early death Father   . Lung cancer Father   .  Diabetes Sister   . Hypertension Sister      Current Outpatient Medications:  .  cyclobenzaprine (FLEXERIL) 10 MG tablet, Take 1 tablet (10 mg total) by mouth at bedtime. As needed, Disp: 30 tablet, Rfl: 0 .  gabapentin (NEURONTIN) 100 MG capsule, Take 1 capsule (100 mg total) by mouth at bedtime. (Patient not taking: Reported on 06/20/2020), Disp: 90 capsule, Rfl: 0 .  ibuprofen (ADVIL,MOTRIN) 600 MG tablet, TK 1 T PO  Q 6 H PRN P, Disp: , Rfl:  .  ONETOUCH VERIO test strip, USE TWICE DAILY EVERY DAY TO CHECK BLOOD SUGAR BEFORE BREAKFAST AND DINNER, Disp: 400 strip, Rfl: 3 .  OZEMPIC, 1 MG/DOSE, 4 MG/3ML SOPN, INJECT 1 MG INTO THE SKIN ONCE A WEEK, Disp: 9 mL, Rfl: 2 .  pravastatin (PRAVACHOL) 40 MG tablet, One tab po MWF, Disp: 45 tablet, Rfl: 1 .  Semaglutide, 1 MG/DOSE, (OZEMPIC, 1 MG/DOSE,) 2 MG/1.5ML SOPN, Inject 1 mg into the skin once a week., Disp: 3 pen, Rfl: 3 .  Vitamin D, Ergocalciferol, (DRISDOL) 1.25 MG (50000 UNIT) CAPS capsule, Take 1 capsule by mouth on Tuesday and Friday, Disp: 24 capsule, Rfl: 1   Allergies  Allergen Reactions  . Eggs Or Egg-Derived Products     Allergic to the protein in the egg white  . Latex   . Penicillins       The patient states she uses post menopausal status for  birth control. Last LMP was Patient's last menstrual period was 08/12/2012.. Negative for Dysmenorrhea. Negative for: breast discharge, breast lump(s), breast pain and breast self exam. Associated symptoms include abnormal vaginal bleeding. Pertinent negatives include abnormal bleeding (hematology), anxiety, decreased libido, depression, difficulty falling sleep, dyspareunia, history of infertility, nocturia, sexual dysfunction, sleep disturbances, urinary incontinence, urinary urgency, vaginal discharge and vaginal itching. Diet regular.The patient states her exercise level is    . The patient's tobacco use is:  Social History   Tobacco Use  Smoking Status Former Smoker  . Packs/day:  0.10  . Years: 40.00  . Pack years: 4.00  . Types: Cigarettes  Smokeless Tobacco Never Used  . She has been exposed to passive smoke. The patient's alcohol use is:  Social History   Substance and Sexual Activity  Alcohol Use No   Review of Systems  Constitutional: Negative.   HENT: Negative.   Eyes: Negative.  Negative for blurred vision.  Respiratory: Negative.   Cardiovascular: Negative.  Negative for chest pain.  Gastrointestinal: Negative.   Endocrine: Negative.   Genitourinary: Negative.   Musculoskeletal: Negative.   Skin: Negative.   Allergic/Immunologic: Negative.   Neurological: Negative.   Hematological: Negative.   Psychiatric/Behavioral: Negative.      Today's Vitals   10/07/20 0854  BP: 128/68  Pulse: 64  Temp: 97.7 F (36.5 C)  TempSrc: Oral  Weight: 236 lb 3.2 oz (107.1 kg)  Height: 5' 4.8" (1.646 m)   Body mass index is 39.55 kg/m.   Objective:  Physical Exam Vitals and nursing note reviewed. Exam conducted with a chaperone present.  Constitutional:      Appearance: Normal appearance. She is obese.  HENT:     Head: Normocephalic and atraumatic.     Right Ear: Tympanic membrane, ear canal and external ear normal.     Left Ear: Tympanic membrane, ear canal and external ear normal.     Nose:     Comments: Deferred, masked    Mouth/Throat:     Comments: Deferred, masked Eyes:     Extraocular Movements: Extraocular movements intact.     Conjunctiva/sclera: Conjunctivae normal.     Pupils: Pupils are equal, round, and reactive to light.  Cardiovascular:     Rate and Rhythm: Normal rate and regular rhythm.     Pulses: Normal pulses.          Dorsalis pedis pulses are 2+ on the right side and 2+ on the left side.     Heart sounds: Normal heart sounds.  Pulmonary:     Effort: Pulmonary effort is normal.     Breath sounds: Normal breath sounds.  Chest:     Breasts: Tanner Score is 5.        Right: Normal.        Left: Normal.  Abdominal:      General: Bowel sounds are normal.     Palpations: Abdomen is soft.     Comments: Rounded, soft  Genitourinary:    General: Normal vulva.     Exam position: Lithotomy position.     Tanner stage (genital): 5.     Vagina: Normal.     Cervix: Normal.     Uterus: Normal.      Rectum: Normal. Guaiac result negative. No tenderness.  Musculoskeletal:        General: Normal range of motion.     Cervical back: Normal range of motion and neck supple.  Feet:     Right foot:  Protective Sensation: 5 sites tested. 5 sites sensed.     Skin integrity: Skin integrity normal.     Toenail Condition: Right toenails are normal.     Left foot:     Protective Sensation: 5 sites tested. 5 sites sensed.     Skin integrity: Skin integrity normal.     Toenail Condition: Left toenails are normal.  Skin:    General: Skin is warm and dry.  Neurological:     General: No focal deficit present.     Mental Status: She is alert and oriented to person, place, and time.  Psychiatric:        Mood and Affect: Mood normal.        Behavior: Behavior normal.         Assessment And Plan:     1. Encounter for annual physical exam Comments: A full exam was performed. Importance of monthly self breast exams was discussed with the patient. PATIENT IS ADVISED TO GET 30-45 MINUTES REGULAR EXERCISE NO LESS THAN FOUR TO FIVE DAYS PER WEEK - BOTH WEIGHTBEARING EXERCISES AND AEROBIC ARE RECOMMENDED.  PATIENT IS ADVISED TO FOLLOW A HEALTHY DIET WITH AT LEAST SIX FRUITS/VEGGIES PER DAY, DECREASE INTAKE OF RED MEAT, AND TO INCREASE FISH INTAKE TO TWO DAYS PER WEEK.  MEATS/FISH SHOULD NOT BE FRIED, BAKED OR BROILED IS PREFERABLE.  I SUGGEST WEARING SPF 50 SUNSCREEN ON EXPOSED PARTS AND ESPECIALLY WHEN IN THE DIRECT SUNLIGHT FOR AN EXTENDED PERIOD OF TIME.  PLEASE AVOID FAST FOOD RESTAURANTS AND INCREASE YOUR WATER INTAKE.  - CBC - Hemoglobin A1c - Lipid panel - BMP8+EGFR  2. Cervical smear, as part of routine gynecological  examination Comments: Pap smear performed. Stool is heme negative.  - Cytology -Pap Smear - POC Hemoccult Bld/Stl (1-Cd Office Dx)  3. Uncontrolled type 2 diabetes mellitus with hyperglycemia ([place]) Comments: Diabetic foot exam was performed. I DISCUSSED WITH THE PATIENT AT LENGTH REGARDING THE GOALS OF GLYCEMIC CONTROL AND POSSIBLE LONG-TERM COMPLICATIONS.  I  ALSO STRESSED THE IMPORTANCE OF COMPLIANCE WITH HOME GLUCOSE MONITORING, DIETARY RESTRICTIONS INCLUDING AVOIDANCE OF SUGARY DRINKS/PROCESSED FOODS,  ALONG WITH REGULAR EXERCISE.  I  ALSO STRESSED THE IMPORTANCE OF ANNUAL EYE EXAMS, SELF FOOT CARE AND COMPLIANCE WITH OFFICE VISITS.  - POCT Urinalysis Dipstick (81002) - POCT UA - Microalbumin - EKG 12-Lead  4. Vitamin D deficiency Comments: I will check a vitamin D level and supplement as needed.  - VITAMIN D 25 Hydroxy (Vit-D Deficiency, Fractures)  5. Class 2 severe obesity due to excess calories with serious comorbidity and body mass index (BMI) of 39.0 to 39.9 in adult [facility]) Comments: She is encouraged to strive for BMI less than 32 to decrease cardiac risk. Advised to aim for at least 150 minutes of exercise per week. Encouraged to strive to lose 10-15 pounds prior to her next visit.      Patient was given opportunity to ask questions. Patient verbalized understanding of the plan and was able to repeat key elements of the plan. All questions were answered to their satisfaction.   Maximino Greenland, MD   I, Maximino Greenland, MD, have reviewed all documentation for this visit. The documentation on 10/08/20 for the exam, diagnosis, procedures, and orders are all accurate and complete.  THE PATIENT IS ENCOURAGED TO PRACTICE SOCIAL DISTANCING DUE TO THE COVID-19 PANDEMIC.

## 2020-10-08 ENCOUNTER — Encounter: Payer: Self-pay | Admitting: Internal Medicine

## 2020-10-08 LAB — LIPID PANEL
Chol/HDL Ratio: 2 ratio (ref 0.0–4.4)
Cholesterol, Total: 190 mg/dL (ref 100–199)
HDL: 93 mg/dL (ref >39)
LDL Chol Calc (NIH): 85 mg/dL (ref 0–99)
Triglycerides: 63 mg/dL (ref 0–149)
VLDL Cholesterol Cal: 12 mg/dL (ref 5–40)

## 2020-10-08 LAB — BMP8+EGFR
BUN/Creatinine Ratio: 14 (ref 12–28)
BUN: 12 mg/dL (ref 8–27)
CO2: 24 mmol/L (ref 20–29)
Calcium: 9.8 mg/dL (ref 8.7–10.3)
Chloride: 103 mmol/L (ref 96–106)
Creatinine, Ser: 0.83 mg/dL (ref 0.57–1.00)
GFR calc Af Amer: 87 mL/min/{1.73_m2} (ref >59)
GFR calc non Af Amer: 76 mL/min/{1.73_m2} (ref >59)
Glucose: 81 mg/dL (ref 65–99)
Potassium: 4.7 mmol/L (ref 3.5–5.2)
Sodium: 138 mmol/L (ref 134–144)

## 2020-10-08 LAB — CBC
Hematocrit: 39.2 % (ref 34.0–46.6)
Hemoglobin: 12.1 g/dL (ref 11.1–15.9)
MCH: 22.2 pg — ABNORMAL LOW (ref 26.6–33.0)
MCHC: 30.9 g/dL — ABNORMAL LOW (ref 31.5–35.7)
MCV: 72 fL — ABNORMAL LOW (ref 79–97)
Platelets: 262 10*3/uL (ref 150–450)
RBC: 5.45 x10E6/uL — ABNORMAL HIGH (ref 3.77–5.28)
RDW: 16.3 % — ABNORMAL HIGH (ref 11.7–15.4)
WBC: 6.9 10*3/uL (ref 3.4–10.8)

## 2020-10-08 LAB — HEMOGLOBIN A1C
Est. average glucose Bld gHb Est-mCnc: 134 mg/dL
Hgb A1c MFr Bld: 6.3 % — ABNORMAL HIGH (ref 4.8–5.6)

## 2020-10-08 LAB — VITAMIN D 25 HYDROXY (VIT D DEFICIENCY, FRACTURES): Vit D, 25-Hydroxy: 21.9 ng/mL — ABNORMAL LOW (ref 30.0–100.0)

## 2020-10-10 LAB — CYTOLOGY - PAP
Comment: NEGATIVE
Diagnosis: NEGATIVE
Diagnosis: REACTIVE
High risk HPV: POSITIVE — AB

## 2020-10-11 ENCOUNTER — Telehealth: Payer: Self-pay | Admitting: Cardiovascular Disease

## 2020-10-11 NOTE — Telephone Encounter (Signed)
Left message for patient to call and discuss schedule the Chest CT ordered by Dr. Oval Linsey

## 2020-10-15 ENCOUNTER — Other Ambulatory Visit: Payer: Self-pay

## 2020-10-15 ENCOUNTER — Encounter: Payer: Self-pay | Admitting: Internal Medicine

## 2020-10-15 ENCOUNTER — Other Ambulatory Visit: Payer: Self-pay | Admitting: Internal Medicine

## 2020-10-15 DIAGNOSIS — B977 Papillomavirus as the cause of diseases classified elsewhere: Secondary | ICD-10-CM

## 2020-10-15 MED ORDER — VITAMIN D (ERGOCALCIFEROL) 1.25 MG (50000 UNIT) PO CAPS: ORAL_CAPSULE | ORAL | 1 refills | Status: DC

## 2020-10-17 NOTE — Telephone Encounter (Signed)
Left message for patient to call and discuss scheduling CT chest ordered by Dr. Oval Linsey

## 2020-10-18 NOTE — Telephone Encounter (Signed)
Spoke with patient regarding appointment for CT chest w/o contrast ordered by Dr. Donivan Scull for Tuesday 10/22/20 at 4:15pm at [facility] ([address]).  Arrival time is 4:00pm .  Information is in My Chart and patient voiced her understanding.

## 2020-10-22 ENCOUNTER — Inpatient Hospital Stay: Admission: RE | Admit: 2020-10-22 | Payer: BC Managed Care – PPO | Source: Ambulatory Visit

## 2020-10-31 ENCOUNTER — Ambulatory Visit (INDEPENDENT_AMBULATORY_CARE_PROVIDER_SITE_OTHER)
Admission: RE | Admit: 2020-10-31 | Discharge: 2020-10-31 | Disposition: A | Discharge status: Home / Self Care | Payer: BC Managed Care – PPO | Source: Ambulatory Visit | Attending: Cardiovascular Disease | Admitting: Cardiovascular Disease

## 2020-10-31 ENCOUNTER — Other Ambulatory Visit: Payer: Self-pay

## 2020-10-31 DIAGNOSIS — R918 Other nonspecific abnormal finding of lung field: Secondary | ICD-10-CM | POA: Diagnosis not present

## 2020-10-31 DIAGNOSIS — R911 Solitary pulmonary nodule: Secondary | ICD-10-CM

## 2020-12-02 LAB — HM DIABETES EYE EXAM

## 2020-12-31 ENCOUNTER — Encounter: Payer: Self-pay | Admitting: Cardiovascular Disease

## 2020-12-31 ENCOUNTER — Other Ambulatory Visit: Payer: Self-pay

## 2020-12-31 ENCOUNTER — Ambulatory Visit: Payer: BC Managed Care – PPO | Admitting: Cardiovascular Disease

## 2020-12-31 VITALS — BP 118/72 | HR 85 | Ht 64.0 in | Wt 229.0 lb

## 2020-12-31 DIAGNOSIS — E7800 Pure hypercholesterolemia, unspecified: Secondary | ICD-10-CM | POA: Diagnosis not present

## 2020-12-31 DIAGNOSIS — R0602 Shortness of breath: Secondary | ICD-10-CM

## 2020-12-31 DIAGNOSIS — Z6841 Body Mass Index (BMI) 40.0 and over, adult: Secondary | ICD-10-CM | POA: Diagnosis not present

## 2020-12-31 NOTE — Progress Notes (Signed)
Cardiology Office Note   Date:  12/31/2020   ID:  Stephanie French, DOB [birth year], MRN [id number]  PCP:  Glendale Chard, MD  Cardiologist:   Skeet Latch, MD   No chief complaint on file.   History of Present Illness: Stephanie French is a 65 y.o. female with diabetes, hyperlipidemia, mild sleep apnea, and obesity here for follow up.  She was initially seen 08/2019 for an evaluation of shortness of breath.  Ms. Vanostrand noticed that when she walked a long distance she got short of breath.  At one point she thought she was having a heart attack because it was associated with chest tightness. She was referred for a coronary CT-A 09/2019 that had a calcium score of 0 and no CAD detected.  She had an echo 09/18/19 that revealed LVEF 65-70% and was otherwise normal.  Prior to that appointment she had LE Dopplers that were negative for DVT.  Since her last appointment Ms. Folino has been well.  She denies any recurrent chest pain.  She recently started walking and lifting weights.  She has been able to lose some weight with these efforts.  She is also watching her diet.  She has no exertional chest pain and her breathing is good.  She denies lower extremity edema, orthopnea, or PND.  There was a pulmonary nodule on her coronary CT in 2010.  She had a follow-up noncontrast chest CT 10/2020 that was stable.  No further imaging was required.   Past Medical History:  Diagnosis Date  . Atypical chest pain 08/31/2019  . Diabetes mellitus   . Hyperlipidemia   . Obesity   . Pure hypercholesterolemia 12/07/2019  . Shortness of breath 08/31/2019    Past Surgical History:  Procedure Laterality Date  . OOPHORECTOMY     left ovary removed     Current Outpatient Medications  Medication Sig Dispense Refill  . ibuprofen (ADVIL,MOTRIN) 600 MG tablet TK 1 T PO  Q 6 H PRN P    . ONETOUCH VERIO test strip USE TWICE DAILY EVERY DAY TO CHECK BLOOD SUGAR BEFORE BREAKFAST AND DINNER 400 strip 3  . OZEMPIC, 1  MG/DOSE, 4 MG/3ML SOPN INJECT 1 MG INTO THE SKIN ONCE A WEEK 9 mL 2  . pravastatin (PRAVACHOL) 40 MG tablet     . Vitamin D, Ergocalciferol, (DRISDOL) 1.25 MG (50000 UNIT) CAPS capsule Take 1 capsule by mouth on Tuesday and Friday 24 capsule 1   No current facility-administered medications for this visit.    Allergies:   Eggs or egg-derived products, Latex, Other, Penicillins, Shellfish allergy, and Shiitake mushroom    Social History:  The patient  reports that she has quit smoking. Her smoking use included cigarettes. She has a 4.00 pack-year smoking history. She has never used smokeless tobacco. She reports that she does not drink alcohol and does not use drugs.   Family History:  The patient's family history includes Breast cancer in her maternal grandmother; Cancer in an other family member; Diabetes in her mother and sister; Early death in her father; Heart attack in her maternal grandmother, mother, and another family member; Hyperlipidemia in an other family member; Hypertension in her mother, sister, and another family member; Lung cancer in her father; Stroke in her mother and another family member.    ROS:  Please see the history of present illness.   Otherwise, review of systems are positive for none.   All other systems are reviewed and negative.  PHYSICAL EXAM: VS:  BP 118/72   Pulse 85   Ht 5\' 4"  (1.626 m)   Wt 229 lb (103.9 kg)   LMP 08/12/2012   SpO2 98%   BMI 39.31 kg/m  , BMI Body mass index is 39.31 kg/m. GENERAL:  Well appearing HEENT: Pupils equal round and reactive, fundi not visualized, oral mucosa unremarkable NECK:  No jugular venous distention, waveform within normal limits, carotid upstroke brisk and symmetric, no bruits LUNGS:  Clear to auscultation bilaterally HEART:  RRR.  PMI not displaced or sustained,S1 and S2 within normal limits, no S3, no S4, no clicks, no rubs, no murmurs ABD:  Flat, positive bowel sounds normal in frequency in pitch, no  bruits, no rebound, no guarding, no midline pulsatile mass, no hepatomegaly, no splenomegaly EXT:  2 plus pulses throughout, no edema, no cyanosis no clubbing SKIN:  No rashes no nodules NEURO:  Cranial nerves II through XII grossly intact, motor grossly intact throughout PSYCH:  Cognitively intact, oriented to person place and time  EKG:  EKG is ordered today. The ekg ordered 08/31/19 demonstrates sinus rhythm.  Rate 63 bpm.   12/31/20: Sinus rhythm.  Rate 85 bpm  Coronary CT-A 10/05/19: IMPRESSION: 1. Coronary calcium score of 0. This was 0 percentile for age and sex matched control.  2. Normal coronary origin with right dominance.  3. No evidence of CAD.   Echo 09/18/19: IMPRESSIONS   1. Left ventricular ejection fraction, by visual estimation, is 65 to 70%. The left ventricle has normal function. Normal left ventricular size. There is no left ventricular hypertrophy.  2. Global right ventricle has normal systolic function.The right ventricular size is normal. No increase in right ventricular wall thickness.  3. Left atrial size was normal.  4. Right atrial size was normal.  5. The mitral valve is normal in structure. Trace mitral valve regurgitation.  6. The tricuspid valve is normal in structure. Tricuspid valve regurgitation is trivial.  7. The aortic valve is normal in structure. Aortic valve regurgitation was not visualized by color flow Doppler.  8. The pulmonic valve was grossly normal. Pulmonic valve regurgitation is trivial by color flow Doppler.  9. Normal pulmonary artery systolic pressure. 10. The inferior vena cava is normal in size with greater than 50% respiratory variability, suggesting right atrial pressure of 3 mmHg.  Recent Labs: 06/20/2020: ALT 12 10/07/2020: BUN 12; Creatinine, Ser 0.83; Hemoglobin 12.1; Platelets 262; Potassium 4.7; Sodium 138    Lipid Panel    Component Value Date/Time   CHOL 190 10/07/2020 0953   TRIG 63 10/07/2020 0953   HDL 93  10/07/2020 0953   CHOLHDL 2.0 10/07/2020 0953   LDLCALC 85 10/07/2020 0953      Wt Readings from Last 3 Encounters:  12/31/20 229 lb (103.9 kg)  10/07/20 236 lb 3.2 oz (107.1 kg)  06/20/20 232 lb 9.6 oz (105.5 kg)      ASSESSMENT AND PLAN:  # Atypical chest pain: # Shortness of breath: Symptoms have resolved.  Echo and coronary CT-A were unremarkable.  Her chest pain does not seem cardiac.  She was encouraged to increase her physical activity and work on weight loss as directed by Dr. Baird Cancer.  # Hyperlipidemia:  Continue pravastatin.  Lipids well having controlled 09/2020  # Pulmonary nodule:  Stable.  No follow-up required.  Current medicines are reviewed at length with the patient today.  The patient does not have concerns regarding medicines.  The following changes have been made:  no  change  Labs/ tests ordered today include:  No orders of the defined types were placed in this encounter.    Disposition:   FU with Ziah Turvey C. Oval Linsey, MD, [facility] as needed    Signed, [place] Oval Linsey, MD, [facility]  12/31/2020 6:29 PM    [place]

## 2020-12-31 NOTE — Patient Instructions (Signed)
Medication Instructions:  ?Your physician recommends that you continue on your current medications as directed. Please refer to the Current Medication list given to you today.  ? ?Labwork: ?NONE ? ?Testing/Procedures: ?NONE ? ?Follow-Up: ?AS NEEDED  ? ?  ?

## 2021-01-13 NOTE — Addendum Note (Signed)
Addended by: Merri Ray A on: 01/13/2021 02:20 PM   Modules accepted: Orders

## 2021-02-10 ENCOUNTER — Encounter: Payer: Self-pay | Admitting: Internal Medicine

## 2021-02-10 ENCOUNTER — Other Ambulatory Visit: Payer: Self-pay

## 2021-02-10 ENCOUNTER — Ambulatory Visit (INDEPENDENT_AMBULATORY_CARE_PROVIDER_SITE_OTHER): Payer: BC Managed Care – PPO | Admitting: Internal Medicine

## 2021-02-10 VITALS — BP 118/70 | HR 68 | Temp 98.3°F | Ht 64.7 in | Wt 228.0 lb

## 2021-02-10 DIAGNOSIS — Z6838 Body mass index (BMI) 38.0-38.9, adult: Secondary | ICD-10-CM

## 2021-02-10 DIAGNOSIS — E1165 Type 2 diabetes mellitus with hyperglycemia: Secondary | ICD-10-CM

## 2021-02-10 DIAGNOSIS — E7800 Pure hypercholesterolemia, unspecified: Secondary | ICD-10-CM

## 2021-02-10 MED ORDER — OZEMPIC (1 MG/DOSE) 4 MG/3ML ~~LOC~~ SOPN
1.0000 mg | PEN_INJECTOR | SUBCUTANEOUS | 2 refills | Status: DC
Start: 2021-02-10 — End: 2021-03-04

## 2021-02-10 NOTE — Progress Notes (Signed)
I,Katawbba Wiggins,acting as a Education administrator for Maximino Greenland, MD.,have documented all relevant documentation on the behalf of Maximino Greenland, MD,as directed by  Maximino Greenland, MD while in the presence of Maximino Greenland, MD.  This visit occurred during the SARS-CoV-2 public health emergency.  Safety protocols were in place, including screening questions prior to the visit, additional usage of staff PPE, and extensive cleaning of exam room while observing appropriate contact time as indicated for disinfecting solutions.  Subjective:     Patient ID: Stephanie French , female    DOB: May 10, 1956 , 65 y.o.   MRN: [id number]   Chief Complaint  Patient presents with  . Diabetes    HPI  She presents today for diabetes and cholesterol check.   The patient said she hasn't had her Pravastatin in about 6 mths because the pharmacy didn't get refills from the office.  Diabetes She presents for her follow-up diabetic visit. She has type 2 diabetes mellitus. There are no hypoglycemic associated symptoms. Pertinent negatives for diabetes include no blurred vision and no chest pain. There are no hypoglycemic complications. Symptoms are stable. Risk factors for coronary artery disease include diabetes mellitus, obesity and post-menopausal. Her breakfast blood glucose is taken between 8-9 am. Her breakfast blood glucose range is generally 90-110 mg/dl. An ACE inhibitor/angiotensin II receptor blocker is not being taken.     Past Medical History:  Diagnosis Date  . Atypical chest pain 08/31/2019  . Diabetes mellitus   . Hyperlipidemia   . Obesity   . Pure hypercholesterolemia 12/07/2019  . Shortness of breath 08/31/2019     Family History  Problem Relation Age of Onset  . Diabetes Mother   . Hypertension Mother   . Stroke Mother   . Heart attack Mother   . Cancer Other   . Hypertension Other   . Hyperlipidemia Other   . Stroke Other   . Heart attack Other   . Breast cancer Maternal Grandmother   .  Heart attack Maternal Grandmother   . Early death Father   . Lung cancer Father   . Diabetes Sister   . Hypertension Sister      Current Outpatient Medications:  .  ibuprofen (ADVIL,MOTRIN) 600 MG tablet, TK 1 T PO  Q 6 H PRN P, Disp: , Rfl:  .  ONETOUCH VERIO test strip, USE TWICE DAILY EVERY DAY TO CHECK BLOOD SUGAR BEFORE BREAKFAST AND DINNER, Disp: 400 strip, Rfl: 3 .  Semaglutide-Weight Management (WEGOVY) 1.7 MG/0.75ML SOAJ, Inject 1.7 mg into the skin once a week., Disp: 3 mL, Rfl: 0 .  Vitamin D, Ergocalciferol, (DRISDOL) 1.25 MG (50000 UNIT) CAPS capsule, Take 1 capsule by mouth on Tuesday and Friday, Disp: 24 capsule, Rfl: 1 .  pravastatin (PRAVACHOL) 40 MG tablet, , Disp: , Rfl:  .  Semaglutide, 1 MG/DOSE, (OZEMPIC, 1 MG/DOSE,) 4 MG/3ML SOPN, Inject 1 mg into the skin once a week., Disp: 9 mL, Rfl: 2   Allergies  Allergen Reactions  . Eggs Or Egg-Derived Products     Allergic to the protein in the egg white  . Latex   . Other Itching    Other reaction(s): Unknown  . Penicillins   . Shellfish Allergy Hives, Itching, Photosensitivity and Rash  . Shiitake Mushroom Hives, Itching, Photosensitivity and Rash     Review of Systems  Constitutional: Negative.   Eyes: Negative for blurred vision.  Respiratory: Negative.   Cardiovascular: Negative.  Negative for chest pain.  Gastrointestinal:  Negative.   Psychiatric/Behavioral: Negative.   All other systems reviewed and are negative.    Today's Vitals   02/10/21 1028  BP: 118/70  Pulse: 68  Temp: 98.3 F (36.8 C)  TempSrc: Oral  Weight: 228 lb (103.4 kg)  Height: 5' 4.7" (1.643 m)   Body mass index is 38.29 kg/m.  Wt Readings from Last 3 Encounters:  02/10/21 228 lb (103.4 kg)  12/31/20 229 lb (103.9 kg)  10/07/20 236 lb 3.2 oz (107.1 kg)   Objective:  Physical Exam Vitals and nursing note reviewed.  Constitutional:      Appearance: Normal appearance. She is obese.  HENT:     Head: Normocephalic and  atraumatic.     Nose:     Comments: Masked     Mouth/Throat:     Comments: Masked  Cardiovascular:     Rate and Rhythm: Normal rate and regular rhythm.     Heart sounds: Normal heart sounds.  Pulmonary:     Effort: Pulmonary effort is normal.     Breath sounds: Normal breath sounds.  Musculoskeletal:     Cervical back: Normal range of motion.  Skin:    General: Skin is warm.  Neurological:     General: No focal deficit present.     Mental Status: She is alert and oriented to person, place, and time.         Assessment And Plan:     1. Uncontrolled type 2 diabetes mellitus with hyperglycemia ([place]) Comments: Chronic, I will request her most recent eye exam. I will check labs as listed below.  - Hemoglobin A1c - CMP14+EGFR  2. Pure hypercholesterolemia Comments: Chronic. Previous results reviewed in full detail. Pt is aware that statin therapy is important in setting of diabetes. Encouraged to incorporate more exercise into her daily routine.   3. Class 2 severe obesity due to excess calories with serious comorbidity and body mass index (BMI) of 38.0 to 38.9 in adult [facility]) Comments: She was congratulated on her 8 pound weight loss since August 2021. We discussed use of WEgovy to augment her weight loss efforts. She verbally understands that [facility] and Ozempic are the same compound, but with different indications. I will send rx Wegovy to the pharmacy to start PA process. She is reminded that she must stop eating when she gets full to avoid GI side effects.   Patient was given opportunity to ask questions. Patient verbalized understanding of the plan and was able to repeat key elements of the plan. All questions were answered to their satisfaction.   I, Maximino Greenland, MD, have reviewed all documentation for this visit. The documentation on 02/16/21 for the exam, diagnosis, procedures, and orders are all accurate and complete.  THE PATIENT IS ENCOURAGED TO PRACTICE SOCIAL  DISTANCING DUE TO THE COVID-19 PANDEMIC.

## 2021-02-10 NOTE — Patient Instructions (Signed)
Diabetes Mellitus and Foot Care Foot care is an important part of your health, especially when you have diabetes. Diabetes may cause you to have problems because of poor blood flow (circulation) to your feet and legs, which can cause your skin to:  Become thinner and drier.  Break more easily.  Heal more slowly.  Peel and crack. You may also have nerve damage (neuropathy) in your legs and feet, causing decreased feeling in them. This means that you may not notice minor injuries to your feet that could lead to more serious problems. Noticing and addressing any potential problems early is the best way to prevent future foot problems. How to care for your feet Foot hygiene  Wash your feet daily with warm water and mild soap. Do not use hot water. Then, pat your feet and the areas between your toes until they are completely dry. Do not soak your feet as this can dry your skin.  Trim your toenails straight across. Do not dig under them or around the cuticle. File the edges of your nails with an emery board or nail file.  Apply a moisturizing lotion or petroleum jelly to the skin on your feet and to dry, brittle toenails. Use lotion that does not contain alcohol and is unscented. Do not apply lotion between your toes.   Shoes and socks  Wear clean socks or stockings every day. Make sure they are not too tight. Do not wear knee-high stockings since they may decrease blood flow to your legs.  Wear shoes that fit properly and have enough cushioning. Always look in your shoes before you put them on to be sure there are no objects inside.  To break in new shoes, wear them for just a few hours a day. This prevents injuries on your feet. Wounds, scrapes, corns, and calluses  Check your feet daily for blisters, cuts, bruises, sores, and redness. If you cannot see the bottom of your feet, use a mirror or ask someone for help.  Do not cut corns or calluses or try to remove them with medicine.  If you  find a minor scrape, cut, or break in the skin on your feet, keep it and the skin around it clean and dry. You may clean these areas with mild soap and water. Do not clean the area with peroxide, alcohol, or iodine.  If you have a wound, scrape, corn, or callus on your foot, look at it several times a day to make sure it is healing and not infected. Check for: ? Redness, swelling, or pain. ? Fluid or blood. ? Warmth. ? Pus or a bad smell.   General tips  Do not cross your legs. This may decrease blood flow to your feet.  Do not use heating pads or hot water bottles on your feet. They may burn your skin. If you have lost feeling in your feet or legs, you may not know this is happening until it is too late.  Protect your feet from hot and cold by wearing shoes, such as at the beach or on hot pavement.  Schedule a complete foot exam at least once a year (annually) or more often if you have foot problems. Report any cuts, sores, or bruises to your health care provider immediately. Where to find more information  American Diabetes Association: www.diabetes.org  Association of Diabetes Care & Education Specialists: www.diabeteseducator.org Contact a health care provider if:  You have a medical condition that increases your risk of infection and   you have any cuts, sores, or bruises on your feet.  You have an injury that is not healing.  You have redness on your legs or feet.  You feel burning or tingling in your legs or feet.  You have pain or cramps in your legs and feet.  Your legs or feet are numb.  Your feet always feel cold.  You have pain around any toenails. Get help right away if:  You have a wound, scrape, corn, or callus on your foot and: ? You have pain, swelling, or redness that gets worse. ? You have fluid or blood coming from the wound, scrape, corn, or callus. ? Your wound, scrape, corn, or callus feels warm to the touch. ? You have pus or a bad smell coming from  the wound, scrape, corn, or callus. ? You have a fever. ? You have a red line going up your leg. Summary  Check your feet every day for blisters, cuts, bruises, sores, and redness.  Apply a moisturizing lotion or petroleum jelly to the skin on your feet and to dry, brittle toenails.  Wear shoes that fit properly and have enough cushioning.  If you have foot problems, report any cuts, sores, or bruises to your health care provider immediately.  Schedule a complete foot exam at least once a year (annually) or more often if you have foot problems. This information is not intended to replace advice given to you by your health care provider. Make sure you discuss any questions you have with your health care provider. Document Revised: 06/27/2020 Document Reviewed: 06/27/2020 Elsevier Patient Education  2021 Elsevier Inc.  

## 2021-02-11 ENCOUNTER — Encounter: Payer: Self-pay | Admitting: Internal Medicine

## 2021-02-11 LAB — CMP14+EGFR
ALT: 13 IU/L (ref 0–32)
AST: 17 IU/L (ref 0–40)
Albumin/Globulin Ratio: 1.4 (ref 1.2–2.2)
Albumin: 4 g/dL (ref 3.8–4.8)
Alkaline Phosphatase: 81 IU/L (ref 44–121)
BUN/Creatinine Ratio: 13 (ref 12–28)
BUN: 10 mg/dL (ref 8–27)
Bilirubin Total: 0.2 mg/dL (ref 0.0–1.2)
CO2: 22 mmol/L (ref 20–29)
Calcium: 9.2 mg/dL (ref 8.7–10.3)
Chloride: 104 mmol/L (ref 96–106)
Creatinine, Ser: 0.79 mg/dL (ref 0.57–1.00)
GFR calc Af Amer: 92 mL/min/{1.73_m2} (ref >59)
GFR calc non Af Amer: 80 mL/min/{1.73_m2} (ref >59)
Globulin, Total: 2.8 g/dL (ref 1.5–4.5)
Glucose: 79 mg/dL (ref 65–99)
Potassium: 4.7 mmol/L (ref 3.5–5.2)
Sodium: 141 mmol/L (ref 134–144)
Total Protein: 6.8 g/dL (ref 6.0–8.5)

## 2021-02-11 LAB — HEMOGLOBIN A1C
Est. average glucose Bld gHb Est-mCnc: 128 mg/dL
Hgb A1c MFr Bld: 6.1 % — ABNORMAL HIGH (ref 4.8–5.6)

## 2021-02-13 ENCOUNTER — Encounter: Payer: Self-pay | Admitting: Internal Medicine

## 2021-02-16 ENCOUNTER — Other Ambulatory Visit (HOSPITAL_COMMUNITY): Payer: Self-pay | Admitting: Internal Medicine

## 2021-02-16 MED ORDER — WEGOVY 1.7 MG/0.75ML ~~LOC~~ SOAJ: 1.7000 mg | SUBCUTANEOUS | 0 refills | Status: DC

## 2021-02-17 ENCOUNTER — Telehealth: Payer: Self-pay

## 2021-02-17 NOTE — Telephone Encounter (Signed)
I left the pt a detailed message.

## 2021-02-17 NOTE — Telephone Encounter (Signed)
-----   Message from Glendale Chard, MD sent at 02/16/2021  1:20 AM EST ----- Is she willing to resume pravastatin?

## 2021-02-21 ENCOUNTER — Encounter: Payer: Self-pay | Admitting: Internal Medicine

## 2021-02-21 ENCOUNTER — Other Ambulatory Visit: Payer: Self-pay

## 2021-02-21 MED FILL — WEGOVY 1.7 MG/0.75ML SOAJ: 1.7 | 28 days supply | Qty: 3 | Fill #0

## 2021-02-23 ENCOUNTER — Other Ambulatory Visit: Payer: Self-pay | Admitting: Internal Medicine

## 2021-02-23 MED ORDER — PRAVASTATIN SODIUM 40 MG PO TABS: 40.0000 mg | ORAL_TABLET | Freq: Every day | ORAL | 1 refills | Status: DC

## 2021-02-24 ENCOUNTER — Other Ambulatory Visit: Payer: Self-pay

## 2021-02-24 MED ORDER — PRAVASTATIN SODIUM 40 MG PO TABS: 40.0000 mg | ORAL_TABLET | Freq: Every day | ORAL | 1 refills | Status: DC

## 2021-02-28 ENCOUNTER — Telehealth: Payer: Self-pay

## 2021-02-28 NOTE — Telephone Encounter (Signed)
Left a message message that I was calling the pt back to scheudle her an appt for [facility] teaching.  The pt left a message that she picked up the med from the pharmacy and usually did her ozempic on sundays

## 2021-03-04 ENCOUNTER — Ambulatory Visit: Payer: BC Managed Care – PPO

## 2021-03-04 ENCOUNTER — Other Ambulatory Visit: Payer: Self-pay

## 2021-03-04 VITALS — BP 128/80 | HR 72 | Temp 98.1°F | Ht 64.0 in | Wt 230.2 lb

## 2021-03-04 NOTE — Progress Notes (Signed)
Pt is here for [facility] teaching. A month follow up on medication appointment on 4/20.

## 2021-03-29 ENCOUNTER — Encounter: Payer: Self-pay | Admitting: Internal Medicine

## 2021-03-31 ENCOUNTER — Other Ambulatory Visit: Payer: Self-pay

## 2021-03-31 ENCOUNTER — Other Ambulatory Visit (HOSPITAL_COMMUNITY): Payer: Self-pay

## 2021-03-31 MED ORDER — WEGOVY 2.4 MG/0.75ML ~~LOC~~ SOAJ
2.4000 mg | SUBCUTANEOUS | 0 refills | Status: DC
  Filled 2021-03-31 (×2): qty 3, 28d supply, fill #0

## 2021-04-09 ENCOUNTER — Other Ambulatory Visit: Payer: Self-pay

## 2021-04-09 ENCOUNTER — Ambulatory Visit (INDEPENDENT_AMBULATORY_CARE_PROVIDER_SITE_OTHER): Payer: BC Managed Care – PPO | Admitting: Internal Medicine

## 2021-04-09 ENCOUNTER — Encounter: Payer: Self-pay | Admitting: Internal Medicine

## 2021-04-09 VITALS — BP 118/72 | HR 74 | Temp 98.1°F | Ht 64.0 in | Wt 224.0 lb

## 2021-04-09 DIAGNOSIS — E7800 Pure hypercholesterolemia, unspecified: Secondary | ICD-10-CM

## 2021-04-09 DIAGNOSIS — Z6838 Body mass index (BMI) 38.0-38.9, adult: Secondary | ICD-10-CM

## 2021-04-09 MED ORDER — ONETOUCH VERIO VI STRP: ORAL_STRIP | 3 refills | Status: DC

## 2021-04-09 MED ORDER — WEGOVY 2.4 MG/0.75ML ~~LOC~~ SOAJ: 2.4000 mg | SUBCUTANEOUS | 0 refills | Status: DC

## 2021-04-09 MED ORDER — WEGOVY 2.4 MG/0.75ML ~~LOC~~ SOAJ: 2.4000 mg | SUBCUTANEOUS | 3 refills | Status: DC

## 2021-04-09 NOTE — Progress Notes (Signed)
I,Katawbba Wiggins,acting as a Education administrator for Maximino Greenland, MD.,have documented all relevant documentation on the behalf of Maximino Greenland, MD,as directed by  Maximino Greenland, MD while in the presence of Maximino Greenland, MD.  This visit occurred during the SARS-CoV-2 public health emergency.  Safety protocols were in place, including screening questions prior to the visit, additional usage of staff PPE, and extensive cleaning of exam room while observing appropriate contact time as indicated for disinfecting solutions.  Subjective:     Patient ID: Stephanie French , female    DOB: 20-Jul-1956 , 65 y.o.   MRN: [id number]   Chief Complaint  Patient presents with  . Obesity    HPI  She presents today for diabetes and cholesterol check.   She reports compliance with meds. She reports doing a lot of walking at work, but she does not get exercise outside of work.   Diabetes She presents for her follow-up diabetic visit. She has type 2 diabetes mellitus. There are no hypoglycemic associated symptoms. Pertinent negatives for diabetes include no blurred vision and no chest pain. There are no hypoglycemic complications. Symptoms are stable. Risk factors for coronary artery disease include diabetes mellitus, obesity and post-menopausal. Her breakfast blood glucose is taken between 8-9 am. Her breakfast blood glucose range is generally 90-110 mg/dl. An ACE inhibitor/angiotensin II receptor blocker is not being taken.     Past Medical History:  Diagnosis Date  . Atypical chest pain 08/31/2019  . Diabetes mellitus   . Hyperlipidemia   . Obesity   . Pure hypercholesterolemia 12/07/2019  . Shortness of breath 08/31/2019     Family History  Problem Relation Age of Onset  . Diabetes Mother   . Hypertension Mother   . Stroke Mother   . Heart attack Mother   . Cancer Other   . Hypertension Other   . Hyperlipidemia Other   . Stroke Other   . Heart attack Other   . Breast cancer Maternal Grandmother    . Heart attack Maternal Grandmother   . Early death Father   . Lung cancer Father   . Diabetes Sister   . Hypertension Sister      Current Outpatient Medications:  .  pravastatin (PRAVACHOL) 40 MG tablet, Take 1 tablet (40 mg total) by mouth daily., Disp: 90 tablet, Rfl: 1 .  Vitamin D, Ergocalciferol, (DRISDOL) 1.25 MG (50000 UNIT) CAPS capsule, Take 1 capsule by mouth on Tuesday and Friday, Disp: 24 capsule, Rfl: 1 .  glucose blood (ONETOUCH VERIO) test strip, USE TWICE DAILY EVERY DAY TO CHECK BLOOD SUGAR BEFORE BREAKFAST AND DINNER, Disp: 400 strip, Rfl: 3 .  ibuprofen (ADVIL,MOTRIN) 600 MG tablet, TK 1 T PO  Q 6 H PRN P (Patient not taking: Reported on 04/09/2021), Disp: , Rfl:  .  Semaglutide-Weight Management (WEGOVY) 2.4 MG/0.75ML SOAJ, Inject 2.4 mg into the skin once a week., Disp: 9 mL, Rfl: 3   Allergies  Allergen Reactions  . Eggs Or Egg-Derived Products     Allergic to the protein in the egg white  . Latex   . Other Itching    Other reaction(s): Unknown  . Penicillins   . Shellfish Allergy Hives, Itching, Photosensitivity and Rash  . Shiitake Mushroom Hives, Itching, Photosensitivity and Rash     Review of Systems  Constitutional: Negative.   Eyes: Negative for blurred vision.  Respiratory: Negative.   Cardiovascular: Negative.  Negative for chest pain.  Gastrointestinal: Negative.   Psychiatric/Behavioral: Negative.  All other systems reviewed and are negative.    Today's Vitals   04/09/21 1604  BP: 118/72  Pulse: 74  Temp: 98.1 F (36.7 C)  TempSrc: Oral  Weight: 224 lb (101.6 kg)  Height: 5\' 4"  (1.626 m)   Body mass index is 38.45 kg/m.  Wt Readings from Last 3 Encounters:  04/09/21 224 lb (101.6 kg)  03/04/21 230 lb 3.2 oz (104.4 kg)  02/10/21 228 lb (103.4 kg)   Objective:  Physical Exam Vitals and nursing note reviewed.  Constitutional:      Appearance: Normal appearance. She is obese.  HENT:     Head: Normocephalic and atraumatic.      Nose:     Comments: Masked     Mouth/Throat:     Comments: Masked  Cardiovascular:     Rate and Rhythm: Normal rate and regular rhythm.     Heart sounds: Normal heart sounds.  Pulmonary:     Effort: Pulmonary effort is normal.     Breath sounds: Normal breath sounds.  Skin:    General: Skin is warm.  Neurological:     General: No focal deficit present.     Mental Status: She is alert.  Psychiatric:        Mood and Affect: Mood normal.        Behavior: Behavior normal.         Assessment And Plan:     1. Class 2 severe obesity due to excess calories with serious comorbidity and body mass index (BMI) of 38.0 to 38.9 in adult [facility]) Comments: She has lost 6 pounds since March 2022. She is encouraged to keep up the great work!   2. Pure hypercholesterolemia Comments: She resumed pravastatin at her last viist. I will recheck lipid panel at her next visit.   Patient was given opportunity to ask questions. Patient verbalized understanding of the plan and was able to repeat key elements of the plan. All questions were answered to their satisfaction.   I, Maximino Greenland, MD, have reviewed all documentation for this visit. The documentation on 04/13/21 for the exam, diagnosis, procedures, and orders are all accurate and complete.   IF YOU HAVE BEEN REFERRED TO A SPECIALIST, IT MAY TAKE 1-2 WEEKS TO SCHEDULE/PROCESS THE REFERRAL. IF YOU HAVE NOT HEARD FROM US/SPECIALIST IN TWO WEEKS, PLEASE GIVE [place] A CALL AT [phone].   THE PATIENT IS ENCOURAGED TO PRACTICE SOCIAL DISTANCING DUE TO THE COVID-19 PANDEMIC.

## 2021-04-09 NOTE — Patient Instructions (Signed)

## 2021-06-11 ENCOUNTER — Other Ambulatory Visit: Payer: Self-pay

## 2021-06-11 ENCOUNTER — Ambulatory Visit (INDEPENDENT_AMBULATORY_CARE_PROVIDER_SITE_OTHER): Payer: BC Managed Care – PPO | Admitting: Internal Medicine

## 2021-06-11 ENCOUNTER — Encounter: Payer: Self-pay | Admitting: Internal Medicine

## 2021-06-11 VITALS — BP 126/84 | HR 65 | Temp 98.4°F | Ht 64.0 in | Wt 220.0 lb

## 2021-06-11 DIAGNOSIS — E1165 Type 2 diabetes mellitus with hyperglycemia: Secondary | ICD-10-CM

## 2021-06-11 DIAGNOSIS — Z23 Encounter for immunization: Secondary | ICD-10-CM

## 2021-06-11 DIAGNOSIS — Z6837 Body mass index (BMI) 37.0-37.9, adult: Secondary | ICD-10-CM

## 2021-06-11 DIAGNOSIS — E7800 Pure hypercholesterolemia, unspecified: Secondary | ICD-10-CM

## 2021-06-11 MED ORDER — SHINGRIX 50 MCG/0.5ML IM SUSR: 0.5000 mL | Freq: Once | INTRAMUSCULAR | 0 refills | Status: AC

## 2021-06-11 MED ORDER — WEGOVY 2.4 MG/0.75ML ~~LOC~~ SOAJ: 2.4000 mg | SUBCUTANEOUS | 3 refills | Status: DC

## 2021-06-11 NOTE — Progress Notes (Signed)
I,Katawbba Wiggins,acting as a Education administrator for Maximino Greenland, MD.,have documented all relevant documentation on the behalf of Maximino Greenland, MD,as directed by  Maximino Greenland, MD while in the presence of Maximino Greenland, MD.  This visit occurred during the SARS-CoV-2 public health emergency.  Safety protocols were in place, including screening questions prior to the visit, additional usage of staff PPE, and extensive cleaning of exam room while observing appropriate contact time as indicated for disinfecting solutions.  Subjective:     Patient ID: Stephanie French , female    DOB: 05/03/1956 , 65 y.o.   MRN: [id number]   Chief Complaint  Patient presents with   Hyperlipidemia   Diabetes    HPI  She presents today for chol and DM check. She reports compliance with meds. She has not had any issues with pravastatin. She denies headaches, chest pain and shortness of breath.   Diabetes She presents for her follow-up diabetic visit. She has type 2 diabetes mellitus. There are no hypoglycemic associated symptoms. Pertinent negatives for diabetes include no blurred vision and no chest pain. There are no hypoglycemic complications. Symptoms are stable. Risk factors for coronary artery disease include diabetes mellitus, obesity and post-menopausal. Her breakfast blood glucose is taken between 8-9 am. Her breakfast blood glucose range is generally 90-110 mg/dl. An ACE inhibitor/angiotensin II receptor blocker is not being taken.    Past Medical History:  Diagnosis Date   Atypical chest pain 08/31/2019   Diabetes mellitus    Hyperlipidemia    Obesity    Pure hypercholesterolemia 12/07/2019   Shortness of breath 08/31/2019     Family History  Problem Relation Age of Onset   Diabetes Mother    Hypertension Mother    Stroke Mother    Heart attack Mother    Cancer Other    Hypertension Other    Hyperlipidemia Other    Stroke Other    Heart attack Other    Breast cancer Maternal Grandmother     Heart attack Maternal Grandmother    Early death Father    Lung cancer Father    Diabetes Sister    Hypertension Sister      Current Outpatient Medications:    glucose blood (ONETOUCH VERIO) test strip, USE TWICE DAILY EVERY DAY TO CHECK BLOOD SUGAR BEFORE BREAKFAST AND DINNER, Disp: 400 strip, Rfl: 3   ibuprofen (ADVIL,MOTRIN) 600 MG tablet, , Disp: , Rfl:    pravastatin (PRAVACHOL) 40 MG tablet, Take 1 tablet (40 mg total) by mouth daily., Disp: 90 tablet, Rfl: 1   Semaglutide-Weight Management (WEGOVY) 2.4 MG/0.75ML SOAJ, Inject 2.4 mg into the skin once a week., Disp: 9 mL, Rfl: 3   Vitamin D, Ergocalciferol, (DRISDOL) 1.25 MG (50000 UNIT) CAPS capsule, Take 1 capsule by mouth on Tuesday and Friday (Patient not taking: Reported on 06/11/2021), Disp: 24 capsule, Rfl: 1   Allergies  Allergen Reactions   Eggs Or Egg-Derived Products     Allergic to the protein in the egg white   Latex    Other Itching    Other reaction(s): Unknown   Penicillins    Shellfish Allergy Hives, Itching, Photosensitivity and Rash   Shiitake Mushroom Hives, Itching, Photosensitivity and Rash     Review of Systems  Constitutional: Negative.   Eyes:  Negative for blurred vision.  Respiratory: Negative.    Cardiovascular: Negative.  Negative for chest pain.  Gastrointestinal: Negative.   Psychiatric/Behavioral: Negative.    All other systems reviewed and  are negative.   Today's Vitals   06/11/21 1529  BP: 126/84  Pulse: 65  Temp: 98.4 F (36.9 C)  TempSrc: Oral  Weight: 220 lb (99.8 kg)  Height: _0  (1.626 m)   Body mass index is 37.76 kg/m.  Wt Readings from Last 3 Encounters:  06/11/21 220 lb (99.8 kg)  04/09/21 224 lb (101.6 kg)  03/04/21 230 lb 3.2 oz (104.4 kg)    BP Readings from Last 3 Encounters:  06/11/21 126/84  04/09/21 118/72  03/04/21 128/80    Objective:  Physical Exam Vitals and nursing note reviewed.  Constitutional:      Appearance: Normal appearance. She is  obese.  HENT:     Head: Normocephalic and atraumatic.     Nose:     Comments: Masked     Mouth/Throat:     Comments: Masked  Eyes:     Extraocular Movements: Extraocular movements intact.  Cardiovascular:     Rate and Rhythm: Normal rate and regular rhythm.     Heart sounds: Normal heart sounds.  Pulmonary:     Effort: Pulmonary effort is normal.     Breath sounds: Normal breath sounds.  Musculoskeletal:     Cervical back: Normal range of motion.  Skin:    General: Skin is warm.  Neurological:     General: No focal deficit present.     Mental Status: She is alert.  Psychiatric:        Mood and Affect: Mood normal.        Behavior: Behavior normal.        Assessment And Plan:     1. Pure hypercholesterolemia Comments: Chronic, she will c/w pravastatin 83m daily. Encouraged to limit her intake of fried foods.  LDL 85 in Oct 2021, I will recheck at her next visit.   2. Uncontrolled type 2 diabetes mellitus with hyperglycemia (HCC) Comments: Chronic, I will check labs as listed below. Encouraged to follow dietary guidelines.  - Hemoglobin A1c - BMP8+EGFR  3. Class 2 severe obesity due to excess calories with serious comorbidity and body mass index (BMI) of 37.0 to 37.9 in adult ([facility] Comments: [facility] I will also refill Wegovy 2.473mweekly. Encouraged to aim for at least 150 minutes of exercise OUTSIDE of work. - Amb Ref to [facility]  4. Immunization due Comments: I will send rx Shingrix to her local pharmacy.    Patient was given opportunity to ask questions. Patient verbalized understanding of the plan and was able to repeat key elements of the plan. All questions were answered to their satisfaction.   I, RoMaximino GreenlandMD, have reviewed all documentation for this visit. The documentation on 06/11/21 for the exam, diagnosis, procedures, and orders are all accurate and complete.   IF YOU HAVE BEEN REFERRED TO A SPECIALIST, IT MAY TAKE  1-2 WEEKS TO SCHEDULE/PROCESS THE REFERRAL. IF YOU HAVE NOT HEARD FROM US/SPECIALIST IN TWO WEEKS, PLEASE GIVE [place] CALL AT [phone].   THE PATIENT IS ENCOURAGED TO PRACTICE SOCIAL DISTANCING DUE TO THE COVID-19 PANDEMIC.

## 2021-06-12 LAB — BMP8+EGFR
BUN/Creatinine Ratio: 14 (ref 12–28)
BUN: 11 mg/dL (ref 8–27)
CO2: 21 mmol/L (ref 20–29)
Calcium: 9.4 mg/dL (ref 8.7–10.3)
Chloride: 102 mmol/L (ref 96–106)
Creatinine, Ser: 0.79 mg/dL (ref 0.57–1.00)
Glucose: 79 mg/dL (ref 65–99)
Potassium: 4.8 mmol/L (ref 3.5–5.2)
Sodium: 139 mmol/L (ref 134–144)
eGFR: 84 mL/min/{1.73_m2} (ref >59)

## 2021-06-12 LAB — HEMOGLOBIN A1C
Est. average glucose Bld gHb Est-mCnc: 126 mg/dL
Hgb A1c MFr Bld: 6 % — ABNORMAL HIGH (ref 4.8–5.6)

## 2021-08-26 DIAGNOSIS — Z0289 Encounter for other administrative examinations: Secondary | ICD-10-CM

## 2021-09-01 ENCOUNTER — Encounter: Payer: Self-pay | Admitting: Internal Medicine

## 2021-09-03 ENCOUNTER — Encounter (INDEPENDENT_AMBULATORY_CARE_PROVIDER_SITE_OTHER): Payer: Self-pay | Admitting: Bariatrics

## 2021-09-03 ENCOUNTER — Other Ambulatory Visit (INDEPENDENT_AMBULATORY_CARE_PROVIDER_SITE_OTHER): Payer: Self-pay | Admitting: Bariatrics

## 2021-09-03 ENCOUNTER — Other Ambulatory Visit: Payer: Self-pay

## 2021-09-03 ENCOUNTER — Ambulatory Visit (INDEPENDENT_AMBULATORY_CARE_PROVIDER_SITE_OTHER): Payer: BC Managed Care – PPO | Admitting: Bariatrics

## 2021-09-03 VITALS — BP 135/56 | HR 64 | Temp 97.8°F | Ht 64.0 in | Wt 213.0 lb

## 2021-09-03 DIAGNOSIS — R5383 Other fatigue: Secondary | ICD-10-CM

## 2021-09-03 DIAGNOSIS — R0602 Shortness of breath: Secondary | ICD-10-CM

## 2021-09-03 DIAGNOSIS — E559 Vitamin D deficiency, unspecified: Secondary | ICD-10-CM

## 2021-09-03 DIAGNOSIS — Z6836 Body mass index (BMI) 36.0-36.9, adult: Secondary | ICD-10-CM

## 2021-09-03 DIAGNOSIS — Z9189 Other specified personal risk factors, not elsewhere classified: Secondary | ICD-10-CM

## 2021-09-03 DIAGNOSIS — E669 Obesity, unspecified: Secondary | ICD-10-CM

## 2021-09-03 DIAGNOSIS — E7800 Pure hypercholesterolemia, unspecified: Secondary | ICD-10-CM

## 2021-09-03 DIAGNOSIS — E1169 Type 2 diabetes mellitus with other specified complication: Secondary | ICD-10-CM

## 2021-09-03 DIAGNOSIS — Z1331 Encounter for screening for depression: Secondary | ICD-10-CM | POA: Diagnosis not present

## 2021-09-03 NOTE — Progress Notes (Signed)
Chief Complaint:   OBESITY Stephanie French (MR# [id number]) is a 65 y.o. female who presents for evaluation and treatment of obesity and related comorbidities. Current BMI is Body mass index is 36.56 kg/m. Stephanie French has been struggling with her weight for many years and has been unsuccessful in either losing weight, maintaining weight loss, or reaching her healthy weight goal.  Stephanie French is currently in the action stage of change and ready to dedicate time achieving and maintaining a healthier weight. Stephanie French is interested in becoming our patient and working on intensive lifestyle modifications including (but not limited to) diet and exercise for weight loss.  Stephanie French struggles with finding the time to prepare her food and making good choices.   Stephanie French's habits were reviewed today and are as follows: her desired weight loss is 23 lbs, she has been heavy most of her life, she started gaining weight up and down, her heaviest weight ever was 260 pounds, she is a picky eater and doesn't like to eat healthier foods, she has significant food cravings issues, she snacks frequently in the evenings, she skips meals frequently, she is frequently drinking liquids with calories, she frequently makes poor food choices, she has problems with excessive hunger, she frequently eats larger portions than normal, and she struggles with emotional eating.  Depression Screen Stephanie French's Food and Mood (modified PHQ-9) score was 7.  Depression screen PHQ 2/9 09/03/2021  Decreased Interest 1  Down, Depressed, Hopeless 1  PHQ - 2 Score 2  Altered sleeping 1  Tired, decreased energy 1  Change in appetite 2  Feeling bad or failure about yourself  1  Trouble concentrating 0  Moving slowly or fidgety/restless 0  Suicidal thoughts 0  PHQ-9 Score 7  Difficult doing work/chores Not difficult at all   Subjective:   1. Diabetes mellitus type 2 in obese [facility]) Stephanie French is currently on Wegovy. Her last A1C level was 6.0  2. Pure  hypercholesterolemia Stephanie French is currently taking Pravastatin.  3. Other fatigue and shortness of breath Stephanie French will continue activities.  4. Vitamin D deficiency Stephanie French is currently taking Vitamin D high dose.  5. Depression screen Stephanie French was screened for depression as part of new patient work up.  6. At risk for activity intolerance Stephanie French is at risk for activity intolerance due to obesity, fatigue, SOB, joint and back pain.  Assessment/Plan:   1. Diabetes mellitus type 2 in obese Stephanie French) Stephanie French will continue taking Wegovy. We will check urine MAG and C-peptide, and insulin. Good blood sugar control is important to decrease the likelihood of diabetic complications such as nephropathy, neuropathy, limb loss, blindness, coronary artery disease, and death. Intensive lifestyle modification including diet, exercise and weight loss are the first line of treatment for diabetes.   - Microalbumin / creatinine urine ratio - Insulin, random - C-peptide  2. Pure hypercholesterolemia Stephanie French will continue medications. We will check Lipids today.  - Lipid Panel With LDL/HDL Ratio  3. Other fatigue and shortness of breath Stephanie French  feel that her weight is causing her energy to be lower than it should be. Fatigue may be related to obesity, depression or many other causes. Labs will be ordered, and in the meanwhile, [name] will focus on self care including making healthy food choices, increasing physical activity and focusing on stress reduction. Stephanie French will continue activities.  - EKG 12-Lead - Stephanie French - T4, free - TSH  4. Vitamin D deficiency Low Vitamin D level contributes to fatigue and are associated with  obesity, breast, and colon cancer. [name] agrees to continue to take prescription Vitamin D 50,000 IU on Tuesday and Friday and she will follow-up for routine testing of Vitamin D, at least 2-3 times per year to avoid over-replacement. We will check Vitamin D today.  - VITAMIN D 25 Hydroxy (Vit-D Deficiency,  Fractures)  5. Depression screen Behavior modification techniques were discussed today to help Stephanie French deal with her emotional/non-hunger eating behaviors.  Orders and follow up as documented in patient record.    6. At risk for activity intolerance Stephanie French was given approximately 15 minutes of exercise intolerance counseling today. She is 65 y.o. female and has risk factors exercise intolerance including obesity. We discussed intensive lifestyle modifications today with an emphasis on specific weight loss instructions and strategies. Stephanie French will slowly increase activity as tolerated.  Repetitive spaced learning was employed today to elicit superior memory formation and behavioral change.   7. Class 2 severe obesity with serious comorbidity and body mass index (BMI) of 36.0 to 36.9 in adult, unspecified obesity type Stephanie French) Stephanie French is currently in the action stage of change and her goal is to continue with weight loss efforts. I recommend Stephanie French begin the structured treatment plan as follows:  She has agreed to the Category 1 Plan.  Stephanie French will continue meal planning and intentional eating. We will review labs with Stephanie French from 06/11/2021. She will increase H2O and decrease junk food.  Exercise goals: No exercise has been prescribed at this time.   Behavioral modification strategies: increasing lean protein intake, decreasing simple carbohydrates, increasing vegetables, increasing water intake, decreasing eating out, no skipping meals, meal planning and cooking strategies, keeping healthy foods in the home, and planning for success.  She was informed of the importance of frequent follow-up visits to maximize her success with intensive lifestyle modifications for her multiple health conditions. She was informed we would discuss her lab results at her next visit unless there is a critical issue that needs to be addressed sooner. [name] agreed to keep her next visit at the agreed upon time to discuss these  results.  Objective:   Blood pressure (!) 135/56, pulse 64, temperature 97.8 F (36.6 C), height '5\' 4"'$  (1.626 m), weight 213 lb (96.6 kg), last menstrual period 08/12/2012, SpO2 98 %. Body mass index is 36.56 kg/m.  EKG: Normal sinus rhythm, rate 54 BPM.  Indirect Calorimeter completed today shows a VO2 of 201 and a REE of 1382.  Her calculated basal metabolic rate is [id number] thus her basal metabolic rate is worse than expected.  General: Cooperative, alert, well developed, in no acute distress. HEENT: Conjunctivae and lids unremarkable. Cardiovascular: Regular rhythm.  Lungs: Normal work of breathing. Neurologic: No focal deficits.   Lab Results  Component Value Date   CREATININE 0.79 06/11/2021   BUN 11 06/11/2021   NA 139 06/11/2021   K 4.8 06/11/2021   CL 102 06/11/2021   CO2 21 06/11/2021   Lab Results  Component Value Date   ALT 13 02/10/2021   AST 17 02/10/2021   ALKPHOS 81 02/10/2021   BILITOT 0.2 02/10/2021   Lab Results  Component Value Date   HGBA1C 6.0 (H) 06/11/2021   HGBA1C 6.1 (H) 02/10/2021   HGBA1C 6.3 (H) 10/07/2020   HGBA1C 6.0 (H) 06/20/2020   HGBA1C 5.8 (H) 02/19/2020   No results found for: INSULIN Lab Results  Component Value Date   TSH 1.515 08/02/2012   Lab Results  Component Value Date   CHOL 190 10/07/2020  HDL 93 10/07/2020   LDLCALC 85 10/07/2020   TRIG 63 10/07/2020   CHOLHDL 2.0 10/07/2020   Lab Results  Component Value Date   WBC 6.9 10/07/2020   HGB 12.1 10/07/2020   HCT 39.2 10/07/2020   MCV 72 (L) 10/07/2020   PLT 262 10/07/2020   No results found for: IRON, TIBC, FERRITIN  Attestation Statements:   Reviewed by clinician on day of visit: allergies, medications, problem list, medical history, surgical history, family history, social history, and previous encounter notes.  I, Lizbeth Bark, RMA, am acting as Location manager for [employer], DO.   I have reviewed the above documentation for accuracy and completeness,  and I agree with the above. Jearld Lesch, DO

## 2021-09-04 ENCOUNTER — Encounter (INDEPENDENT_AMBULATORY_CARE_PROVIDER_SITE_OTHER): Payer: Self-pay | Admitting: Bariatrics

## 2021-09-04 LAB — MICROALBUMIN / CREATININE URINE RATIO
Creatinine, Urine: 42.2 mg/dL
Microalb/Creat Ratio: <7 mg/g creat (ref 0–29)
Microalbumin, Urine: <3 ug/mL

## 2021-09-05 LAB — VITAMIN D 25 HYDROXY (VIT D DEFICIENCY, FRACTURES): Vit D, 25-Hydroxy: 50.6 ng/mL (ref 30.0–100.0)

## 2021-09-05 LAB — T3: T3, Total: 135 ng/dL (ref 71–180)

## 2021-09-05 LAB — LIPID PANEL WITH LDL/HDL RATIO
Cholesterol, Total: 203 mg/dL — ABNORMAL HIGH (ref 100–199)
HDL: 86 mg/dL (ref >39)
LDL Chol Calc (NIH): 108 mg/dL — ABNORMAL HIGH (ref 0–99)
LDL/HDL Ratio: 1.3 ratio (ref 0.0–3.2)
Triglycerides: 51 mg/dL (ref 0–149)
VLDL Cholesterol Cal: 9 mg/dL (ref 5–40)

## 2021-09-05 LAB — TSH: TSH: 2.2 u[IU]/mL (ref 0.450–4.500)

## 2021-09-05 LAB — INSULIN, RANDOM: INSULIN: 15.4 u[IU]/mL (ref 2.6–24.9)

## 2021-09-05 LAB — T4, FREE: Free T4: 1.22 ng/dL (ref 0.82–1.77)

## 2021-09-05 LAB — C-PEPTIDE: C-Peptide: 2.4 ng/mL (ref 1.1–4.4)

## 2021-09-17 ENCOUNTER — Encounter (INDEPENDENT_AMBULATORY_CARE_PROVIDER_SITE_OTHER): Payer: Self-pay | Admitting: Bariatrics

## 2021-09-17 ENCOUNTER — Ambulatory Visit (INDEPENDENT_AMBULATORY_CARE_PROVIDER_SITE_OTHER): Payer: BC Managed Care – PPO | Admitting: Bariatrics

## 2021-09-17 ENCOUNTER — Other Ambulatory Visit: Payer: Self-pay

## 2021-09-17 VITALS — BP 134/74 | HR 68 | Temp 97.9°F | Ht 64.0 in | Wt 207.0 lb

## 2021-09-17 DIAGNOSIS — E669 Obesity, unspecified: Secondary | ICD-10-CM | POA: Diagnosis not present

## 2021-09-17 DIAGNOSIS — E7800 Pure hypercholesterolemia, unspecified: Secondary | ICD-10-CM

## 2021-09-17 DIAGNOSIS — Z9189 Other specified personal risk factors, not elsewhere classified: Secondary | ICD-10-CM | POA: Diagnosis not present

## 2021-09-17 DIAGNOSIS — Z6836 Body mass index (BMI) 36.0-36.9, adult: Secondary | ICD-10-CM

## 2021-09-17 DIAGNOSIS — E1169 Type 2 diabetes mellitus with other specified complication: Secondary | ICD-10-CM | POA: Diagnosis not present

## 2021-09-18 ENCOUNTER — Encounter (INDEPENDENT_AMBULATORY_CARE_PROVIDER_SITE_OTHER): Payer: Self-pay | Admitting: Bariatrics

## 2021-09-18 NOTE — Progress Notes (Signed)
Chief Complaint:   OBESITY Stephanie French is here to discuss her progress with her obesity treatment plan along with follow-up of her obesity related diagnoses. Stephanie French is on the Category 1 Plan and states she is following her eating plan approximately 30% of the time. Stephanie French states she is doing 0 minutes 0 times per week.  Today's visit was #: 3 Starting weight: 213 lbs Starting date: 09/03/2021 Today's weight: 207 lbs Today's date: 09/17/2021 Total lbs lost to date: 6 lbs Total lbs lost since last in-office visit: 6 lbs  Interim History: Stephanie French is down 6 lbs since her initial visit. She states that the breakfast has been okay, but some challenges with other meals.  Subjective:   1. Diabetes mellitus type 2 in obese Stephanie French) Stephanie French is taking Stephanie French currently.  2. Pure hypercholesterolemia Stephanie French is not taking Stephanie French. She notes headaches.   3. At risk for hypoglycemia Stephanie French is at risk for hypoglycemia due to diabetes mellitus type 2 and dietary changes.   Assessment/Plan:   1. Diabetes mellitus type 2 in obese Stephanie French) Stephanie French will continue her medications. Good blood sugar control is important to decrease the likelihood of diabetic complications such as nephropathy, neuropathy, limb loss, blindness, coronary artery disease, and death. Intensive lifestyle modification including diet, exercise and weight loss are the first line of treatment for diabetes.   2. Pure hypercholesterolemia Cardiovascular risk and specific lipid/LDL goals reviewed.  We discussed several lifestyle modifications today and Gabrelle will continue to work on diet, exercise and weight loss efforts. She will consider alternate medications.Orders and follow up as documented in patient record.   Counseling Intensive lifestyle modifications are the first line treatment for this issue. Dietary changes: Increase soluble fiber. Decrease simple carbohydrates. Exercise changes: Moderate to vigorous-intensity aerobic activity 150  minutes per week if tolerated. Lipid-lowering medications: see documented in medical record.   3. At risk for hypoglycemia Stephanie French was given approximately 15 minutes of counseling today regarding prevention of hypoglycemia. She was advised of symptoms of hypoglycemia. Stephanie French was instructed to avoid skipping meals, eat regular protein rich meals and schedule low calorie snacks as needed.   Repetitive spaced learning was employed today to elicit superior memory formation and behavioral change   4. Obesity, current BMI 35.6 Stephanie French is currently in the action stage of change. As such, her goal is to continue with weight loss efforts. She has agreed to the Category 1 Plan.   Stephanie French will continue meal planning and intentional eating. We will review labs from 09/03/2021. She will increase protein. Handout on substitute eating out was given today.  Exercise goals: No exercise has been prescribed at this time.  Behavioral modification strategies: increasing lean protein intake, decreasing simple carbohydrates, increasing vegetables, increasing water intake, decreasing eating out, no skipping meals, meal planning and cooking strategies, keeping healthy foods in the home, and planning for success.  Stephanie French has agreed to follow-up with our clinic in 2 weeks. She was informed of the importance of frequent follow-up visits to maximize her success with intensive lifestyle modifications for her multiple health conditions.  Objective:   Blood pressure 134/74, pulse 68, temperature 97.9 F (36.6 C), height 5\' 4"  (1.626 m), weight 207 lb (93.9 kg), last menstrual period 08/12/2012, SpO2 98 %. Body mass index is 35.53 kg/m.  General: Cooperative, alert, well developed, in no acute distress. HEENT: Conjunctivae and lids unremarkable. Cardiovascular: Regular rhythm.  Lungs: Normal work of breathing. Neurologic: No focal deficits.   Lab Results  Component Value  Date   CREATININE 0.79 06/11/2021   BUN 11  06/11/2021   NA 139 06/11/2021   K 4.8 06/11/2021   CL 102 06/11/2021   CO2 21 06/11/2021   Lab Results  Component Value Date   ALT 13 02/10/2021   AST 17 02/10/2021   ALKPHOS 81 02/10/2021   BILITOT 0.2 02/10/2021   Lab Results  Component Value Date   HGBA1C 6.0 (H) 06/11/2021   HGBA1C 6.1 (H) 02/10/2021   HGBA1C 6.3 (H) 10/07/2020   HGBA1C 6.0 (H) 06/20/2020   HGBA1C 5.8 (H) 02/19/2020   Lab Results  Component Value Date   INSULIN 15.4 09/04/2021   Lab Results  Component Value Date   TSH 2.200 09/04/2021   Lab Results  Component Value Date   CHOL 203 (H) 09/04/2021   HDL 86 09/04/2021   LDLCALC 108 (H) 09/04/2021   TRIG 51 09/04/2021   CHOLHDL 2.0 10/07/2020   Lab Results  Component Value Date   VD25OH 50.6 09/04/2021   VD25OH 21.9 (L) 10/07/2020   VD25OH 17.8 (L) 02/19/2020   Lab Results  Component Value Date   WBC 6.9 10/07/2020   HGB 12.1 10/07/2020   HCT 39.2 10/07/2020   MCV 72 (L) 10/07/2020   PLT 262 10/07/2020   No results found for: IRON, TIBC, FERRITIN  Attestation Statements:   Reviewed by clinician on day of visit: allergies, medications, problem list, medical history, surgical history, family history, social history, and previous encounter notes.    I, Stephanie French, Stephanie French, am acting as Location manager for [employer], Stephanie French.   I have reviewed the above documentation for accuracy and completeness, and I agree with the above. Stephanie Lesch, Stephanie French

## 2021-09-29 ENCOUNTER — Encounter (INDEPENDENT_AMBULATORY_CARE_PROVIDER_SITE_OTHER): Payer: Self-pay | Admitting: Bariatrics

## 2021-09-29 ENCOUNTER — Ambulatory Visit (INDEPENDENT_AMBULATORY_CARE_PROVIDER_SITE_OTHER): Payer: BC Managed Care – PPO | Admitting: Bariatrics

## 2021-09-29 ENCOUNTER — Other Ambulatory Visit: Payer: Self-pay

## 2021-09-29 VITALS — BP 113/72 | HR 77 | Temp 98.2°F | Ht 64.0 in | Wt 207.0 lb

## 2021-09-29 DIAGNOSIS — E7800 Pure hypercholesterolemia, unspecified: Secondary | ICD-10-CM | POA: Diagnosis not present

## 2021-09-29 DIAGNOSIS — E1169 Type 2 diabetes mellitus with other specified complication: Secondary | ICD-10-CM | POA: Diagnosis not present

## 2021-09-29 DIAGNOSIS — E669 Obesity, unspecified: Secondary | ICD-10-CM

## 2021-09-29 DIAGNOSIS — Z6836 Body mass index (BMI) 36.0-36.9, adult: Secondary | ICD-10-CM

## 2021-09-30 NOTE — Progress Notes (Signed)
Chief Complaint:   OBESITY Stephanie French is here to discuss her progress with her obesity treatment plan along with follow-up of her obesity related diagnoses. Stephanie French is on the Category 1 Plan and states she is following her eating plan approximately 75% of the time. Stephanie French states she is counting steps 10,000-15,000.  Today's visit was #: 4 Starting weight: 213 lbs Starting date: 09/03/2021 Today's weight: 207 lbs Today's date: 09/29/2021 Total lbs lost to date: 6 lbs Total lbs lost since last in-office visit: 0  Interim History: Stephanie French's weight remains the same. She had several meeting and other events that have triggered some stress eating. She is getting in adequate protein. She is taking the Stephanie French without side effects.   Subjective:   1. Pure hypercholesterolemia Stephanie French is currently taking Pravachol.   2. Diabetes mellitus type 2 in obese [facility]) Stephanie French is taking [place] currently.   Assessment/Plan:   1. Pure hypercholesterolemia Cardiovascular risk and specific lipid/LDL goals reviewed.  We discussed several lifestyle modifications today and Stephanie French will continue to work on diet, exercise and weight loss efforts. Stephanie French will continue taking Pravachol. Orders and follow up as documented in patient record.   Counseling Intensive lifestyle modifications are the first line treatment for this issue. Dietary changes: Increase soluble fiber. Decrease simple carbohydrates. Exercise changes: Moderate to vigorous-intensity aerobic activity 150 minutes per week if tolerated. Lipid-lowering medications: see documented in medical record.   2. Diabetes mellitus type 2 in obese [facility]) Stephanie French will continue [phone]. Good blood sugar control is important to decrease the likelihood of diabetic complications such as nephropathy, neuropathy, limb loss, blindness, coronary artery disease, and death. Intensive lifestyle modification including diet, exercise and weight loss are the first line of treatment for  diabetes.   3. Obesity, current BMI of 35.6 Stephanie French is currently in the action stage of change. As such, her goal is to continue with weight loss efforts. She has agreed to the Category 1 Plan.   Stephanie French will continue meal planning. She will adhere closely to the plan 80-90%. She will put on her phone (water app).  Exercise goals:  Stephanie French will add bands to exercise.  Behavioral modification strategies: increasing lean protein intake, decreasing simple carbohydrates, increasing vegetables, increasing water intake, decreasing eating out, no skipping meals, meal planning and cooking strategies, keeping healthy foods in the home, and planning for success.  Stephanie French has agreed to follow-up with our clinic in 2 weeks. She was informed of the importance of frequent follow-up visits to maximize her success with intensive lifestyle modifications for her multiple health conditions.   Objective:   Blood pressure 113/72, pulse 77, temperature 98.2 F (36.8 C), height 5\' 4"  (1.626 m), weight 207 lb (93.9 kg), last menstrual period 08/12/2012, SpO2 96 %. Body mass index is 35.53 kg/m.  General: Cooperative, alert, well developed, in no acute distress. HEENT: Conjunctivae and lids unremarkable. Cardiovascular: Regular rhythm.  Lungs: Normal work of breathing. Neurologic: No focal deficits.   Lab Results  Component Value Date   CREATININE 0.79 06/11/2021   BUN 11 06/11/2021   NA 139 06/11/2021   K 4.8 06/11/2021   CL 102 06/11/2021   CO2 21 06/11/2021   Lab Results  Component Value Date   ALT 13 02/10/2021   AST 17 02/10/2021   ALKPHOS 81 02/10/2021   BILITOT 0.2 02/10/2021   Lab Results  Component Value Date   HGBA1C 6.0 (H) 06/11/2021   HGBA1C 6.1 (H) 02/10/2021   HGBA1C 6.3 (H) 10/07/2020  HGBA1C 6.0 (H) 06/20/2020   HGBA1C 5.8 (H) 02/19/2020   Lab Results  Component Value Date   INSULIN 15.4 09/04/2021   Lab Results  Component Value Date   TSH 2.200 09/04/2021   Lab Results   Component Value Date   CHOL 203 (H) 09/04/2021   HDL 86 09/04/2021   LDLCALC 108 (H) 09/04/2021   TRIG 51 09/04/2021   CHOLHDL 2.0 10/07/2020   Lab Results  Component Value Date   VD25OH 50.6 09/04/2021   VD25OH 21.9 (L) 10/07/2020   VD25OH 17.8 (L) 02/19/2020   Lab Results  Component Value Date   WBC 6.9 10/07/2020   HGB 12.1 10/07/2020   HCT 39.2 10/07/2020   MCV 72 (L) 10/07/2020   PLT 262 10/07/2020   No results found for: IRON, TIBC, FERRITIN  Attestation Statements:   Reviewed by clinician on day of visit: allergies, medications, problem list, medical history, surgical history, family history, social history, and previous encounter notes.  I, Lizbeth Bark, RMA, am acting as Location manager for [employer], DO.   I have reviewed the above documentation for accuracy and completeness, and I agree with the above. Jearld Lesch, DO

## 2021-10-01 ENCOUNTER — Encounter (INDEPENDENT_AMBULATORY_CARE_PROVIDER_SITE_OTHER): Payer: Self-pay | Admitting: Bariatrics

## 2021-10-13 ENCOUNTER — Other Ambulatory Visit (HOSPITAL_COMMUNITY)
Admission: RE | Admit: 2021-10-13 | Discharge: 2021-10-13 | Disposition: A | Discharge status: Home / Self Care | Payer: BC Managed Care – PPO | Source: Ambulatory Visit | Attending: Internal Medicine | Admitting: Internal Medicine

## 2021-10-13 ENCOUNTER — Ambulatory Visit (INDEPENDENT_AMBULATORY_CARE_PROVIDER_SITE_OTHER): Payer: BC Managed Care – PPO | Admitting: Internal Medicine

## 2021-10-13 ENCOUNTER — Encounter: Payer: Self-pay | Admitting: Internal Medicine

## 2021-10-13 ENCOUNTER — Other Ambulatory Visit: Payer: Self-pay

## 2021-10-13 VITALS — BP 112/70 | HR 59 | Temp 98.1°F | Ht 64.0 in | Wt 208.0 lb

## 2021-10-13 DIAGNOSIS — R87618 Other abnormal cytological findings on specimens from cervix uteri: Secondary | ICD-10-CM | POA: Diagnosis not present

## 2021-10-13 DIAGNOSIS — E1165 Type 2 diabetes mellitus with hyperglycemia: Secondary | ICD-10-CM

## 2021-10-13 DIAGNOSIS — Z23 Encounter for immunization: Secondary | ICD-10-CM

## 2021-10-13 DIAGNOSIS — I8311 Varicose veins of right lower extremity with inflammation: Secondary | ICD-10-CM

## 2021-10-13 DIAGNOSIS — I8312 Varicose veins of left lower extremity with inflammation: Secondary | ICD-10-CM | POA: Diagnosis not present

## 2021-10-13 DIAGNOSIS — Z6835 Body mass index (BMI) 35.0-35.9, adult: Secondary | ICD-10-CM

## 2021-10-13 DIAGNOSIS — K644 Residual hemorrhoidal skin tags: Secondary | ICD-10-CM

## 2021-10-13 DIAGNOSIS — Z0000 Encounter for general adult medical examination without abnormal findings: Secondary | ICD-10-CM

## 2021-10-13 LAB — CMP14+EGFR
ALT: 16 IU/L (ref 0–32)
AST: 22 IU/L (ref 0–40)
Albumin/Globulin Ratio: 1.6 (ref 1.2–2.2)
Albumin: 4.5 g/dL (ref 3.8–4.8)
Alkaline Phosphatase: 81 IU/L (ref 44–121)
BUN/Creatinine Ratio: 15 (ref 12–28)
BUN: 14 mg/dL (ref 8–27)
Bilirubin Total: 0.3 mg/dL (ref 0.0–1.2)
CO2: 22 mmol/L (ref 20–29)
Calcium: 9.7 mg/dL (ref 8.7–10.3)
Chloride: 105 mmol/L (ref 96–106)
Creatinine, Ser: 0.91 mg/dL (ref 0.57–1.00)
Globulin, Total: 2.8 g/dL (ref 1.5–4.5)
Glucose: 74 mg/dL (ref 70–99)
Potassium: 4.6 mmol/L (ref 3.5–5.2)
Sodium: 142 mmol/L (ref 134–144)
Total Protein: 7.3 g/dL (ref 6.0–8.5)
eGFR: 71 mL/min/{1.73_m2} (ref >59)

## 2021-10-13 LAB — CBC
Hematocrit: 37.1 % (ref 34.0–46.6)
Hemoglobin: 12.3 g/dL (ref 11.1–15.9)
MCH: 22.4 pg — ABNORMAL LOW (ref 26.6–33.0)
MCHC: 33.2 g/dL (ref 31.5–35.7)
MCV: 68 fL — ABNORMAL LOW (ref 79–97)
Platelets: 202 10*3/uL (ref 150–450)
RBC: 5.48 x10E6/uL — ABNORMAL HIGH (ref 3.77–5.28)
RDW: 14.6 % (ref 11.7–15.4)
WBC: 4.9 10*3/uL (ref 3.4–10.8)

## 2021-10-13 LAB — POCT URINALYSIS DIPSTICK
Bilirubin, UA: NEGATIVE
Blood, UA: NEGATIVE
Glucose, UA: NEGATIVE
Ketones, UA: NEGATIVE
Leukocytes, UA: NEGATIVE
Nitrite, UA: NEGATIVE
Protein, UA: NEGATIVE
Spec Grav, UA: 1.02 (ref 1.010–1.025)
Urobilinogen, UA: 0.2 E.U./dL
pH, UA: 8.5 — AB (ref 5.0–8.0)

## 2021-10-13 LAB — POCT UA - MICROALBUMIN
Albumin/Creatinine Ratio, Urine, POC: <30
Creatinine, POC: 300 mg/dL
Microalbumin Ur, POC: 10 mg/L

## 2021-10-13 LAB — POC HEMOCCULT BLD/STL (OFFICE/1-CARD/DIAGNOSTIC): Fecal Occult Blood, POC: NEGATIVE

## 2021-10-13 LAB — HEMOGLOBIN A1C
Est. average glucose Bld gHb Est-mCnc: 120 mg/dL
Hgb A1c MFr Bld: 5.8 % — ABNORMAL HIGH (ref 4.8–5.6)

## 2021-10-13 MED ORDER — HYDROCORTISONE (PERIANAL) 2.5 % EX CREA: 1.0000 "application " | TOPICAL_CREAM | Freq: Two times a day (BID) | CUTANEOUS | 0 refills | Status: AC | PRN

## 2021-10-13 NOTE — Progress Notes (Signed)
I,Katawbba Wiggins,acting as a Education administrator for Maximino Greenland, MD.,have documented all relevant documentation on the behalf of Maximino Greenland, MD,as directed by  Maximino Greenland, MD while in the presence of Maximino Greenland, MD.  This visit occurred during the SARS-CoV-2 public health emergency.  Safety protocols were in place, including screening questions prior to the visit, additional usage of staff PPE, and extensive cleaning of exam room while observing appropriate contact time as indicated for disinfecting solutions.  Subjective:     Patient ID: Stephanie French , female    DOB: 10-05-1956 , 65 y.o.   MRN: [id number]   Chief Complaint  Patient presents with  . Annual Exam  . Diabetes    HPI  She is here today for a full physical examination. She was referred to GYN last year b/c of POS HPV. She is due for repeat Pap at this time. She has no other concerns at this time.   Diabetes She presents for her follow-up diabetic visit. She has type 2 diabetes mellitus. There are no hypoglycemic associated symptoms. Pertinent negatives for diabetes include no blurred vision and no chest pain. There are no hypoglycemic complications. Risk factors for coronary artery disease include diabetes mellitus, dyslipidemia, obesity, post-menopausal and sedentary lifestyle. She participates in exercise intermittently. Eye exam is not current.    Past Medical History:  Diagnosis Date  . Anemia   . Anxiety   . Atypical chest pain 08/31/2019  . Back pain   . Constipation   . Depression   . Diabetes mellitus   . Edema of both lower extremities   . GERD (gastroesophageal reflux disease)   . Hyperlipidemia   . Joint pain   . Obesity   . Pure hypercholesterolemia 12/07/2019  . Shortness of breath 08/31/2019  . Vitamin D deficiency      Family History  Problem Relation Age of Onset  . Diabetes Mother   . Hypertension Mother   . Stroke Mother   . Heart attack Mother   . High Cholesterol Mother   .  Early death Father   . Lung cancer Father   . Diabetes Sister   . Hypertension Sister   . Breast cancer Maternal Grandmother   . Heart attack Maternal Grandmother   . Cancer Other   . Hypertension Other   . Hyperlipidemia Other   . Stroke Other   . Heart attack Other      Current Outpatient Medications:  .  hydrocortisone (ANUSOL-HC) 2.5 % rectal cream, Place 1 application rectally 2 (two) times daily as needed for hemorrhoids or anal itching., Disp: 30 g, Rfl: 0 .  Multiple Vitamin (MULTIVITAMIN) capsule, Take 1 capsule by mouth daily. For 50 plus, Disp: , Rfl:  .  pravastatin (PRAVACHOL) 40 MG tablet, Take 1 tablet (40 mg total) by mouth daily., Disp: 90 tablet, Rfl: 1 .  Semaglutide-Weight Management (WEGOVY) 2.4 MG/0.75ML SOAJ, Inject 2.4 mg into the skin once a week., Disp: 9 mL, Rfl: 3   Allergies  Allergen Reactions  . Eggs Or Egg-Derived Products     Allergic to the protein in the egg white  . Latex   . Other Itching    Other reaction(s): Unknown  . Penicillins   . Shellfish Allergy Hives, Itching, Photosensitivity and Rash  . Shiitake Mushroom Hives, Itching, Photosensitivity and Rash      The patient states she uses post menopausal status for birth control. Last LMP was Patient's last menstrual period was 08/12/2012.. Negative  for Dysmenorrhea. Negative for: breast discharge, breast lump(s), breast pain and breast self exam. Associated symptoms include abnormal vaginal bleeding. Pertinent negatives include abnormal bleeding (hematology), anxiety, decreased libido, depression, difficulty falling sleep, dyspareunia, history of infertility, nocturia, sexual dysfunction, sleep disturbances, urinary incontinence, urinary urgency, vaginal discharge and vaginal itching. Diet regular.The patient states her exercise level is    . The patient's tobacco use is:  Social History   Tobacco Use  Smoking Status Former  . Packs/day: 0.10  . Years: 40.00  . Pack years: 4.00  .  Types: Cigarettes  Smokeless Tobacco Never  . She has been exposed to passive smoke. The patient's alcohol use is:  Social History   Substance and Sexual Activity  Alcohol Use No   Review of Systems  Constitutional: Negative.   HENT: Negative.    Eyes: Negative.  Negative for blurred vision.  Respiratory: Negative.    Cardiovascular: Negative.  Negative for chest pain.  Endocrine: Negative.   Genitourinary: Negative.   Musculoskeletal: Negative.   Skin: Negative.   Allergic/Immunologic: Negative.   Neurological: Negative.   Hematological: Negative.   Psychiatric/Behavioral: Negative.      Today's Vitals   10/13/21 1032  BP: 112/70  Pulse: (!) 59  Temp: 98.1 F (36.7 C)  Weight: 208 lb (94.3 kg)  Height: 5' 4" (1.626 m)   Body mass index is 35.7 kg/m.  Wt Readings from Last 3 Encounters:  10/13/21 208 lb (94.3 kg)  09/29/21 207 lb (93.9 kg)  09/17/21 207 lb (93.9 kg)    BP Readings from Last 3 Encounters:  10/13/21 112/70  09/29/21 113/72  09/17/21 134/74    Objective:  Physical Exam Vitals and nursing note reviewed. Exam conducted with a chaperone present.  Constitutional:      Appearance: Normal appearance.  HENT:     Head: Normocephalic and atraumatic.     Right Ear: Tympanic membrane, ear canal and external ear normal.     Left Ear: Tympanic membrane, ear canal and external ear normal.     Nose:     Comments: Masked     Mouth/Throat:     Comments: Masked  Eyes:     Extraocular Movements: Extraocular movements intact.     Conjunctiva/sclera: Conjunctivae normal.     Pupils: Pupils are equal, round, and reactive to light.  Cardiovascular:     Rate and Rhythm: Normal rate and regular rhythm.     Pulses: Normal pulses.          Dorsalis pedis pulses are 2+ on the right side and 2+ on the left side.     Heart sounds: Normal heart sounds.     Comments: Venous varicosities b/l LE Pulmonary:     Effort: Pulmonary effort is normal.     Breath sounds:  Normal breath sounds.  Abdominal:     General: Bowel sounds are normal.     Palpations: Abdomen is soft.     Comments: Obese, soft   Genitourinary:    General: Normal vulva.     Exam position: Lithotomy position.     Tanner stage (genital): 5.     Labia:        Right: No lesion.        Left: No lesion.      Vagina: Normal.     Uterus: Absent.      Adnexa: Right adnexa normal and left adnexa normal.     Rectum: Guaiac result positive. External hemorrhoid present. No tenderness.       Comments: deferred Musculoskeletal:        General: Normal range of motion.     Cervical back: Normal range of motion and neck supple.     Right lower leg: No edema.     Left lower leg: No edema.  Feet:     Right foot:     Protective Sensation: 5 sites tested.  5 sites sensed.     Skin integrity: Dry skin present.     Toenail Condition: Right toenails are normal.     Left foot:     Protective Sensation: 5 sites tested.  5 sites sensed.     Skin integrity: Dry skin present.     Toenail Condition: Left toenails are normal.  Lymphadenopathy:     Lower Body: No right inguinal adenopathy. No left inguinal adenopathy.  Skin:    General: Skin is warm and dry.  Neurological:     General: No focal deficit present.     Mental Status: She is alert and oriented to person, place, and time.  Psychiatric:        Mood and Affect: Mood normal.        Behavior: Behavior normal.        Assessment And Plan:     1. Encounter for annual physical exam Comments: A full exam was performed. Importance of monthly self breast exams was discussed with the patient.  I DISCUSSED WITH THE PATIENT AT LENGTH REGARDING THE GOALS OF GLYCEMIC CONTROL AND POSSIBLE LONG-TERM COMPLICATIONS.  I  ALSO STRESSED THE IMPORTANCE OF COMPLIANCE WITH HOME GLUCOSE MONITORING, DIETARY RESTRICTIONS INCLUDING AVOIDANCE OF SUGARY DRINKS/PROCESSED FOODS,  ALONG WITH REGULAR EXERCISE.  I  ALSO STRESSED THE IMPORTANCE OF ANNUAL EYE EXAMS, SELF FOOT  CARE AND COMPLIANCE WITH OFFICE VISITS.  - Hemoglobin A1c - CBC - CMP14+EGFR - POC Hemoccult Bld/Stl (1-Cd Office Dx)  2. Pap smear abnormality of cervix/human papillomavirus (HPV) positive Comments: Pap smear performed. Stool heme positive. She has plans to f/u with GYN.  - Cytology -Pap Smear  3. Uncontrolled type 2 diabetes mellitus with hyperglycemia ([facility]) Comments: Diabetic foot exam was performed. I DISCUSSED WITH THE PATIENT AT LENGTH REGARDING THE GOALS OF GLYCEMIC CONTROL AND POSSIBLE LONG-TERM COMPLICATIONS.  I  ALSO STRESSED THE IMPORTANCE OF COMPLIANCE WITH HOME GLUCOSE MONITORING, DIETARY RESTRICTIONS INCLUDING AVOIDANCE OF SUGARY DRINKS/PROCESSED FOODS,  ALONG WITH REGULAR EXERCISE.  I  ALSO STRESSED THE IMPORTANCE OF ANNUAL EYE EXAMS, SELF FOOT CARE AND COMPLIANCE WITH OFFICE VISITS.  - POCT Urinalysis Dipstick (81002) - POCT UA - Microalbumin - Multiple Vitamin (MULTIVITAMIN) capsule; Take 1 capsule by mouth daily. For 50 plus  4. Varicose veins of both lower extremities with inflammation Comments: I will refer her to Vascular for further evaluation. She is encouraged to wear compression hose, especially while at work.  - Ambulatory referral to Vascular Surgery  5. External hemorrhoids Comments: I will send in rx Anusol to her local pharmacy.  - hydrocortisone (ANUSOL-HC) 2.5 % rectal cream; Place 1 application rectally 2 (two) times daily as needed for hemorrhoids or anal itching.  Dispense: 30 g; Refill: 0  6. Class 2 severe obesity due to excess calories with serious comorbidity and body mass index (BMI) of 35.0 to 35.9 in adult [facility]) Comments: She is encouraged to strive for BMI less than 30 to decrease cardiac risk. [facility]   7. Need for vaccination Comments: She wass given Shingrix to update her immunization history.  - Varicella-zoster vaccine  IM (Shingrix)  Patient was given opportunity to ask questions. Patient verbalized  understanding of the plan and was able to repeat key elements of the plan. All questions were answered to their satisfaction.   I, Maximino Greenland, MD, have reviewed all documentation for this visit. The documentation on 10/13/21 for the exam, diagnosis, procedures, and orders are all accurate and complete.   THE PATIENT IS ENCOURAGED TO PRACTICE SOCIAL DISTANCING DUE TO THE COVID-19 PANDEMIC.

## 2021-10-13 NOTE — Patient Instructions (Signed)
Health Maintenance, Female Adopting a healthy lifestyle and getting preventive care are important in promoting health and wellness. Ask your health care provider about: The right schedule for you to have regular tests and exams. Things you can do on your own to prevent diseases and keep yourself healthy. What should I know about diet, weight, and exercise? Eat a healthy diet  Eat a diet that includes plenty of vegetables, fruits, low-fat dairy products, and lean protein. Do not eat a lot of foods that are high in solid fats, added sugars, or sodium. Maintain a healthy weight Body mass index (BMI) is used to identify weight problems. It estimates body fat based on height and weight. Your health care provider can help determine your BMI and help you achieve or maintain a healthy weight. Get regular exercise Get regular exercise. This is one of the most important things you can do for your health. Most adults should: Exercise for at least 150 minutes each week. The exercise should increase your heart rate and make you sweat (moderate-intensity exercise). Do strengthening exercises at least twice a week. This is in addition to the moderate-intensity exercise. Spend less time sitting. Even light physical activity can be beneficial. Watch cholesterol and blood lipids Have your blood tested for lipids and cholesterol at 64 years of age, then have this test every 5 years. Have your cholesterol levels checked more often if: Your lipid or cholesterol levels are high. You are older than 64 years of age. You are at high risk for heart disease. What should I know about cancer screening? Depending on your health history and family history, you may need to have cancer screening at various ages. This may include screening for: Breast cancer. Cervical cancer. Colorectal cancer. Skin cancer. Lung cancer. What should I know about heart disease, diabetes, and high blood pressure? Blood pressure and heart  disease High blood pressure causes heart disease and increases the risk of stroke. This is more likely to develop in people who have high blood pressure readings, are of African descent, or are overweight. Have your blood pressure checked: Every 3-5 years if you are 18-39 years of age. Every year if you are 40 years old or older. Diabetes Have regular diabetes screenings. This checks your fasting blood sugar level. Have the screening done: Once every three years after age 40 if you are at a normal weight and have a low risk for diabetes. More often and at a younger age if you are overweight or have a high risk for diabetes. What should I know about preventing infection? Hepatitis B If you have a higher risk for hepatitis B, you should be screened for this virus. Talk with your health care provider to find out if you are at risk for hepatitis B infection. Hepatitis C Testing is recommended for: Everyone born from 1945 through 1965. Anyone with known risk factors for hepatitis C. Sexually transmitted infections (STIs) Get screened for STIs, including gonorrhea and chlamydia, if: You are sexually active and are younger than 64 years of age. You are older than 64 years of age and your health care provider tells you that you are at risk for this type of infection. Your sexual activity has changed since you were last screened, and you are at increased risk for chlamydia or gonorrhea. Ask your health care provider if you are at risk. Ask your health care provider about whether you are at high risk for HIV. Your health care provider may recommend a prescription medicine   to help prevent HIV infection. If you choose to take medicine to prevent HIV, you should first get tested for HIV. You should then be tested every 3 months for as long as you are taking the medicine. Pregnancy If you are about to stop having your period (premenopausal) and you may become pregnant, seek counseling before you get  pregnant. Take 400 to 800 micrograms (mcg) of folic acid every day if you become pregnant. Ask for birth control (contraception) if you want to prevent pregnancy. Osteoporosis and menopause Osteoporosis is a disease in which the bones lose minerals and strength with aging. This can result in bone fractures. If you are 65 years old or older, or if you are at risk for osteoporosis and fractures, ask your health care provider if you should: Be screened for bone loss. Take a calcium or vitamin D supplement to lower your risk of fractures. Be given hormone replacement therapy (HRT) to treat symptoms of menopause. Follow these instructions at home: Lifestyle Do not use any products that contain nicotine or tobacco, such as cigarettes, e-cigarettes, and chewing tobacco. If you need help quitting, ask your health care provider. Do not use street drugs. Do not share needles. Ask your health care provider for help if you need support or information about quitting drugs. Alcohol use Do not drink alcohol if: Your health care provider tells you not to drink. You are pregnant, may be pregnant, or are planning to become pregnant. If you drink alcohol: Limit how much you use to 0-1 drink a day. Limit intake if you are breastfeeding. Be aware of how much alcohol is in your drink. In the [place] one drink equals one 12 oz bottle of beer (355 mL), one 5 oz glass of wine (148 mL), or one 1 oz glass of hard liquor (44 mL). General instructions Schedule regular health, dental, and eye exams. Stay current with your vaccines. Tell your health care provider if: You often feel depressed. You have ever been abused or do not feel safe at home. Summary Adopting a healthy lifestyle and getting preventive care are important in promoting health and wellness. Follow your health care provider's instructions about healthy diet, exercising, and getting tested or screened for diseases. Follow your health care provider's  instructions on monitoring your cholesterol and blood pressure. This information is not intended to replace advice given to you by your health care provider. Make sure you discuss any questions you have with your health care provider. Document Revised: 02/14/2021 Document Reviewed: 11/30/2018 Elsevier Patient Education  2022 [employer] Inc.  

## 2021-10-17 LAB — CYTOLOGY - PAP
Comment: NEGATIVE
Diagnosis: NEGATIVE
High risk HPV: NEGATIVE

## 2021-10-20 ENCOUNTER — Ambulatory Visit (INDEPENDENT_AMBULATORY_CARE_PROVIDER_SITE_OTHER): Payer: BC Managed Care – PPO | Admitting: Bariatrics

## 2021-10-20 ENCOUNTER — Encounter (INDEPENDENT_AMBULATORY_CARE_PROVIDER_SITE_OTHER): Payer: Self-pay | Admitting: Bariatrics

## 2021-10-20 ENCOUNTER — Other Ambulatory Visit: Payer: Self-pay

## 2021-10-20 VITALS — BP 119/76 | HR 63 | Temp 97.7°F | Ht 64.0 in | Wt 206.0 lb

## 2021-10-20 DIAGNOSIS — E669 Obesity, unspecified: Secondary | ICD-10-CM | POA: Diagnosis not present

## 2021-10-20 DIAGNOSIS — E7800 Pure hypercholesterolemia, unspecified: Secondary | ICD-10-CM

## 2021-10-20 DIAGNOSIS — Z6836 Body mass index (BMI) 36.0-36.9, adult: Secondary | ICD-10-CM

## 2021-10-20 DIAGNOSIS — E1169 Type 2 diabetes mellitus with other specified complication: Secondary | ICD-10-CM

## 2021-10-20 NOTE — Progress Notes (Signed)
Chief Complaint:   OBESITY Stephanie French is here to discuss her progress with her obesity treatment plan along with follow-up of her obesity related diagnoses. Stephanie French is on the Category 1 Plan and states she is following her eating plan approximately 60% of the time. Stephanie French states she is using bands for 10 minutes 3 times per week.  Today's visit was #: 5 Starting weight: 213 lbs Starting date: 09/03/2021 Today's weight: 206 lbs Today's date: 10/20/2021 Total lbs lost to date: 7 lbs Total lbs lost since last in-office visit: 2 lbs  Interim History: Stephanie French is down an additional 2 lbs. She struggles with sweets as usual.  Subjective:   1. Diabetes mellitus type 2 in obese Stephanie French) Kadience is taking Stephanie French. Her last A1C level was 5.8.  2. Pure hypercholesterolemia Stephanie French is currently taking Stephanie French.   Assessment/Plan:   1. Diabetes mellitus type 2 in obese Stephanie French) Stephanie French will continue taking Stephanie French. Good blood sugar control is important to decrease the likelihood of diabetic complications such as nephropathy, neuropathy, limb loss, blindness, coronary artery disease, and death. Intensive lifestyle modification including diet, exercise and weight loss are the first line of treatment for diabetes.    2. Pure hypercholesterolemia Cardiovascular risk and specific lipid/LDL goals reviewed.  We discussed several lifestyle modifications today and Stephanie French will continue to work on diet, exercise and weight loss efforts. Stephanie French will continue Stephanie French. Orders and follow up as documented in patient record.   Counseling Intensive lifestyle modifications are the first line treatment for this issue. Dietary changes: Increase soluble fiber. Decrease simple carbohydrates. Exercise changes: Moderate to vigorous-intensity aerobic activity 150 minutes per week if tolerated. Lipid-lowering medications: see documented in medical record.   3. Obesity BMI today is 19 Stephanie French is currently in the action stage of change.  As such, her goal is to continue with weight loss efforts. She has agreed to the Category 1 Plan and keeping a food journal and adhering to recommended goals of 1000 calories and 70-80 grams of protein.   Stephanie French will adhere closely to the plan at 80-90%. She will be mindful eating and meal planning.   Exercise goals:  As is.   Behavioral modification strategies: increasing lean protein intake, decreasing simple carbohydrates, increasing vegetables, increasing water intake, decreasing eating out, no skipping meals, meal planning and cooking strategies, keeping healthy foods in the home, and planning for success.  Stephanie French has agreed to follow-up with our clinic in 2 weeks. She was informed of the importance of frequent follow-up visits to maximize her success with intensive lifestyle modifications for her multiple health conditions.   Objective:   Blood pressure 119/76, pulse 63, temperature 97.7 F (36.5 C), height 5\' 4"  (1.626 m), weight 206 lb (93.4 kg), last menstrual period 08/12/2012, SpO2 97 %. Body mass index is 35.36 kg/m.  General: Cooperative, alert, well developed, in no acute distress. HEENT: Conjunctivae and lids unremarkable. Cardiovascular: Regular rhythm.  Lungs: Normal work of breathing. Neurologic: No focal deficits.   Lab Results  Component Value Date   CREATININE 0.91 10/13/2021   BUN 14 10/13/2021   NA 142 10/13/2021   K 4.6 10/13/2021   CL 105 10/13/2021   CO2 22 10/13/2021   Lab Results  Component Value Date   ALT 16 10/13/2021   AST 22 10/13/2021   ALKPHOS 81 10/13/2021   BILITOT 0.3 10/13/2021   Lab Results  Component Value Date   HGBA1C 5.8 (H) 10/13/2021   HGBA1C 6.0 (H) 06/11/2021  HGBA1C 6.1 (H) 02/10/2021   HGBA1C 6.3 (H) 10/07/2020   HGBA1C 6.0 (H) 06/20/2020   Lab Results  Component Value Date   INSULIN 15.4 09/04/2021   Lab Results  Component Value Date   TSH 2.200 09/04/2021   Lab Results  Component Value Date   CHOL 203 (H)  09/04/2021   HDL 86 09/04/2021   LDLCALC 108 (H) 09/04/2021   TRIG 51 09/04/2021   CHOLHDL 2.0 10/07/2020   Lab Results  Component Value Date   VD25OH 50.6 09/04/2021   VD25OH 21.9 (L) 10/07/2020   VD25OH 17.8 (L) 02/19/2020   Lab Results  Component Value Date   WBC 4.9 10/13/2021   HGB 12.3 10/13/2021   HCT 37.1 10/13/2021   MCV 68 (L) 10/13/2021   PLT 202 10/13/2021   No results found for: IRON, TIBC, FERRITIN  Attestation Statements:   Reviewed by clinician on day of visit: allergies, medications, problem list, medical history, surgical history, family history, social history, and previous encounter notes.  I, Lizbeth Bark, RMA, am acting as Location manager for [employer], DO.   I have reviewed the above documentation for accuracy and completeness, and I agree with the above. Stephanie Lesch, DO

## 2021-10-21 ENCOUNTER — Encounter (INDEPENDENT_AMBULATORY_CARE_PROVIDER_SITE_OTHER): Payer: Self-pay | Admitting: Bariatrics

## 2021-10-29 ENCOUNTER — Other Ambulatory Visit: Payer: Self-pay

## 2021-10-29 DIAGNOSIS — I8390 Asymptomatic varicose veins of unspecified lower extremity: Secondary | ICD-10-CM

## 2021-11-05 ENCOUNTER — Other Ambulatory Visit: Payer: Self-pay

## 2021-11-05 ENCOUNTER — Encounter (INDEPENDENT_AMBULATORY_CARE_PROVIDER_SITE_OTHER): Payer: Self-pay | Admitting: Bariatrics

## 2021-11-05 ENCOUNTER — Ambulatory Visit (INDEPENDENT_AMBULATORY_CARE_PROVIDER_SITE_OTHER): Payer: BC Managed Care – PPO | Admitting: Bariatrics

## 2021-11-05 VITALS — BP 135/74 | HR 68 | Temp 97.5°F | Ht 64.0 in | Wt 204.0 lb

## 2021-11-05 DIAGNOSIS — E1169 Type 2 diabetes mellitus with other specified complication: Secondary | ICD-10-CM | POA: Diagnosis not present

## 2021-11-05 DIAGNOSIS — E669 Obesity, unspecified: Secondary | ICD-10-CM | POA: Diagnosis not present

## 2021-11-05 DIAGNOSIS — Z6836 Body mass index (BMI) 36.0-36.9, adult: Secondary | ICD-10-CM

## 2021-11-05 DIAGNOSIS — E7800 Pure hypercholesterolemia, unspecified: Secondary | ICD-10-CM

## 2021-11-06 ENCOUNTER — Encounter (INDEPENDENT_AMBULATORY_CARE_PROVIDER_SITE_OTHER): Payer: Self-pay | Admitting: Bariatrics

## 2021-11-06 NOTE — Progress Notes (Signed)
Chief Complaint:   OBESITY Stephanie French is here to discuss her progress with her obesity treatment plan along with follow-up of her obesity related diagnoses. Stephanie French is on the Category 1 Plan and states she is following her eating plan approximately 40% of the time. Stephanie French states she is doing 0 minutes 0 times per week.  Today's visit was #: 6 Starting weight: 213 lbs Starting date: 09/03/2021 Today's weight: 204 lbs Today's date: 11/06/2021 Total lbs lost to date: 9 lbs Total lbs lost since last in-office visit: 2 lbs  Interim History: Stephanie French is down 2 additional pounds since her last visit. She is working on her water and protein intake.  Subjective:   1. Diabetes mellitus type 2 in obese [facility]) Stephanie French is currently taking Wegovy. Her last A1C level was 5.8 which is getting better.  2. Pure hypercholesterolemia Stephanie French is taking Pravachol currently.  Assessment/Plan:   1. Diabetes mellitus type 2 in obese [facility]) Stephanie French will continue her medications. Good blood sugar control is important to decrease the likelihood of diabetic complications such as nephropathy, neuropathy, limb loss, blindness, coronary artery disease, and death. Intensive lifestyle modification including diet, exercise and weight loss are the first line of treatment for diabetes.   2. Pure hypercholesterolemia Cardiovascular risk and specific lipid/LDL goals reviewed.  We discussed several lifestyle modifications today and Stephanie French will continue to work on diet, exercise and weight loss efforts. Stephanie French will continue her medications. Orders and follow up as documented in patient record.   Counseling Intensive lifestyle modifications are the first line treatment for this issue. Dietary changes: Increase soluble fiber. Decrease simple carbohydrates. Exercise changes: Moderate to vigorous-intensity aerobic activity 150 minutes per week if tolerated. Lipid-lowering medications: see documented in medical record.   3. Obesity BMI  today is 38 [name] is currently in the action stage of change. As such, her goal is to continue with weight loss efforts. She has agreed to the Category 1 Plan.   Stephanie French will continue meal planning and she will adhere closely to the plan. Strategies for the holidays were provided. She will keep water and protein high. We discussed labs from 10/13/2021 CMP, CBC ans A1C today.  Exercise goals: No exercise has been prescribed at this time.  Behavioral modification strategies: increasing lean protein intake, decreasing simple carbohydrates, increasing vegetables, increasing water intake, decreasing eating out, no skipping meals, meal planning and cooking strategies, keeping healthy foods in the home, and planning for success.  Stephanie French has agreed to follow-up with our clinic in 2 weeks. She was informed of the importance of frequent follow-up visits to maximize her success with intensive lifestyle modifications for her multiple health conditions.   Objective:   Blood pressure 135/74, pulse 68, temperature (!) 97.5 F (36.4 C), height 5\' 4"  (1.626 m), weight 204 lb (92.5 kg), last menstrual period 08/12/2012, SpO2 98 %. Body mass index is 35.02 kg/m.  General: Cooperative, alert, well developed, in no acute distress. HEENT: Conjunctivae and lids unremarkable. Cardiovascular: Regular rhythm.  Lungs: Normal work of breathing. Neurologic: No focal deficits.   Lab Results  Component Value Date   CREATININE 0.91 10/13/2021   BUN 14 10/13/2021   NA 142 10/13/2021   K 4.6 10/13/2021   CL 105 10/13/2021   CO2 22 10/13/2021   Lab Results  Component Value Date   ALT 16 10/13/2021   AST 22 10/13/2021   ALKPHOS 81 10/13/2021   BILITOT 0.3 10/13/2021   Lab Results  Component Value Date  HGBA1C 5.8 (H) 10/13/2021   HGBA1C 6.0 (H) 06/11/2021   HGBA1C 6.1 (H) 02/10/2021   HGBA1C 6.3 (H) 10/07/2020   HGBA1C 6.0 (H) 06/20/2020   Lab Results  Component Value Date   INSULIN 15.4 09/04/2021    Lab Results  Component Value Date   TSH 2.200 09/04/2021   Lab Results  Component Value Date   CHOL 203 (H) 09/04/2021   HDL 86 09/04/2021   LDLCALC 108 (H) 09/04/2021   TRIG 51 09/04/2021   CHOLHDL 2.0 10/07/2020   Lab Results  Component Value Date   VD25OH 50.6 09/04/2021   VD25OH 21.9 (L) 10/07/2020   VD25OH 17.8 (L) 02/19/2020   Lab Results  Component Value Date   WBC 4.9 10/13/2021   HGB 12.3 10/13/2021   HCT 37.1 10/13/2021   MCV 68 (L) 10/13/2021   PLT 202 10/13/2021   No results found for: IRON, TIBC, FERRITIN  Attestation Statements:   Reviewed by clinician on day of visit: allergies, medications, problem list, medical history, surgical history, family history, social history, and previous encounter notes.  I, Lizbeth Bark, RMA, am acting as Location manager for [employer], DO.   I have reviewed the above documentation for accuracy and completeness, and I agree with the above. Jearld Lesch, DO

## 2021-11-18 ENCOUNTER — Other Ambulatory Visit: Payer: Self-pay | Admitting: Internal Medicine

## 2021-11-18 DIAGNOSIS — Z1231 Encounter for screening mammogram for malignant neoplasm of breast: Secondary | ICD-10-CM

## 2021-11-19 ENCOUNTER — Ambulatory Visit
Admission: RE | Admit: 2021-11-19 | Discharge: 2021-11-19 | Disposition: A | Discharge status: Home / Self Care | Payer: BC Managed Care – PPO | Source: Ambulatory Visit | Attending: Internal Medicine | Admitting: Internal Medicine

## 2021-11-19 ENCOUNTER — Other Ambulatory Visit: Payer: Self-pay

## 2021-11-19 DIAGNOSIS — Z1231 Encounter for screening mammogram for malignant neoplasm of breast: Secondary | ICD-10-CM

## 2021-11-21 ENCOUNTER — Ambulatory Visit (HOSPITAL_COMMUNITY)
Admission: RE | Admit: 2021-11-21 | Discharge: 2021-11-21 | Disposition: A | Discharge status: Home / Self Care | Payer: BC Managed Care – PPO | Source: Ambulatory Visit | Attending: Physician Assistant | Admitting: Physician Assistant

## 2021-11-21 ENCOUNTER — Ambulatory Visit: Payer: BC Managed Care – PPO | Admitting: Physician Assistant

## 2021-11-21 ENCOUNTER — Other Ambulatory Visit: Payer: Self-pay

## 2021-11-21 VITALS — BP 112/72 | HR 66 | Temp 97.2°F | Resp 16 | Ht 64.0 in | Wt 204.0 lb

## 2021-11-21 DIAGNOSIS — I8390 Asymptomatic varicose veins of unspecified lower extremity: Secondary | ICD-10-CM | POA: Insufficient documentation

## 2021-11-21 DIAGNOSIS — I8391 Asymptomatic varicose veins of right lower extremity: Secondary | ICD-10-CM | POA: Diagnosis not present

## 2021-11-21 NOTE — Progress Notes (Signed)
[facility] [place]   Reason for referral: Swollen right > left leg  History of Present Illness  Stephanie French is a 65 y.o. female who presents with chief complaint: swollen leg.  Patient notes, onset of swelling before 2011 months ago, associated with prolonged sitting and standing.  The patient has had no history of DVT, + history of varicose vein, no history of venous stasis ulcers, no history of  Lymphedema and + history of skin changes in lower legs.  There is unknown family history of venous disorders.  The patient has  used compression stockings in the past.  She was seen by a vein specialist in 2011 and had treatments to include sclero therapy from the knee down with some success.  She is working toward weight reduction and a healthy lifestyle.  She is having good success.    Past Medical History:  Diagnosis Date   Anemia    Anxiety    Atypical chest pain 08/31/2019   Back pain    Constipation    Depression    Diabetes mellitus    Edema of both lower extremities    GERD (gastroesophageal reflux disease)    Hyperlipidemia    Joint pain    Obesity    Pure hypercholesterolemia 12/07/2019   Shortness of breath 08/31/2019   Vitamin D deficiency     Past Surgical History:  Procedure Laterality Date   OOPHORECTOMY Left 1983   left ovary removed    Social History   Socioeconomic History   Marital status: Divorced    Spouse name: Not on file   Number of children: Not on file   Years of education: Not on file   Highest education level: Not on file  Occupational History   Occupation: Freight forwarder  Tobacco Use   Smoking status: Former    Packs/day: 0.10    Years: 40.00    Pack years: 4.00    Types: Cigarettes   Smokeless tobacco: Never  Vaping Use   Vaping Use: Never used  Substance and Sexual Activity   Alcohol use: No   Drug use: No   Sexual activity: Not Currently    Birth control/protection: None  Other Topics Concern   Not on file   Social History Narrative   Not on file   Social Determinants of Health   Financial Resource Strain: Not on file  Food Insecurity: Not on file  Transportation Needs: Not on file  Physical Activity: Not on file  Stress: Not on file  Social Connections: Not on file  Intimate Partner Violence: Not on file    Family History  Problem Relation Age of Onset   Diabetes Mother    Hypertension Mother    Stroke Mother    Heart attack Mother    High Cholesterol Mother    Early death Father    Lung cancer Father    Diabetes Sister    Hypertension Sister    Breast cancer Maternal Grandmother    Heart attack Maternal Grandmother    Cancer Other    Hypertension Other    Hyperlipidemia Other    Stroke Other    Heart attack Other     Current Outpatient Medications on File Prior to Visit  Medication Sig Dispense Refill   hydrocortisone (ANUSOL-HC) 2.5 % rectal cream Place 1 application rectally 2 (two) times daily as needed for hemorrhoids or anal itching. 30 g 0   Multiple Vitamin (MULTIVITAMIN) capsule Take 1 capsule by mouth daily.  For 50 plus     pravastatin (PRAVACHOL) 40 MG tablet Take 1 tablet (40 mg total) by mouth daily. 90 tablet 1   Semaglutide-Weight Management (WEGOVY) 2.4 MG/0.75ML SOAJ Inject 2.4 mg into the skin once a week. 9 mL 3   No current facility-administered medications on file prior to visit.    Allergies as of 11/21/2021 - Review Complete 11/21/2021  Allergen Reaction Noted   Eggs or egg-derived products  05/19/2012   Latex  02/25/2019   Other Itching 12/04/2019   Penicillins  05/19/2012   Shellfish allergy Hives, Itching, Photosensitivity, and Rash 12/31/2020   Shiitake mushroom Hives, Itching, Photosensitivity, and Rash 12/31/2020     ROS:   General:  No weight loss, Fever, chills  HEENT: No recent headaches, no nasal bleeding, no visual changes, no sore throat  Neurologic: No dizziness, blackouts, seizures. No recent symptoms of stroke or mini-  stroke. No recent episodes of slurred speech, or temporary blindness.  Cardiac: No recent episodes of chest pain/pressure, no shortness of breath at rest.  No shortness of breath with exertion.  Denies history of atrial fibrillation or irregular heartbeat  Vascular: No history of rest pain in feet.  No history of claudication.  No history of non-healing ulcer, No history of DVT   Pulmonary: No home oxygen, no productive cough, no hemoptysis,  No asthma or wheezing  Musculoskeletal:  [ ]  Arthritis, [ ]  Low back pain,  [ ]  Joint pain  Hematologic:No history of hypercoagulable state.  No history of easy bleeding.  No history of anemia  Gastrointestinal: No hematochezia or melena,  No gastroesophageal reflux, no trouble swallowing  Urinary: [ ]  chronic Kidney disease, [ ]  on HD - [ ]  MWF or [ ]  TTHS, [ ]  Burning with urination, [ ]  Frequent urination, [ ]  Difficulty urinating;   Skin: No rashes  Psychological: No history of anxiety,  No history of depression  Physical Examination  Vitals:   11/21/21 1326  BP: 112/72  Pulse: 66  Resp: 16  Temp: (!) 97.2 F (36.2 C)  TempSrc: Temporal  SpO2: 99%  Weight: 204 lb (92.5 kg)  Height: 5\' 4"  (1.626 m)    Body mass index is 35.02 kg/m.  General:  Alert and oriented, no acute distress HEENT: Normal Neck: No bruit or JVD Pulmonary: Clear to auscultation bilaterally Cardiac: Regular Rate and Rhythm without murmur Abdomen: Soft, non-tender, non-distended, no mass, no scars Skin: No rash Extremity Pulses:  2+ radial, brachial, femoral, dorsalis pedis, posterior tibial pulses bilaterally Musculoskeletal: No deformity or edema  Neurologic: Upper and lower extremity motor 5/5 and symmetric  DATA: Venous Reflux Times  +------------------+---------+------+-----------+------------+-------------  +  RIGHT             Reflux NoRefluxReflux TimeDiameter cmsComments                                    Yes                                          +------------------+---------+------+-----------+------------+-------------  +  CFV                         yes   >1 second                             +------------------+---------+------+-----------+------------+-------------  +  FV mid            no                                                    +------------------+---------+------+-----------+------------+-------------  +  GSV at [facility]        no                            0.63                    +------------------+---------+------+-----------+------------+-------------  +  GSV prox thigh              yes    >500 ms      0.38                    +------------------+---------+------+-----------+------------+-------------  +  GSV mid thigh               yes    >500 ms      0.37    large branch    +------------------+---------+------+-----------+------------+-------------  +  GSV dist thigh                                  0.20    out of  fascia  +------------------+---------+------+-----------+------------+-------------  +  GSV at knee                                             NWV              +------------------+---------+------+-----------+------------+-------------  +  GSV prox calf                                           NWV              +------------------+---------+------+-----------+------------+-------------  +  SSV Pop Fossa     no                            0.15                    +------------------+---------+------+-----------+------------+-------------  +  anterior accessoryno                            0.45                    +------------------+---------+------+-----------+------------+-------------  +   Summary:  Right:  - No evidence of deep vein thrombosis from the common femoral through the  popliteal veins.  - No evidence of superficial venous thrombosis.  - The common femoral vein is not competent.  -  The great saphenous vein is not competent.  - The small saphenous vein is competent.     Assessment/Plan: Venous reflux  - The common femoral vein is not competent.  - The great saphenous vein is not competent.  She has palpable pedal pulses and  is not at risk of limb loss Her reflux is stable without significant edema or venous ulcers.  The reflux is not amenable to intervention due to vein size and location of venous reflux.  Continue compression, exercise and elevation as needed.  F/U PRN.    Roxy Horseman PA-C [facility] [place] Office: [phone]  MD on call Scot Dock

## 2021-11-28 ENCOUNTER — Encounter: Payer: Self-pay | Admitting: Physician Assistant

## 2021-12-08 ENCOUNTER — Ambulatory Visit (INDEPENDENT_AMBULATORY_CARE_PROVIDER_SITE_OTHER): Payer: BC Managed Care – PPO | Admitting: Bariatrics

## 2021-12-08 ENCOUNTER — Other Ambulatory Visit: Payer: Self-pay

## 2021-12-08 ENCOUNTER — Encounter (INDEPENDENT_AMBULATORY_CARE_PROVIDER_SITE_OTHER): Payer: Self-pay | Admitting: Bariatrics

## 2021-12-08 VITALS — BP 114/71 | HR 71 | Temp 97.9°F | Ht 64.0 in | Wt 206.0 lb

## 2021-12-08 DIAGNOSIS — Z6836 Body mass index (BMI) 36.0-36.9, adult: Secondary | ICD-10-CM

## 2021-12-08 DIAGNOSIS — E7800 Pure hypercholesterolemia, unspecified: Secondary | ICD-10-CM

## 2021-12-08 DIAGNOSIS — E1169 Type 2 diabetes mellitus with other specified complication: Secondary | ICD-10-CM | POA: Diagnosis not present

## 2021-12-08 DIAGNOSIS — E669 Obesity, unspecified: Secondary | ICD-10-CM | POA: Diagnosis not present

## 2021-12-09 NOTE — Progress Notes (Signed)
Chief Complaint:   OBESITY Stephanie French is here to discuss her progress with her obesity treatment plan along with follow-up of her obesity related diagnoses. Stephanie French is on the Category 1 Plan and states she is following her eating plan approximately 0% of the time. Stephanie French states she is walking  for 45-60 minutes and doing red resistance bands for 30 minutes 5 times per week.  Today's visit was #: 7 Starting weight: 213 lbs Starting date: 09/03/2021 Today's weight: 206 lbs Today's date: 12/08/2021 Total lbs lost to date: 7 lbs Total lbs lost since last in-office visit: 0  Interim History: Stephanie French is up 2 lbs since her last visit.   Subjective:   1. Pure hypercholesterolemia Stephanie French is currently taking Pravachol.   2. Diabetes mellitus type 2 in obese [facility] Stephanie French is taking [place] currently. She denies any issues or side effects with her medication.  Assessment/Plan:   1. Pure hypercholesterolemia Cardiovascular risk and specific lipid/LDL goals reviewed.  We discussed several lifestyle modifications today and Stephanie French will continue to work on diet, exercise and weight loss efforts. Stephanie French will continue her medications. Orders and follow up as documented in patient record.   Counseling Intensive lifestyle modifications are the first line treatment for this issue. Dietary changes: Increase soluble fiber. Decrease simple carbohydrates. Exercise changes: Moderate to vigorous-intensity aerobic activity 150 minutes per week if tolerated. Lipid-lowering medications: see documented in medical record.  2. Diabetes mellitus type 2 in obese [facility]) Stephanie French will continue taking Wegovy. Good blood sugar control is important to decrease the likelihood of diabetic complications such as nephropathy, neuropathy, limb loss, blindness, coronary artery disease, and death. Intensive lifestyle modification including diet, exercise and weight loss are the first line of treatment for diabetes.   3. Obesity, current BMI  35.4 Stephanie French is currently in the action stage of change. As such, her goal is to continue with weight loss efforts. She has agreed to the Category 1 Plan.   Stephanie French will continue meal planning (handouts given). She will adhere closely to the plan. Recipes were provided today.  Exercise goals:  Information for [employer] was provided today.  Behavioral modification strategies: increasing lean protein intake, decreasing simple carbohydrates, increasing vegetables, increasing water intake, decreasing eating out, no skipping meals, meal planning and cooking strategies, keeping healthy foods in the home, ways to avoid boredom eating, and planning for success.  Stephanie French has agreed to follow-up with our clinic in 2-3 weeks. She was informed of the importance of frequent follow-up visits to maximize her success with intensive lifestyle modifications for her multiple health conditions.   Objective:   Blood pressure 114/71, pulse 71, temperature 97.9 F (36.6 C), height 5\' 4"  (1.626 m), weight 206 lb (93.4 kg), last menstrual period 08/12/2012, SpO2 100 %. Body mass index is 35.36 kg/m.  General: Cooperative, alert, well developed, in no acute distress. HEENT: Conjunctivae and lids unremarkable. Cardiovascular: Regular rhythm.  Lungs: Normal work of breathing. Neurologic: No focal deficits.   Lab Results  Component Value Date   CREATININE 0.91 10/13/2021   BUN 14 10/13/2021   NA 142 10/13/2021   K 4.6 10/13/2021   CL 105 10/13/2021   CO2 22 10/13/2021   Lab Results  Component Value Date   ALT 16 10/13/2021   AST 22 10/13/2021   ALKPHOS 81 10/13/2021   BILITOT 0.3 10/13/2021   Lab Results  Component Value Date   HGBA1C 5.8 (H) 10/13/2021   HGBA1C 6.0 (H) 06/11/2021   HGBA1C 6.1 (H)  02/10/2021   HGBA1C 6.3 (H) 10/07/2020   HGBA1C 6.0 (H) 06/20/2020   Lab Results  Component Value Date   INSULIN 15.4 09/04/2021   Lab Results  Component Value Date   TSH 2.200 09/04/2021   Lab Results   Component Value Date   CHOL 203 (H) 09/04/2021   HDL 86 09/04/2021   LDLCALC 108 (H) 09/04/2021   TRIG 51 09/04/2021   CHOLHDL 2.0 10/07/2020   Lab Results  Component Value Date   VD25OH 50.6 09/04/2021   VD25OH 21.9 (L) 10/07/2020   VD25OH 17.8 (L) 02/19/2020   Lab Results  Component Value Date   WBC 4.9 10/13/2021   HGB 12.3 10/13/2021   HCT 37.1 10/13/2021   MCV 68 (L) 10/13/2021   PLT 202 10/13/2021   No results found for: IRON, TIBC, FERRITIN  Attestation Statements:   Reviewed by clinician on day of visit: allergies, medications, problem list, medical history, surgical history, family history, social history, and previous encounter notes.  I, Lizbeth Bark, RMA, am acting as Location manager for [employer], DO.  I have reviewed the above documentation for accuracy and completeness, and I agree with the above. Jearld Lesch, DO

## 2021-12-10 ENCOUNTER — Encounter (INDEPENDENT_AMBULATORY_CARE_PROVIDER_SITE_OTHER): Payer: Self-pay | Admitting: Bariatrics

## 2021-12-15 LAB — HM DIABETES EYE EXAM

## 2021-12-23 ENCOUNTER — Encounter: Payer: Self-pay | Admitting: Internal Medicine

## 2021-12-26 ENCOUNTER — Encounter: Payer: Self-pay | Admitting: Internal Medicine

## 2021-12-29 ENCOUNTER — Other Ambulatory Visit: Payer: Self-pay

## 2021-12-29 MED ORDER — WEGOVY 2.4 MG/0.75ML ~~LOC~~ SOAJ: 2.4000 mg | SUBCUTANEOUS | 3 refills | Status: DC

## 2021-12-31 ENCOUNTER — Ambulatory Visit (INDEPENDENT_AMBULATORY_CARE_PROVIDER_SITE_OTHER): Payer: BC Managed Care – PPO | Admitting: Bariatrics

## 2022-01-12 ENCOUNTER — Ambulatory Visit (INDEPENDENT_AMBULATORY_CARE_PROVIDER_SITE_OTHER): Payer: Self-pay | Admitting: Bariatrics

## 2022-01-19 ENCOUNTER — Encounter (INDEPENDENT_AMBULATORY_CARE_PROVIDER_SITE_OTHER): Payer: Self-pay

## 2022-01-20 ENCOUNTER — Encounter (INDEPENDENT_AMBULATORY_CARE_PROVIDER_SITE_OTHER): Payer: Self-pay | Admitting: Bariatrics

## 2022-01-20 ENCOUNTER — Other Ambulatory Visit: Payer: Self-pay

## 2022-01-20 ENCOUNTER — Ambulatory Visit (INDEPENDENT_AMBULATORY_CARE_PROVIDER_SITE_OTHER): Payer: 59 | Admitting: Bariatrics

## 2022-01-20 VITALS — BP 126/84 | HR 68 | Temp 98.0°F | Ht 64.0 in | Wt 205.0 lb

## 2022-01-20 DIAGNOSIS — E1169 Type 2 diabetes mellitus with other specified complication: Secondary | ICD-10-CM

## 2022-01-20 DIAGNOSIS — E7800 Pure hypercholesterolemia, unspecified: Secondary | ICD-10-CM | POA: Diagnosis not present

## 2022-01-20 DIAGNOSIS — K5909 Other constipation: Secondary | ICD-10-CM | POA: Diagnosis not present

## 2022-01-20 DIAGNOSIS — Z6835 Body mass index (BMI) 35.0-35.9, adult: Secondary | ICD-10-CM

## 2022-01-20 DIAGNOSIS — Z7984 Long term (current) use of oral hypoglycemic drugs: Secondary | ICD-10-CM

## 2022-01-20 DIAGNOSIS — E669 Obesity, unspecified: Secondary | ICD-10-CM

## 2022-01-21 ENCOUNTER — Encounter (INDEPENDENT_AMBULATORY_CARE_PROVIDER_SITE_OTHER): Payer: Self-pay | Admitting: Bariatrics

## 2022-01-21 NOTE — Progress Notes (Signed)
Chief Complaint:   OBESITY Stephanie French is here to discuss her progress with her obesity treatment plan along with follow-up of her obesity related diagnoses. Stephanie French is on the Category 1 Plan and states she is following her eating plan approximately 60% of the time. Stephanie French states she is using strengthen bands for 10 minutes 3 times per week.  Today's visit was #: 8 Starting weight: 213 lbs Starting date: 09/03/2021 Today's weight: 205 lbs Today's date: 01/20/2022 Total lbs lost to date: 8 lbs Total lbs lost since last in-office visit: 1 lb  Interim History: Stephanie French is down 1 lb since her last visit. She is taking Wegovy.  Subjective:   1. Other constipation Brittain is not taking fiber or no other medications.   2. Pure hypercholesterolemia Stephanie French is taking Pravachol (not taking on a regular basis).   3. Diabetes mellitus type 2 in obese [facility]) Stephanie French is currently taking Wegovy.  Assessment/Plan:   1. Other constipation Jaidynn will start taking Citrucel daily or Miralax daily. She will increase her water intake. She was informed that a decrease in bowel movement frequency is normal while losing weight, but stools should not be hard or painful. Orders and follow up as documented in patient record.   Counseling Getting to Good Bowel Health: Your goal is to have one soft bowel movement each day. Drink at least 8 glasses of water each day. Eat plenty of fiber (goal is over 25 grams each day). It is best to get most of your fiber from dietary sources which includes leafy green vegetables, fresh fruit, and whole grains. You may need to add fiber with the help of OTC fiber supplements. These include Metamucil, Citrucel, and Flaxseed. If you are still having trouble, try adding Miralax or Magnesium Citrate. If all of these changes do not work, Cabin [employer].   2. Pure hypercholesterolemia Stephanie French will talk with her primary care physician. Cardiovascular risk and specific lipid/LDL goals  reviewed.  We discussed several lifestyle modifications today and Stephanie French will continue to work on diet, exercise and weight loss efforts. Orders and follow up as documented in patient record.   Counseling Intensive lifestyle modifications are the first line treatment for this issue. Dietary changes: Increase soluble fiber. Decrease simple carbohydrates. Exercise changes: Moderate to vigorous-intensity aerobic activity 150 minutes per week if tolerated. Lipid-lowering medications: see documented in medical record.  3. Diabetes mellitus type 2 in obese [facility]) Stephanie French will continue taking Wegovy. Good blood sugar control is important to decrease the likelihood of diabetic complications such as nephropathy, neuropathy, limb loss, blindness, coronary artery disease, and death. Intensive lifestyle modification including diet, exercise and weight loss are the first line of treatment for diabetes.   4. Obesity, current BMI 35.2 Stephanie French is currently in the action stage of change. As such, her goal is to continue with weight loss efforts. She has agreed to the Category 1 Plan and keeping a food journal and adhering to recommended goals of 1000-1100 calories and 80 grams of protein.   Stephanie French will continue meal planning and she will continue intentional eating. She will increase protein. She will continue taking Wegovy.  Exercise goals:  As is. Stephanie French will walk at work 10,000 steps.  Behavioral modification strategies: increasing lean protein intake, decreasing simple carbohydrates, increasing vegetables, increasing water intake, decreasing eating out, no skipping meals, meal planning and cooking strategies, keeping healthy foods in the home, and planning for success.  Stephanie French has agreed to follow-up with our clinic in  3 weeks. She was informed of the importance of frequent follow-up visits to maximize her success with intensive lifestyle modifications for her multiple health conditions.   Objective:   Blood  pressure 126/84, pulse 68, temperature 98 F (36.7 C), height 5\' 4"  (1.626 m), weight 205 lb (93 kg), last menstrual period 08/12/2012, SpO2 100 %. Body mass index is 35.19 kg/m.  General: Cooperative, alert, well developed, in no acute distress. HEENT: Conjunctivae and lids unremarkable. Cardiovascular: Regular rhythm.  Lungs: Normal work of breathing. Neurologic: No focal deficits.   Lab Results  Component Value Date   CREATININE 0.91 10/13/2021   BUN 14 10/13/2021   NA 142 10/13/2021   K 4.6 10/13/2021   CL 105 10/13/2021   CO2 22 10/13/2021   Lab Results  Component Value Date   ALT 16 10/13/2021   AST 22 10/13/2021   ALKPHOS 81 10/13/2021   BILITOT 0.3 10/13/2021   Lab Results  Component Value Date   HGBA1C 5.8 (H) 10/13/2021   HGBA1C 6.0 (H) 06/11/2021   HGBA1C 6.1 (H) 02/10/2021   HGBA1C 6.3 (H) 10/07/2020   HGBA1C 6.0 (H) 06/20/2020   Lab Results  Component Value Date   INSULIN 15.4 09/04/2021   Lab Results  Component Value Date   TSH 2.200 09/04/2021   Lab Results  Component Value Date   CHOL 203 (H) 09/04/2021   HDL 86 09/04/2021   LDLCALC 108 (H) 09/04/2021   TRIG 51 09/04/2021   CHOLHDL 2.0 10/07/2020   Lab Results  Component Value Date   VD25OH 50.6 09/04/2021   VD25OH 21.9 (L) 10/07/2020   VD25OH 17.8 (L) 02/19/2020   Lab Results  Component Value Date   WBC 4.9 10/13/2021   HGB 12.3 10/13/2021   HCT 37.1 10/13/2021   MCV 68 (L) 10/13/2021   PLT 202 10/13/2021   No results found for: IRON, TIBC, FERRITIN  Attestation Statements:   Reviewed by clinician on day of visit: allergies, medications, problem list, medical history, surgical history, family history, social history, and previous encounter notes.  I, Lizbeth Bark, RMA, am acting as Location manager for [employer], DO.  I have reviewed the above documentation for accuracy and completeness, and I agree with the above. Jearld Lesch, DO

## 2022-02-10 ENCOUNTER — Other Ambulatory Visit: Payer: Self-pay

## 2022-02-10 ENCOUNTER — Ambulatory Visit (INDEPENDENT_AMBULATORY_CARE_PROVIDER_SITE_OTHER): Payer: 59 | Admitting: Bariatrics

## 2022-02-10 ENCOUNTER — Encounter (INDEPENDENT_AMBULATORY_CARE_PROVIDER_SITE_OTHER): Payer: Self-pay | Admitting: Bariatrics

## 2022-02-10 VITALS — BP 123/75 | HR 80 | Temp 98.1°F | Ht 64.0 in | Wt 204.0 lb

## 2022-02-10 DIAGNOSIS — E1169 Type 2 diabetes mellitus with other specified complication: Secondary | ICD-10-CM | POA: Diagnosis not present

## 2022-02-10 DIAGNOSIS — Z6836 Body mass index (BMI) 36.0-36.9, adult: Secondary | ICD-10-CM

## 2022-02-10 DIAGNOSIS — E669 Obesity, unspecified: Secondary | ICD-10-CM

## 2022-02-10 DIAGNOSIS — E7800 Pure hypercholesterolemia, unspecified: Secondary | ICD-10-CM | POA: Diagnosis not present

## 2022-02-10 DIAGNOSIS — Z6835 Body mass index (BMI) 35.0-35.9, adult: Secondary | ICD-10-CM

## 2022-02-10 DIAGNOSIS — E559 Vitamin D deficiency, unspecified: Secondary | ICD-10-CM

## 2022-02-10 DIAGNOSIS — Z7985 Long-term (current) use of injectable non-insulin antidiabetic drugs: Secondary | ICD-10-CM

## 2022-02-10 NOTE — Progress Notes (Signed)
Chief Complaint:   OBESITY Stephanie French is here to discuss her progress with her obesity treatment plan along with follow-up of her obesity related diagnoses. Stephanie French is on the Category 1 Plan or keeping a food journal and adhering to recommended goals of 1000-1100 calories and 80 grams of protein daily and states she is following her eating plan approximately 50% of the time. Stephanie French states she is walking 10,000 steps 5 times per week.  Today's visit was #: 9 Starting weight: 213 lbs Starting date: 09/03/2021 Today's weight: 204 lbs Today's date: 02/10/2022 Total lbs lost to date: 9 Total lbs lost since last in-office visit: 1  Interim History: Stephanie French is down 1 lbs since her last visit. She has hit a plateau at this time.   Subjective:   1. Pure hypercholesterolemia Stephanie French is taking Pravachol.    2. Diabetes mellitus type 2 in obese Stephanie French is taking [place].  3. Vitamin D deficiency Stephanie French is not on Vitamin D.  Assessment/Plan:   1. Pure hypercholesterolemia Cardiovascular risk and specific lipid/LDL goals reviewed. We discussed several lifestyle modifications today. Stephanie French will continue medications, continue to work on diet, exercise and weight loss efforts. Orders and follow up as documented in patient record.   Counseling Intensive lifestyle modifications are the first line treatment for this issue. Dietary changes: Increase soluble fiber. Decrease simple carbohydrates. Exercise changes: Moderate to vigorous-intensity aerobic activity 150 minutes per week if tolerated. Lipid-lowering medications: see documented in medical record.  2. Diabetes mellitus type 2 in obese [name] will continue [phone]. Good blood sugar control is important to decrease the likelihood of diabetic complications such as nephropathy, neuropathy, limb loss, blindness, coronary artery disease, and death. Intensive lifestyle modification including diet, exercise and weight loss are the first line of  treatment for diabetes.   3. Vitamin D deficiency Low Vitamin D level contributes to fatigue and are associated with obesity, breast, and colon cancer. Stephanie French agreed to start OTC Vitamin D 2,000 IU and will follow-up for routine testing of Vitamin D, at least 2-3 times per year to avoid over-replacement.  4. Obesity, current BMI 35.2 Stephanie French is currently in the action stage of change. As such, her goal is to continue with weight loss efforts. She has agreed to the Category 1 Plan or keeping a food journal and adhering to recommended goals of 1000-1100 calories and 80 grams of protein daily.   Meal planning. Will adhere closely to the plan 80-90%. Will eat all 3 meals. Increase water.  Exercise goals: As is.  Behavioral modification strategies: increasing lean protein intake, decreasing simple carbohydrates, increasing vegetables, increasing water intake, decreasing eating out, no skipping meals, meal planning and cooking strategies, keeping healthy foods in the home, and planning for success.  Stephanie French has agreed to follow-up with our clinic in 2 weeks. She was informed of the importance of frequent follow-up visits to maximize her success with intensive lifestyle modifications for her multiple health conditions.   Objective:   Blood pressure 123/75, pulse 80, temperature 98.1 F (36.7 C), height 5\' 4"  (1.626 m), weight 204 lb (92.5 kg), last menstrual period 08/12/2012, SpO2 99 %. Body mass index is 35.02 kg/m.  General: Cooperative, alert, well developed, in no acute distress. HEENT: Conjunctivae and lids unremarkable. Cardiovascular: Regular rhythm.  Lungs: Normal work of breathing. Neurologic: No focal deficits.   Lab Results  Component Value Date   CREATININE 0.91 10/13/2021   BUN 14 10/13/2021   NA 142 10/13/2021   K  4.6 10/13/2021   CL 105 10/13/2021   CO2 22 10/13/2021   Lab Results  Component Value Date   ALT 16 10/13/2021   AST 22 10/13/2021   ALKPHOS 81 10/13/2021    BILITOT 0.3 10/13/2021   Lab Results  Component Value Date   HGBA1C 5.8 (H) 10/13/2021   HGBA1C 6.0 (H) 06/11/2021   HGBA1C 6.1 (H) 02/10/2021   HGBA1C 6.3 (H) 10/07/2020   HGBA1C 6.0 (H) 06/20/2020   Lab Results  Component Value Date   INSULIN 15.4 09/04/2021   Lab Results  Component Value Date   TSH 2.200 09/04/2021   Lab Results  Component Value Date   CHOL 203 (H) 09/04/2021   HDL 86 09/04/2021   LDLCALC 108 (H) 09/04/2021   TRIG 51 09/04/2021   CHOLHDL 2.0 10/07/2020   Lab Results  Component Value Date   VD25OH 50.6 09/04/2021   VD25OH 21.9 (L) 10/07/2020   VD25OH 17.8 (L) 02/19/2020   Lab Results  Component Value Date   WBC 4.9 10/13/2021   HGB 12.3 10/13/2021   HCT 37.1 10/13/2021   MCV 68 (L) 10/13/2021   PLT 202 10/13/2021   No results found for: IRON, TIBC, FERRITIN  Attestation Statements:   Reviewed by clinician on day of visit: allergies, medications, problem list, medical history, surgical history, family history, social history, and previous encounter notes.   Wilhemena Durie, am acting as Location manager for [employer], DO.  I have reviewed the above documentation for accuracy and completeness, and I agree with the above. Jearld Lesch, DO

## 2022-02-11 ENCOUNTER — Encounter (INDEPENDENT_AMBULATORY_CARE_PROVIDER_SITE_OTHER): Payer: Self-pay | Admitting: Bariatrics

## 2022-02-16 ENCOUNTER — Ambulatory Visit (INDEPENDENT_AMBULATORY_CARE_PROVIDER_SITE_OTHER): Payer: 59 | Admitting: Internal Medicine

## 2022-02-16 ENCOUNTER — Other Ambulatory Visit: Payer: Self-pay

## 2022-02-16 ENCOUNTER — Encounter: Payer: Self-pay | Admitting: Internal Medicine

## 2022-02-16 VITALS — BP 126/70 | HR 61 | Temp 97.9°F | Ht 64.0 in | Wt 206.6 lb

## 2022-02-16 DIAGNOSIS — Z23 Encounter for immunization: Secondary | ICD-10-CM

## 2022-02-16 DIAGNOSIS — E1169 Type 2 diabetes mellitus with other specified complication: Secondary | ICD-10-CM

## 2022-02-16 DIAGNOSIS — E7800 Pure hypercholesterolemia, unspecified: Secondary | ICD-10-CM

## 2022-02-16 DIAGNOSIS — R6889 Other general symptoms and signs: Secondary | ICD-10-CM | POA: Diagnosis not present

## 2022-02-16 DIAGNOSIS — Z6835 Body mass index (BMI) 35.0-35.9, adult: Secondary | ICD-10-CM

## 2022-02-16 MED ORDER — WEGOVY 2.4 MG/0.75ML ~~LOC~~ SOAJ: 2.4000 mg | SUBCUTANEOUS | 3 refills | Status: DC

## 2022-02-16 NOTE — Progress Notes (Signed)
Rich Brave [place],acting as a Education administrator for Maximino Greenland, MD.,have documented all relevant documentation on the behalf of Maximino Greenland, MD,as directed by  Maximino Greenland, MD while in the presence of Maximino Greenland, MD.  This visit occurred during the SARS-CoV-2 public health emergency.  Safety protocols were in place, including screening questions prior to the visit, additional usage of staff PPE, and extensive cleaning of exam room while observing appropriate contact time as indicated for disinfecting solutions.  Subjective:     Patient ID: Stephanie French , female    DOB: Feb 11, 1956 , 66 y.o.   MRN: [id number]   Chief Complaint  Patient presents with   Diabetes   Hyperlipidemia    HPI  She presents today for chol and DM check. She reports compliance with meds. She denies headaches, chest pain and shortness of breath. States she has been exercising 3 days per week.   Diabetes She presents for her follow-up diabetic visit. She has type 2 diabetes mellitus. There are no hypoglycemic associated symptoms. Pertinent negatives for diabetes include no blurred vision and no chest pain. There are no hypoglycemic complications. Symptoms are stable. Risk factors for coronary artery disease include diabetes mellitus, obesity and post-menopausal. Her breakfast blood glucose is taken between 8-9 am. Her breakfast blood glucose range is generally 90-110 mg/dl. An ACE inhibitor/angiotensin II receptor blocker is not being taken.    Past Medical History:  Diagnosis Date   Anemia    Anxiety    Atypical chest pain 08/31/2019   Back pain    Constipation    Depression    Diabetes mellitus    Edema of both lower extremities    GERD (gastroesophageal reflux disease)    Hyperlipidemia    Joint pain    Obesity    Pure hypercholesterolemia 12/07/2019   Shortness of breath 08/31/2019   Vitamin D deficiency      Family History  Problem Relation Age of Onset   Diabetes Mother    Hypertension  Mother    Stroke Mother    Heart attack Mother    High Cholesterol Mother    Early death Father    Lung cancer Father    Diabetes Sister    Hypertension Sister    Breast cancer Maternal Grandmother    Heart attack Maternal Grandmother    Cancer Other    Hypertension Other    Hyperlipidemia Other    Stroke Other    Heart attack Other      Current Outpatient Medications:    hydrocortisone (ANUSOL-HC) 2.5 % rectal cream, Place 1 application rectally 2 (two) times daily as needed for hemorrhoids or anal itching., Disp: 30 g, Rfl: 0   Multiple Vitamin (MULTIVITAMIN) capsule, Take 1 capsule by mouth daily. For 50 plus, Disp: , Rfl:    pravastatin (PRAVACHOL) 40 MG tablet, Take 1 tablet (40 mg total) by mouth daily., Disp: 90 tablet, Rfl: 1   Semaglutide-Weight Management (WEGOVY) 2.4 MG/0.75ML SOAJ, Inject 2.4 mg into the skin once a week., Disp: 9 mL, Rfl: 3   Allergies  Allergen Reactions   Eggs Or Egg-Derived Products     Allergic to the protein in the egg white   Latex    Other Itching and Other (See Comments)    Other reaction(s): Unknown   Peanut-Containing Drug Products Other (See Comments)   Penicillins    Shellfish Allergy Hives, Itching, Photosensitivity and Rash   Shiitake Mushroom Hives, Itching, Photosensitivity and Rash  Review of Systems  Constitutional: Negative.   Eyes:  Negative for blurred vision.  Respiratory: Negative.    Cardiovascular: Negative.  Negative for chest pain.  Gastrointestinal: Negative.   Endocrine: Positive for cold intolerance.  Neurological: Negative.   Psychiatric/Behavioral: Negative.      Today's Vitals   02/16/22 0839  BP: 126/70  Pulse: 61  Temp: 97.9 F (36.6 C)  Weight: 206 lb 9.6 oz (93.7 kg)  Height: 5\' 4"  (1.626 m)   Body mass index is 35.46 kg/m.  Wt Readings from Last 3 Encounters:  02/16/22 206 lb 9.6 oz (93.7 kg)  02/10/22 204 lb (92.5 kg)  01/20/22 205 lb (93 kg)    BP Readings from Last 3 Encounters:   02/16/22 126/70  02/10/22 123/75  01/20/22 126/84    Objective:  Physical Exam Vitals and nursing note reviewed.  Constitutional:      Appearance: Normal appearance. She is obese.  HENT:     Head: Normocephalic and atraumatic.     Nose:     Comments: Masked     Mouth/Throat:     Comments: Masked  Eyes:     Extraocular Movements: Extraocular movements intact.  Cardiovascular:     Rate and Rhythm: Normal rate and regular rhythm.     Heart sounds: Normal heart sounds.  Pulmonary:     Effort: Pulmonary effort is normal.     Breath sounds: Normal breath sounds.  Musculoskeletal:     Cervical back: Normal range of motion.  Skin:    General: Skin is warm.  Neurological:     General: No focal deficit present.     Mental Status: She is alert.  Psychiatric:        Mood and Affect: Mood normal.        Behavior: Behavior normal.     Assessment And Plan:     1. Diabetes mellitus type 2 in obese [facility]) Comments: I will check labs as below, last a1c 5.8. She will c/w semaglutide once weekly.  She will f/u in 4 months.  - BMP8+EGFR - Hemoglobin A1c  2. Pure hypercholesterolemia Comments: Last LDL 108 in Sept 2022. I will increase pravastatin 80mg  M-F dosing. She is also encouraged to avoid fried foods, stay active and increase fiber intake.  - Lipid panel; Future - ALT; Future  3. Cold intolerance Comments: I will check CBC and iron panel today. Thyroid function was normal Sept 2022.  - CBC no Diff - Iron, TIBC and Ferritin Panel  4. Class 2 severe obesity due to excess calories with serious comorbidity and body mass index (BMI) of 35.0 to 35.9 in adult [facility]) Comments: She is encouraged to strive for BMI <30 to decrease cardiac risk. Advised to aim for at least 148min exercise/wk. She will also c/w [facility] clinic.   5. Immunization due Comments: She was given Shingrix IM x 1. This is her second dose.  - Varicella-zoster vaccine IM (Shingrix)  Patient was given opportunity to  ask questions. Patient verbalized understanding of the plan and was able to repeat key elements of the plan. All questions were answered to their satisfaction.   I, Maximino Greenland, MD, have reviewed all documentation for this visit. The documentation on 02/16/22 for the exam, diagnosis, procedures, and orders are all accurate and complete.   IF YOU HAVE BEEN REFERRED TO A SPECIALIST, IT MAY TAKE 1-2 WEEKS TO SCHEDULE/PROCESS THE REFERRAL. IF YOU HAVE NOT HEARD FROM US/SPECIALIST IN TWO WEEKS, PLEASE GIVE [place] A  CALL AT [phone] X [phone].   THE PATIENT IS ENCOURAGED TO PRACTICE SOCIAL DISTANCING DUE TO THE COVID-19 PANDEMIC.

## 2022-02-16 NOTE — Patient Instructions (Signed)

## 2022-02-17 LAB — CBC
Hematocrit: 39.2 % (ref 34.0–46.6)
Hemoglobin: 12.2 g/dL (ref 11.1–15.9)
MCH: 21.8 pg — ABNORMAL LOW (ref 26.6–33.0)
MCHC: 31.1 g/dL — ABNORMAL LOW (ref 31.5–35.7)
MCV: 70 fL — ABNORMAL LOW (ref 79–97)
Platelets: 225 10*3/uL (ref 150–450)
RBC: 5.6 x10E6/uL — ABNORMAL HIGH (ref 3.77–5.28)
RDW: 16.5 % — ABNORMAL HIGH (ref 11.7–15.4)
WBC: 4.2 10*3/uL (ref 3.4–10.8)

## 2022-02-17 LAB — BMP8+EGFR
BUN/Creatinine Ratio: 19 (ref 12–28)
BUN: 18 mg/dL (ref 8–27)
CO2: 23 mmol/L (ref 20–29)
Calcium: 9.4 mg/dL (ref 8.7–10.3)
Chloride: 103 mmol/L (ref 96–106)
Creatinine, Ser: 0.96 mg/dL (ref 0.57–1.00)
Glucose: 73 mg/dL (ref 70–99)
Potassium: 4.5 mmol/L (ref 3.5–5.2)
Sodium: 138 mmol/L (ref 134–144)
eGFR: 66 mL/min/{1.73_m2} (ref >59)

## 2022-02-17 LAB — IRON,TIBC AND FERRITIN PANEL
Ferritin: 104 ng/mL (ref 15–150)
Iron Saturation: 28 % (ref 15–55)
Iron: 81 ug/dL (ref 27–139)
Total Iron Binding Capacity: 290 ug/dL (ref 250–450)
UIBC: 209 ug/dL (ref 118–369)

## 2022-02-17 LAB — HEMOGLOBIN A1C
Est. average glucose Bld gHb Est-mCnc: 120 mg/dL
Hgb A1c MFr Bld: 5.8 % — ABNORMAL HIGH (ref 4.8–5.6)

## 2022-03-09 ENCOUNTER — Encounter (INDEPENDENT_AMBULATORY_CARE_PROVIDER_SITE_OTHER): Payer: Self-pay | Admitting: Bariatrics

## 2022-03-09 ENCOUNTER — Other Ambulatory Visit: Payer: Self-pay

## 2022-03-09 ENCOUNTER — Ambulatory Visit (INDEPENDENT_AMBULATORY_CARE_PROVIDER_SITE_OTHER): Payer: 59 | Admitting: Bariatrics

## 2022-03-09 VITALS — BP 132/85 | HR 73 | Temp 98.0°F | Ht 64.0 in | Wt 200.0 lb

## 2022-03-09 DIAGNOSIS — Z7985 Long-term (current) use of injectable non-insulin antidiabetic drugs: Secondary | ICD-10-CM

## 2022-03-09 DIAGNOSIS — Z6834 Body mass index (BMI) 34.0-34.9, adult: Secondary | ICD-10-CM | POA: Diagnosis not present

## 2022-03-09 DIAGNOSIS — E7800 Pure hypercholesterolemia, unspecified: Secondary | ICD-10-CM

## 2022-03-09 DIAGNOSIS — E669 Obesity, unspecified: Secondary | ICD-10-CM | POA: Diagnosis not present

## 2022-03-09 DIAGNOSIS — E1169 Type 2 diabetes mellitus with other specified complication: Secondary | ICD-10-CM

## 2022-03-11 ENCOUNTER — Encounter (INDEPENDENT_AMBULATORY_CARE_PROVIDER_SITE_OTHER): Payer: Self-pay | Admitting: Bariatrics

## 2022-03-11 NOTE — Progress Notes (Signed)
? ? ? ?Chief Complaint:  ? ?OBESITY ?Stephanie French is here to discuss her progress with her obesity treatment plan along with follow-up of her obesity related diagnoses. Stephanie French is on the Category 1 Plan and states she is following her eating plan approximately 50% of the time. Stephanie French states she is walking 10,000 steps 5 times per week. ? ?Today's visit was #: 10 ?Starting weight: 213 lbs ?Starting date: 09/03/2021 ?Today's weight: 200 lbs ?Today's date: 03/09/2022 ?Total lbs lost to date: 3 lbs ?Total lbs lost since last in-office visit: 4 ? ?Interim History: Stephanie French is down an additional 4 lbs since her last visit. She states that she did not do any binge eating. ? ?Subjective:  ? ?1. Diabetes mellitus type 2 in obese [facility]) ?Stephanie French is currently taking Wegovy.  ? ?2. Pure hypercholesterolemia ?Stephanie French is taking Pravachol currently. ? ?Assessment/Plan:  ? ?1. Diabetes mellitus type 2 in obese [facility]) ?Stephanie French will continue taking Wegovy. Good blood sugar control is important to decrease the likelihood of diabetic complications such as nephropathy, neuropathy, limb loss, blindness, coronary artery disease, and death. Intensive lifestyle modification including diet, exercise and weight loss are the first line of treatment for diabetes.  ? ?2. Pure hypercholesterolemia ?Cardiovascular risk and specific lipid/LDL goals reviewed.  Stephanie French will continue taking Pravachol. We discussed several lifestyle modifications today and Stephanie French will continue to work on diet, exercise and weight loss efforts. Orders and follow up as documented in patient record.  ? ?Counseling ?Intensive lifestyle modifications are the first line treatment for this issue. ?Dietary changes: Increase soluble fiber. Decrease simple carbohydrates. ?Exercise changes: Moderate to vigorous-intensity aerobic activity 150 minutes per week if tolerated. ?Lipid-lowering medications: see documented in medical record. ? ?3. Obesity, current BMI 34.5 ?Stephanie French is currently in the action stage  of change. As such, her goal is to continue with weight loss efforts. She has agreed to the Category 1 Plan and keeping a food journal and adhering to recommended goals of 1100 calories and 80 grams of protein.  ? ?Stephanie French will adhere more closely to the plan 80-90%. ? ?Exercise goals:  As is. ? ?Behavioral modification strategies: increasing lean protein intake, decreasing simple carbohydrates, increasing vegetables, increasing water intake, decreasing eating out, no skipping meals, meal planning and cooking strategies, keeping healthy foods in the home, and planning for success. ? ?Stephanie French has agreed to follow-up with our clinic in 3 weeks. She was informed of the importance of frequent follow-up visits to maximize her success with intensive lifestyle modifications for her multiple health conditions.  ? ?Objective:  ? ?Blood pressure 132/85, pulse 73, temperature 98 ?F (36.7 ?C), height '5\' 4"'$  (1.626 m), weight 200 lb (90.7 kg), last menstrual period 08/12/2012, SpO2 94 %. ?Body mass index is 34.33 kg/m?. ? ?General: Cooperative, alert, well developed, in no acute distress. ?HEENT: Conjunctivae and lids unremarkable. ?Cardiovascular: Regular rhythm.  ?Lungs: Normal work of breathing. ?Neurologic: No focal deficits.  ? ?Lab Results  ?Component Value Date  ? CREATININE 0.96 02/16/2022  ? BUN 18 02/16/2022  ? NA 138 02/16/2022  ? K 4.5 02/16/2022  ? CL 103 02/16/2022  ? CO2 23 02/16/2022  ? ?Lab Results  ?Component Value Date  ? ALT 16 10/13/2021  ? AST 22 10/13/2021  ? ALKPHOS 81 10/13/2021  ? BILITOT 0.3 10/13/2021  ? ?Lab Results  ?Component Value Date  ? HGBA1C 5.8 (H) 02/16/2022  ? HGBA1C 5.8 (H) 10/13/2021  ? HGBA1C 6.0 (H) 06/11/2021  ? HGBA1C 6.1 (H) 02/10/2021  ?  HGBA1C 6.3 (H) 10/07/2020  ? ?Lab Results  ?Component Value Date  ? INSULIN 15.4 09/04/2021  ? ?Lab Results  ?Component Value Date  ? TSH 2.200 09/04/2021  ? ?Lab Results  ?Component Value Date  ? CHOL 203 (H) 09/04/2021  ? HDL 86 09/04/2021  ? LDLCALC  108 (H) 09/04/2021  ? TRIG 51 09/04/2021  ? CHOLHDL 2.0 10/07/2020  ? ?Lab Results  ?Component Value Date  ? VD25OH 50.6 09/04/2021  ? VD25OH 21.9 (L) 10/07/2020  ? VD25OH 17.8 (L) 02/19/2020  ? ?Lab Results  ?Component Value Date  ? WBC 4.2 02/16/2022  ? HGB 12.2 02/16/2022  ? HCT 39.2 02/16/2022  ? MCV 70 (L) 02/16/2022  ? PLT 225 02/16/2022  ? ?Lab Results  ?Component Value Date  ? IRON 81 02/16/2022  ? TIBC 290 02/16/2022  ? FERRITIN 104 02/16/2022  ? ?Attestation Statements:  ? ?Reviewed by clinician on day of visit: allergies, medications, problem list, medical history, surgical history, family history, social history, and previous encounter notes. ? ?I, Lizbeth Bark, RMA, am acting as transcriptionist for [employer], DO. ? ?I have reviewed the above documentation for accuracy and completeness, and I agree with the above. Jearld Lesch, DO ? ?

## 2022-03-17 ENCOUNTER — Encounter: Payer: Self-pay | Admitting: Internal Medicine

## 2022-03-18 ENCOUNTER — Ambulatory Visit (INDEPENDENT_AMBULATORY_CARE_PROVIDER_SITE_OTHER): Payer: 59 | Admitting: Nurse Practitioner

## 2022-03-18 ENCOUNTER — Encounter: Payer: Self-pay | Admitting: Nurse Practitioner

## 2022-03-18 ENCOUNTER — Other Ambulatory Visit: Payer: Self-pay

## 2022-03-18 VITALS — BP 128/68 | HR 81 | Temp 98.3°F | Ht 64.0 in | Wt 201.0 lb

## 2022-03-18 DIAGNOSIS — R3915 Urgency of urination: Secondary | ICD-10-CM | POA: Diagnosis not present

## 2022-03-18 DIAGNOSIS — R1030 Lower abdominal pain, unspecified: Secondary | ICD-10-CM | POA: Diagnosis not present

## 2022-03-18 DIAGNOSIS — E6609 Other obesity due to excess calories: Secondary | ICD-10-CM

## 2022-03-18 DIAGNOSIS — Z6834 Body mass index (BMI) 34.0-34.9, adult: Secondary | ICD-10-CM | POA: Diagnosis not present

## 2022-03-18 LAB — POCT URINALYSIS DIPSTICK
Glucose, UA: NEGATIVE
Ketones, UA: NEGATIVE
Leukocytes, UA: NEGATIVE
Nitrite, UA: NEGATIVE
Protein, UA: NEGATIVE
Spec Grav, UA: >=1.03 — AB (ref 1.010–1.025)
Urobilinogen, UA: 0.2 E.U./dL
pH, UA: 6 (ref 5.0–8.0)

## 2022-03-18 MED ORDER — NITROFURANTOIN MONOHYD MACRO 100 MG PO CAPS: 100.0000 mg | ORAL_CAPSULE | Freq: Two times a day (BID) | ORAL | 0 refills | Status: AC

## 2022-03-18 NOTE — Progress Notes (Signed)
?Industrial/product designer as a Education administrator for [employer], FNP.,have documented all relevant documentation on the behalf of Minette Brine, FNP,as directed by  Minette Brine, FNP while in the presence of Minette Brine, [place]. ? ?This visit occurred during the SARS-CoV-2 public health emergency.  Safety protocols were in place, including screening questions prior to the visit, additional usage of staff PPE, and extensive cleaning of exam room while observing appropriate contact time as indicated for disinfecting solutions. ? ?Subjective:  ?  ? Patient ID: Stephanie French , female    DOB: [birth year] , 66 y.o.   MRN: [id number] ? ? ?Chief Complaint  ?Patient presents with  ? Abdominal Pain  ? ? ?HPI ? ?Patient is here for abdominal pain and urinary pressure. Pain is worse when she goes to the bathroom. She had her left ovary removed years ago. Last week had nausea, vomiting and diarrhea she has recently started taking atorvastatin thought was related to body adjusting. She is having loose bowels. ? ?Abdominal Pain ?This is a new problem. The onset quality is sudden. The problem occurs intermittently. The quality of the pain is aching (she will have to stop if her pain is present). The abdominal pain radiates to the suprapubic region. Pertinent negatives include no anorexia, arthralgias, constipation, frequency, hematuria, nausea, vomiting or weight loss. Exacerbated by: pressure worse at night when sleeping. Relieved by: drinking water. She has tried nothing for the symptoms. There is no history of abdominal surgery.   ? ?Past Medical History:  ?Diagnosis Date  ? Anemia   ? Anxiety   ? Atypical chest pain 08/31/2019  ? Back pain   ? Constipation   ? Depression   ? Diabetes mellitus   ? Edema of both lower extremities   ? GERD (gastroesophageal reflux disease)   ? Hyperlipidemia   ? Joint pain   ? Obesity   ? Pure hypercholesterolemia 12/07/2019  ? Shortness of breath 08/31/2019  ? Vitamin D deficiency   ?  ? ?Family History   ?Problem Relation Age of Onset  ? Diabetes Mother   ? Hypertension Mother   ? Stroke Mother   ? Heart attack Mother   ? High Cholesterol Mother   ? Early death Father   ? Lung cancer Father   ? Diabetes Sister   ? Hypertension Sister   ? Breast cancer Maternal Grandmother   ? Heart attack Maternal Grandmother   ? Cancer Other   ? Hypertension Other   ? Hyperlipidemia Other   ? Stroke Other   ? Heart attack Other   ? ? ? ?Current Outpatient Medications:  ?  hydrocortisone (ANUSOL-HC) 2.5 % rectal cream, Place 1 application rectally 2 (two) times daily as needed for hemorrhoids or anal itching., Disp: 30 g, Rfl: 0 ?  Multiple Vitamin (MULTIVITAMIN) capsule, Take 1 capsule by mouth daily. For 50 plus, Disp: , Rfl:  ?  pravastatin (PRAVACHOL) 40 MG tablet, Take 1 tablet (40 mg total) by mouth daily., Disp: 90 tablet, Rfl: 1 ?  Semaglutide-Weight Management (WEGOVY) 2.4 MG/0.75ML SOAJ, Inject 2.4 mg into the skin once a week., Disp: 9 mL, Rfl: 3  ? ?Allergies  ?Allergen Reactions  ? Eggs Or Egg-Derived Products   ?  Allergic to the protein in the egg white  ? Latex   ? Other Itching and Other (See Comments)  ?  Other reaction(s): Unknown  ? Peanut-Containing Drug Products Other (See Comments)  ? Penicillins   ? Shellfish Allergy Hives, Itching, Photosensitivity and  Rash  ? Shiitake Mushroom Hives, Itching, Photosensitivity and Rash  ?  ? ?Review of Systems  ?Constitutional: Negative.  Negative for weight loss.  ?Respiratory: Negative.    ?Cardiovascular: Negative.   ?Gastrointestinal:  Positive for abdominal pain. Negative for anorexia, constipation, nausea and vomiting.  ?Genitourinary:  Positive for urgency. Negative for frequency and hematuria.  ?Musculoskeletal:  Negative for arthralgias.  ?Neurological: Negative.   ?Psychiatric/Behavioral: Negative.     ? ?Today's Vitals  ? 03/18/22 1130  ?BP: 128/68  ?Pulse: 81  ?Temp: 98.3 ?F (36.8 ?C)  ?TempSrc: Oral  ?Weight: 201 lb (91.2 kg)  ?Height: '5\' 4"'$  (1.626 m)  ? ?Body  mass index is 34.5 kg/m?.  ?Wt Readings from Last 3 Encounters:  ?03/18/22 201 lb (91.2 kg)  ?03/09/22 200 lb (90.7 kg)  ?02/16/22 206 lb 9.6 oz (93.7 kg)  ? ? ?Objective:  ?Physical Exam ?Constitutional:   ?   General: She is not in acute distress. ?   Appearance: She is well-developed. She is obese.  ?Abdominal:  ?   Tenderness: There is abdominal tenderness in the suprapubic area.  ?Neurological:  ?   Mental Status: She is alert.  ?  ? ?   ?Assessment And Plan:  ?   ?1. Urinary urgency ?Comments: Urinalysis has moderate blood, will send for urine culture.  ?- POCT Urinalysis Dipstick (81002) ?- Culture, Urine ?- nitrofurantoin, macrocrystal-monohydrate, (MACROBID) 100 MG capsule; Take 1 capsule (100 mg total) by mouth 2 (two) times daily for 5 days.  Dispense: 10 capsule; Refill: 0 ? ?2. Lower abdominal pain ?Comments: Pain to suprapubic area, encouraged to stay well hydrated with water. Will treat for urinary tract infection empircally ? ?3. Class 1 obesity due to excess calories with serious comorbidity and body mass index (BMI) of 34.0 to 34.9 in adult ?She is encouraged to strive for BMI less than 30 to decrease cardiac risk. Advised to aim for at least 150 minutes of exercise per week. ? ? ? ?Patient was given opportunity to ask questions. Patient verbalized understanding of the plan and was able to repeat key elements of the plan. All questions were answered to their satisfaction.  ?Minette Brine, FNP  ? ?I, Minette Brine, FNP, have reviewed all documentation for this visit. The documentation on 03/18/22 for the exam, diagnosis, procedures, and orders are all accurate and complete.  ? ?IF YOU HAVE BEEN REFERRED TO A SPECIALIST, IT MAY TAKE 1-2 WEEKS TO SCHEDULE/PROCESS THE REFERRAL. IF YOU HAVE NOT HEARD FROM US/SPECIALIST IN TWO WEEKS, PLEASE GIVE [place] A CALL AT [phone] X [phone].  ? ?THE PATIENT IS ENCOURAGED TO PRACTICE SOCIAL DISTANCING DUE TO THE COVID-19 PANDEMIC.   ?

## 2022-03-18 NOTE — Patient Instructions (Signed)

## 2022-03-20 LAB — URINE CULTURE

## 2022-04-06 ENCOUNTER — Ambulatory Visit (INDEPENDENT_AMBULATORY_CARE_PROVIDER_SITE_OTHER): Payer: 59 | Admitting: Bariatrics

## 2022-04-06 ENCOUNTER — Other Ambulatory Visit: Payer: 59

## 2022-04-06 DIAGNOSIS — E7800 Pure hypercholesterolemia, unspecified: Secondary | ICD-10-CM

## 2022-04-07 LAB — LIPID PANEL
Chol/HDL Ratio: 1.7 ratio (ref 0.0–4.4)
Cholesterol, Total: 162 mg/dL (ref 100–199)
HDL: 93 mg/dL (ref >39)
LDL Chol Calc (NIH): 60 mg/dL (ref 0–99)
Triglycerides: 38 mg/dL (ref 0–149)
VLDL Cholesterol Cal: 9 mg/dL (ref 5–40)

## 2022-04-07 LAB — ALT: ALT: 16 IU/L (ref 0–32)

## 2022-05-11 ENCOUNTER — Ambulatory Visit (INDEPENDENT_AMBULATORY_CARE_PROVIDER_SITE_OTHER): Payer: 59 | Admitting: Bariatrics

## 2022-05-19 ENCOUNTER — Encounter: Payer: Self-pay | Admitting: Internal Medicine

## 2022-05-19 ENCOUNTER — Other Ambulatory Visit: Payer: Self-pay

## 2022-05-19 ENCOUNTER — Other Ambulatory Visit (HOSPITAL_COMMUNITY): Payer: Self-pay

## 2022-05-19 ENCOUNTER — Other Ambulatory Visit: Payer: Self-pay | Admitting: Internal Medicine

## 2022-05-19 MED ORDER — GLUCOSE BLOOD VI STRP
ORAL_STRIP | 12 refills | Status: DC
  Filled 2022-05-19: qty 100, 100d supply, fill #0

## 2022-05-19 MED ORDER — PRAVASTATIN SODIUM 40 MG PO TABS: 40.0000 mg | ORAL_TABLET | Freq: Every day | ORAL | 1 refills | Status: DC

## 2022-05-19 MED ORDER — GLUCOSE BLOOD VI STRP: ORAL_STRIP | 12 refills | Status: DC

## 2022-05-19 MED ORDER — PRAVASTATIN SODIUM 40 MG PO TABS
40.0000 mg | ORAL_TABLET | Freq: Every day | ORAL | 1 refills | Status: DC
  Filled 2022-05-19: qty 30, 30d supply, fill #0

## 2022-05-20 ENCOUNTER — Ambulatory Visit (INDEPENDENT_AMBULATORY_CARE_PROVIDER_SITE_OTHER): Payer: 59 | Admitting: Bariatrics

## 2022-05-20 ENCOUNTER — Encounter (INDEPENDENT_AMBULATORY_CARE_PROVIDER_SITE_OTHER): Payer: Self-pay | Admitting: Bariatrics

## 2022-05-20 VITALS — BP 123/67 | HR 70 | Temp 97.9°F | Ht 64.0 in | Wt 199.0 lb

## 2022-05-20 DIAGNOSIS — E7800 Pure hypercholesterolemia, unspecified: Secondary | ICD-10-CM | POA: Diagnosis not present

## 2022-05-20 DIAGNOSIS — E1165 Type 2 diabetes mellitus with hyperglycemia: Secondary | ICD-10-CM | POA: Diagnosis not present

## 2022-05-20 DIAGNOSIS — E669 Obesity, unspecified: Secondary | ICD-10-CM | POA: Diagnosis not present

## 2022-05-20 DIAGNOSIS — Z6834 Body mass index (BMI) 34.0-34.9, adult: Secondary | ICD-10-CM | POA: Diagnosis not present

## 2022-05-20 DIAGNOSIS — Z6836 Body mass index (BMI) 36.0-36.9, adult: Secondary | ICD-10-CM

## 2022-05-20 DIAGNOSIS — Z7985 Long-term (current) use of injectable non-insulin antidiabetic drugs: Secondary | ICD-10-CM

## 2022-05-22 NOTE — Progress Notes (Signed)
Chief Complaint:   OBESITY Stephanie French is here to discuss her progress with her obesity treatment plan along with follow-up of her obesity related diagnoses. Stephanie French is on the Category 1 Plan and keeping a food journal and adhering to recommended goals of 1100 calories and 80 grams of protein and states she is following her eating plan approximately 40% of the time. Stephanie French states she is walking for 1 1/2 hours 5 times per week.  Today's visit was #: 11 Starting weight: 213 lbs Starting date: 09/03/2021 Today's weight: 199 lbs Today's date: 05/20/2022 Total lbs lost to date: 14 lbs Total lbs lost since last in-office visit: 1 lb  Interim History: Stephanie French is down 1 additional pound since her last visit. She is still working on getting adequate water and protein intake.   Subjective:   1. Uncontrolled type 2 diabetes mellitus with hyperglycemia (HCC) Stephanie French is currently taking [place].   2. Pure hypercholesterolemia Stephanie French is taking Pravachol currently.   Assessment/Plan:   1. Uncontrolled type 2 diabetes mellitus with hyperglycemia (HCC) Stephanie French will continue taking Wegovy. Good blood sugar control is important to decrease the likelihood of diabetic complications such as nephropathy, neuropathy, limb loss, blindness, coronary artery disease, and death. Intensive lifestyle modification including diet, exercise and weight loss are the first line of treatment for diabetes.   2. Pure hypercholesterolemia Cardiovascular risk and specific lipid/LDL goals reviewed.  We discussed several lifestyle modifications today and Stephanie French will continue to work on diet, exercise and weight loss efforts. Stephanie French will continue taking Pravachol. Orders and follow up as documented in patient record.   Counseling Intensive lifestyle modifications are the first line treatment for this issue. Dietary changes: Increase soluble fiber. Decrease simple carbohydrates. Exercise changes: Moderate to vigorous-intensity aerobic  activity 150 minutes per week if tolerated. Lipid-lowering medications: see documented in medical record.  3. Obesity, current BMI 34.2 Stephanie French is currently in the action stage of change. As such, her goal is to continue with weight loss efforts. She has agreed to keeping a food journal and adhering to recommended goals of 1100 calories and 80 grams of  protein.   Stephanie French will adhere closely to the plan 80-90%. Handouts for high protein was provided today. She will increase her water intake.   Exercise goals:  As is.   Behavioral modification strategies: increasing lean protein intake, decreasing simple carbohydrates, increasing vegetables, increasing water intake, decreasing eating out, no skipping meals, meal planning and cooking strategies, keeping healthy foods in the home, and planning for success.  Stephanie French has agreed to follow-up with our clinic in 3-4 weeks. She was informed of the importance of frequent follow-up visits to maximize her success with intensive lifestyle modifications for her multiple health conditions.   Objective:   Blood pressure 123/67, pulse 70, temperature 97.9 F (36.6 C), height '5\' 4"'$  (1.626 m), weight 199 lb (90.3 kg), last menstrual period 08/12/2012, SpO2 99 %. Body mass index is 34.16 kg/m.  General: Cooperative, alert, well developed, in no acute distress. HEENT: Conjunctivae and lids unremarkable. Cardiovascular: Regular rhythm.  Lungs: Normal work of breathing. Neurologic: No focal deficits.   Lab Results  Component Value Date   CREATININE 0.96 02/16/2022   BUN 18 02/16/2022   NA 138 02/16/2022   K 4.5 02/16/2022   CL 103 02/16/2022   CO2 23 02/16/2022   Lab Results  Component Value Date   ALT 16 04/06/2022   AST 22 10/13/2021   ALKPHOS 81 10/13/2021   BILITOT 0.3  10/13/2021   Lab Results  Component Value Date   HGBA1C 5.8 (H) 02/16/2022   HGBA1C 5.8 (H) 10/13/2021   HGBA1C 6.0 (H) 06/11/2021   HGBA1C 6.1 (H) 02/10/2021   HGBA1C 6.3 (H)  10/07/2020   Lab Results  Component Value Date   INSULIN 15.4 09/04/2021   Lab Results  Component Value Date   TSH 2.200 09/04/2021   Lab Results  Component Value Date   CHOL 162 04/06/2022   HDL 93 04/06/2022   LDLCALC 60 04/06/2022   TRIG 38 04/06/2022   CHOLHDL 1.7 04/06/2022   Lab Results  Component Value Date   VD25OH 50.6 09/04/2021   VD25OH 21.9 (L) 10/07/2020   VD25OH 17.8 (L) 02/19/2020   Lab Results  Component Value Date   WBC 4.2 02/16/2022   HGB 12.2 02/16/2022   HCT 39.2 02/16/2022   MCV 70 (L) 02/16/2022   PLT 225 02/16/2022   Lab Results  Component Value Date   IRON 81 02/16/2022   TIBC 290 02/16/2022   FERRITIN 104 02/16/2022   Attestation Statements:   Reviewed by clinician on day of visit: allergies, medications, problem list, medical history, surgical history, family history, social history, and previous encounter notes.  I, Lizbeth Bark, RMA, am acting as Location manager for [employer], DO.  I have reviewed the above documentation for accuracy and completeness, and I agree with the above. Jearld Lesch, DO

## 2022-05-27 ENCOUNTER — Encounter (INDEPENDENT_AMBULATORY_CARE_PROVIDER_SITE_OTHER): Payer: Self-pay | Admitting: Bariatrics

## 2022-05-28 ENCOUNTER — Other Ambulatory Visit: Payer: Self-pay

## 2022-05-28 MED ORDER — GLUCOSE BLOOD VI STRP: ORAL_STRIP | 12 refills | Status: DC

## 2022-06-01 ENCOUNTER — Other Ambulatory Visit: Payer: Self-pay

## 2022-06-01 MED ORDER — GLUCOSE BLOOD VI STRP: ORAL_STRIP | 2 refills | Status: DC

## 2022-06-11 ENCOUNTER — Ambulatory Visit (INDEPENDENT_AMBULATORY_CARE_PROVIDER_SITE_OTHER): Payer: 59 | Admitting: Bariatrics

## 2022-06-11 ENCOUNTER — Encounter (INDEPENDENT_AMBULATORY_CARE_PROVIDER_SITE_OTHER): Payer: Self-pay | Admitting: Bariatrics

## 2022-06-11 VITALS — BP 112/70 | HR 78 | Temp 97.8°F | Ht 64.0 in | Wt 202.0 lb

## 2022-06-11 DIAGNOSIS — E7800 Pure hypercholesterolemia, unspecified: Secondary | ICD-10-CM | POA: Diagnosis not present

## 2022-06-11 DIAGNOSIS — E1165 Type 2 diabetes mellitus with hyperglycemia: Secondary | ICD-10-CM

## 2022-06-11 DIAGNOSIS — E669 Obesity, unspecified: Secondary | ICD-10-CM | POA: Diagnosis not present

## 2022-06-11 DIAGNOSIS — Z6834 Body mass index (BMI) 34.0-34.9, adult: Secondary | ICD-10-CM

## 2022-06-11 DIAGNOSIS — Z7985 Long-term (current) use of injectable non-insulin antidiabetic drugs: Secondary | ICD-10-CM

## 2022-06-11 DIAGNOSIS — E66812 Obesity, class 2: Secondary | ICD-10-CM

## 2022-06-15 ENCOUNTER — Ambulatory Visit (INDEPENDENT_AMBULATORY_CARE_PROVIDER_SITE_OTHER): Payer: 59 | Admitting: Bariatrics

## 2022-06-16 ENCOUNTER — Encounter: Payer: Self-pay | Admitting: Internal Medicine

## 2022-06-16 ENCOUNTER — Encounter (INDEPENDENT_AMBULATORY_CARE_PROVIDER_SITE_OTHER): Payer: Self-pay | Admitting: Bariatrics

## 2022-06-16 ENCOUNTER — Ambulatory Visit (INDEPENDENT_AMBULATORY_CARE_PROVIDER_SITE_OTHER): Payer: 59 | Admitting: Internal Medicine

## 2022-06-16 VITALS — BP 120/70 | HR 77 | Temp 98.0°F | Ht 66.8 in | Wt 204.2 lb

## 2022-06-16 DIAGNOSIS — E6609 Other obesity due to excess calories: Secondary | ICD-10-CM | POA: Diagnosis not present

## 2022-06-16 DIAGNOSIS — Z6834 Body mass index (BMI) 34.0-34.9, adult: Secondary | ICD-10-CM | POA: Diagnosis not present

## 2022-06-16 DIAGNOSIS — E1169 Type 2 diabetes mellitus with other specified complication: Secondary | ICD-10-CM

## 2022-06-16 DIAGNOSIS — E785 Hyperlipidemia, unspecified: Secondary | ICD-10-CM | POA: Diagnosis not present

## 2022-06-17 LAB — CMP14+EGFR
ALT: 15 IU/L (ref 0–32)
AST: 16 IU/L (ref 0–40)
Albumin/Globulin Ratio: 1.7 (ref 1.2–2.2)
Albumin: 4.3 g/dL (ref 3.8–4.8)
Alkaline Phosphatase: 81 IU/L (ref 44–121)
BUN/Creatinine Ratio: 14 (ref 12–28)
BUN: 13 mg/dL (ref 8–27)
Bilirubin Total: <0.2 mg/dL (ref 0.0–1.2)
CO2: 23 mmol/L (ref 20–29)
Calcium: 9.3 mg/dL (ref 8.7–10.3)
Chloride: 106 mmol/L (ref 96–106)
Creatinine, Ser: 0.92 mg/dL (ref 0.57–1.00)
Globulin, Total: 2.5 g/dL (ref 1.5–4.5)
Glucose: 82 mg/dL (ref 70–99)
Potassium: 4.8 mmol/L (ref 3.5–5.2)
Sodium: 142 mmol/L (ref 134–144)
Total Protein: 6.8 g/dL (ref 6.0–8.5)
eGFR: 70 mL/min/{1.73_m2} (ref >59)

## 2022-06-17 LAB — HEMOGLOBIN A1C
Est. average glucose Bld gHb Est-mCnc: 126 mg/dL
Hgb A1c MFr Bld: 6 % — ABNORMAL HIGH (ref 4.8–5.6)

## 2022-06-19 ENCOUNTER — Telehealth: Payer: Self-pay

## 2022-06-19 NOTE — Telephone Encounter (Signed)
Called to discuss PREP program; left voicemail

## 2022-06-26 ENCOUNTER — Telehealth: Payer: Self-pay

## 2022-06-26 NOTE — Telephone Encounter (Signed)
Call to pt reference next PREP class starting on 07/20/22 at [facility] Will be a M/W 1p-215p class Needs to check with work to see if she can do that schedule Confirmed she has my number for call back

## 2022-07-13 ENCOUNTER — Encounter (INDEPENDENT_AMBULATORY_CARE_PROVIDER_SITE_OTHER): Payer: Self-pay | Admitting: Bariatrics

## 2022-07-13 ENCOUNTER — Ambulatory Visit (INDEPENDENT_AMBULATORY_CARE_PROVIDER_SITE_OTHER): Payer: 59 | Admitting: Bariatrics

## 2022-07-13 VITALS — BP 115/74 | HR 58 | Temp 97.9°F | Ht 64.0 in | Wt 200.0 lb

## 2022-07-13 DIAGNOSIS — R5383 Other fatigue: Secondary | ICD-10-CM

## 2022-07-13 DIAGNOSIS — R0602 Shortness of breath: Secondary | ICD-10-CM

## 2022-07-13 DIAGNOSIS — E669 Obesity, unspecified: Secondary | ICD-10-CM | POA: Diagnosis not present

## 2022-07-13 DIAGNOSIS — Z7985 Long-term (current) use of injectable non-insulin antidiabetic drugs: Secondary | ICD-10-CM

## 2022-07-13 DIAGNOSIS — E1169 Type 2 diabetes mellitus with other specified complication: Secondary | ICD-10-CM

## 2022-07-13 DIAGNOSIS — Z6834 Body mass index (BMI) 34.0-34.9, adult: Secondary | ICD-10-CM

## 2022-07-20 NOTE — Progress Notes (Unsigned)
Chief Complaint:   OBESITY Stephanie French is here to discuss her progress with her obesity treatment plan along with follow-up of her obesity related diagnoses. Stephanie French is on the Category 1 Plan and states she is following her eating plan approximately 50% of the time. Stephanie French states she is doing 0 minutes 0 times per week.  Today's visit was #: 57 Starting weight: 213 lbs Starting date: 09/03/2021 Today's weight: 200 lbs Today's date: 07/13/2022 Total lbs lost to date: 13 Total lbs lost since last in-office visit: 2  Interim History: Stephanie French is down 2 pounds since her last visit and she has done fairly well overall.  She is doing better with her protein.  Subjective:   1. Diabetes mellitus type 2 in obese [facility]) Sherria is currently taking Wegovy.  2. Other fatigue Stephanie French's RMR is lower than expect at 1339, expects RMR 1544. Her previous RMR was 1382.  3. Shortness of breath Stephanie French's RMR is lower than expect at 1339, expects RMR 1544. Her previous RMR was 1382.  Assessment/Plan:   1. Diabetes mellitus type 2 in obese [facility]) Stephanie French will continue her medications as directed.  2. Other fatigue We discussed the patient's IC for about 10 minutes, and discussed the implications for weight reduction and weight maintenance.  3. Shortness of breath We discussed the patient's IC for about 10 minutes, and discussed the implications for weight reduction and weight maintenance.  4. Obesity, current BMI 34.5 Stephanie French is currently in the action stage of change. As such, her goal is to continue with weight loss efforts. She has agreed to the Category 1 Plan.  She will adhere more closely to the plan.  Meal planning was discussed.  She will have a protein 1 bar.  Exercise goals: She will start an exercise program at [facility] in 12 weeks.  She will do some light weights.  Behavioral modification strategies: increasing lean protein intake, decreasing simple carbohydrates, increasing vegetables, increasing water  intake, decreasing eating out, no skipping meals, meal planning and cooking strategies, keeping healthy foods in the home, and planning for success.  Stephanie French has agreed to follow-up with our clinic in 3 to 4 weeks. She was informed of the importance of frequent follow-up visits to maximize her success with intensive lifestyle modifications for her multiple health conditions.   Objective:   Blood pressure 115/74, pulse (!) 58, temperature 97.9 F (36.6 C), height '5\' 4"'$  (1.626 m), weight 200 lb (90.7 kg), last menstrual period 08/12/2012, SpO2 98 %. Body mass index is 34.33 kg/m.  General: Cooperative, alert, well developed, in no acute distress. HEENT: Conjunctivae and lids unremarkable. Cardiovascular: Regular rhythm.  Lungs: Normal work of breathing. Neurologic: No focal deficits.   Lab Results  Component Value Date   CREATININE 0.92 06/16/2022   BUN 13 06/16/2022   NA 142 06/16/2022   K 4.8 06/16/2022   CL 106 06/16/2022   CO2 23 06/16/2022   Lab Results  Component Value Date   ALT 15 06/16/2022   AST 16 06/16/2022   ALKPHOS 81 06/16/2022   BILITOT <0.2 06/16/2022   Lab Results  Component Value Date   HGBA1C 6.0 (H) 06/16/2022   HGBA1C 5.8 (H) 02/16/2022   HGBA1C 5.8 (H) 10/13/2021   HGBA1C 6.0 (H) 06/11/2021   HGBA1C 6.1 (H) 02/10/2021   Lab Results  Component Value Date   INSULIN 15.4 09/04/2021   Lab Results  Component Value Date   TSH 2.200 09/04/2021   Lab Results  Component Value  Date   CHOL 162 04/06/2022   HDL 93 04/06/2022   LDLCALC 60 04/06/2022   TRIG 38 04/06/2022   CHOLHDL 1.7 04/06/2022   Lab Results  Component Value Date   VD25OH 50.6 09/04/2021   VD25OH 21.9 (L) 10/07/2020   VD25OH 17.8 (L) 02/19/2020   Lab Results  Component Value Date   WBC 4.2 02/16/2022   HGB 12.2 02/16/2022   HCT 39.2 02/16/2022   MCV 70 (L) 02/16/2022   PLT 225 02/16/2022   Lab Results  Component Value Date   IRON 81 02/16/2022   TIBC 290 02/16/2022    FERRITIN 104 02/16/2022   Attestation Statements:   Reviewed by clinician on day of visit: allergies, medications, problem list, medical history, surgical history, family history, social history, and previous encounter notes.  Time spent on visit including pre-visit chart review and post-visit care and charting was 30 minutes.   Wilhemena Durie, am acting as Location manager for [employer], DO.  I have reviewed the above documentation for accuracy and completeness, and I agree with the above. Jearld Lesch, DO

## 2022-07-21 ENCOUNTER — Encounter (INDEPENDENT_AMBULATORY_CARE_PROVIDER_SITE_OTHER): Payer: Self-pay | Admitting: Bariatrics

## 2022-07-29 ENCOUNTER — Encounter (INDEPENDENT_AMBULATORY_CARE_PROVIDER_SITE_OTHER): Payer: Self-pay

## 2022-08-11 ENCOUNTER — Encounter (INDEPENDENT_AMBULATORY_CARE_PROVIDER_SITE_OTHER): Payer: Self-pay | Admitting: Bariatrics

## 2022-08-11 ENCOUNTER — Ambulatory Visit (INDEPENDENT_AMBULATORY_CARE_PROVIDER_SITE_OTHER): Payer: 59 | Admitting: Bariatrics

## 2022-08-11 VITALS — BP 99/66 | HR 69 | Temp 97.7°F | Ht 64.0 in | Wt 203.0 lb

## 2022-08-11 DIAGNOSIS — E7800 Pure hypercholesterolemia, unspecified: Secondary | ICD-10-CM | POA: Diagnosis not present

## 2022-08-11 DIAGNOSIS — Z6834 Body mass index (BMI) 34.0-34.9, adult: Secondary | ICD-10-CM | POA: Diagnosis not present

## 2022-08-11 DIAGNOSIS — E669 Obesity, unspecified: Secondary | ICD-10-CM

## 2022-08-11 DIAGNOSIS — E1169 Type 2 diabetes mellitus with other specified complication: Secondary | ICD-10-CM | POA: Diagnosis not present

## 2022-08-17 NOTE — Progress Notes (Unsigned)
Chief Complaint:   OBESITY Emmalin is here to discuss her progress with her obesity treatment plan along with follow-up of her obesity related diagnoses. Jelina is on the Category 1 Plan and states she is following her eating plan approximately 0% of the time. Adaysha states she is walking 10,000 steps 7 times per week.  Today's visit was #: 14 Starting weight: 213 lbs Starting date: 09/03/2021 Today's weight: 203 lbs Today's date: 08/11/22 Total lbs lost to date: 10 Total lbs lost since last in-office visit: +3  Interim History: She is up 3 pounds since her last visit.  She is doing better with her water and protein.  Subjective:   1. Pure hypercholesterolemia Taking Pravachol.  2. Diabetes mellitus type 2 in obese [facility]) Has been well controlled.   Assessment/Plan:   1. Pure hypercholesterolemia Continue Pravachol.  2. Diabetes mellitus type 2 in obese ([place]) Continue to limit carbohydrates in diet (starches and sugars).  3. Obesity, current BMI 34.9 1.  Meal planning 2.  Intentional eating  Rianna is currently in the action stage of change. As such, her goal is to continue with weight loss efforts. She has agreed to the Category 1 Plan.   Exercise goals: Will get in a class at [facility] (12 weeks). ***  Behavioral modification strategies: increasing lean protein intake, decreasing simple carbohydrates, increasing vegetables, increasing water intake, decreasing eating out, no skipping meals, meal planning and cooking strategies, keeping healthy foods in the home, and planning for success.  Elleni has agreed to follow-up with our clinic in 6 weeks. She was informed of the importance of frequent follow-up visits to maximize her success with intensive lifestyle modifications for her multiple health conditions.   Objective:   Blood pressure 99/66, pulse 69, temperature 97.7 F (36.5 C), height '5\' 4"'$  (1.626 m), weight 203 lb (92.1 kg), last menstrual period 08/12/2012, SpO2 97  %. Body mass index is 34.84 kg/m.  General: Cooperative, alert, well developed, in no acute distress. HEENT: Conjunctivae and lids unremarkable. Cardiovascular: Regular rhythm.  Lungs: Normal work of breathing. Neurologic: No focal deficits.   Lab Results  Component Value Date   CREATININE 0.92 06/16/2022   BUN 13 06/16/2022   NA 142 06/16/2022   K 4.8 06/16/2022   CL 106 06/16/2022   CO2 23 06/16/2022   Lab Results  Component Value Date   ALT 15 06/16/2022   AST 16 06/16/2022   ALKPHOS 81 06/16/2022   BILITOT <0.2 06/16/2022   Lab Results  Component Value Date   HGBA1C 6.0 (H) 06/16/2022   HGBA1C 5.8 (H) 02/16/2022   HGBA1C 5.8 (H) 10/13/2021   HGBA1C 6.0 (H) 06/11/2021   HGBA1C 6.1 (H) 02/10/2021   Lab Results  Component Value Date   INSULIN 15.4 09/04/2021   Lab Results  Component Value Date   TSH 2.200 09/04/2021   Lab Results  Component Value Date   CHOL 162 04/06/2022   HDL 93 04/06/2022   LDLCALC 60 04/06/2022   TRIG 38 04/06/2022   CHOLHDL 1.7 04/06/2022   Lab Results  Component Value Date   VD25OH 50.6 09/04/2021   VD25OH 21.9 (L) 10/07/2020   VD25OH 17.8 (L) 02/19/2020   Lab Results  Component Value Date   WBC 4.2 02/16/2022   HGB 12.2 02/16/2022   HCT 39.2 02/16/2022   MCV 70 (L) 02/16/2022   PLT 225 02/16/2022   Lab Results  Component Value Date   IRON 81 02/16/2022   TIBC 290  02/16/2022   FERRITIN 104 02/16/2022    Attestation Statements:   Reviewed by clinician on day of visit: allergies, medications, problem list, medical history, surgical history, family history, social history, and previous encounter notes.  I, Dawn Whitmire, FNP-C, am acting as transcriptionist for Dr. Jearld Lesch.  I have reviewed the above documentation for accuracy and completeness, and I agree with the above. -  ***

## 2022-08-18 ENCOUNTER — Encounter (INDEPENDENT_AMBULATORY_CARE_PROVIDER_SITE_OTHER): Payer: Self-pay | Admitting: Bariatrics

## 2022-09-03 ENCOUNTER — Telehealth: Payer: Self-pay

## 2022-09-03 NOTE — Telephone Encounter (Signed)
I attempted to call pt to inform her that her form has been completed and faxed. I have placed copy in my drawer. YL,RMA

## 2022-09-14 ENCOUNTER — Telehealth (INDEPENDENT_AMBULATORY_CARE_PROVIDER_SITE_OTHER): Payer: Self-pay | Admitting: Bariatrics

## 2022-09-21 ENCOUNTER — Encounter: Payer: Self-pay | Admitting: Bariatrics

## 2022-09-21 ENCOUNTER — Ambulatory Visit: Payer: 59 | Admitting: Bariatrics

## 2022-09-21 VITALS — BP 122/73 | HR 58 | Temp 98.0°F | Ht 64.0 in | Wt 202.0 lb

## 2022-09-21 DIAGNOSIS — E1169 Type 2 diabetes mellitus with other specified complication: Secondary | ICD-10-CM

## 2022-09-21 DIAGNOSIS — E7800 Pure hypercholesterolemia, unspecified: Secondary | ICD-10-CM

## 2022-09-21 DIAGNOSIS — Z7985 Long-term (current) use of injectable non-insulin antidiabetic drugs: Secondary | ICD-10-CM

## 2022-09-21 DIAGNOSIS — E669 Obesity, unspecified: Secondary | ICD-10-CM | POA: Diagnosis not present

## 2022-09-21 DIAGNOSIS — Z6834 Body mass index (BMI) 34.0-34.9, adult: Secondary | ICD-10-CM

## 2022-09-21 DIAGNOSIS — E559 Vitamin D deficiency, unspecified: Secondary | ICD-10-CM | POA: Diagnosis not present

## 2022-09-22 NOTE — Progress Notes (Unsigned)
Chief Complaint:   OBESITY Stephanie French is here to discuss her progress with her obesity treatment plan along with follow-up of her obesity related diagnoses. Stephanie French is on the Category 1 Plan and states she is following her eating plan approximately 40% of the time. Stephanie French states she is doing 0 minutes 0 times per week.  Today's visit was #: 15 Starting weight: 213 lbs Starting date: 09/03/2021 Today's weight: 202 lbs Today's date: 09/21/2022 Total lbs lost to date: 11 Total lbs lost since last in-office visit: 1  Interim History: Stephanie French down 1 pound since her last visit.  Subjective:   1. Pure hypercholesterolemia Stephanie French is taking Pravachol, and she denies myalgia but notes occasional aches.  2. Vitamin D deficiency Stephanie French not on vitamin D supplementation, but she is taking multivitamins.  3. Diabetes mellitus type 2 in obese [facility]) Stephanie French is taking Wegovy 2.4 mg with no side effects noted.  Assessment/Plan:   1. Pure hypercholesterolemia Stephanie French will continue Pravachol, and she will consider Co-Q10 and will discuss with her PCP.  2. Vitamin D deficiency Stephanie French will continue her multivitamins as directed.  3. Diabetes mellitus type 2 in obese [facility]) Stephanie French will continue Wegovy at 2.4 mg once weekly, as she is tolerating it well.  4. Obesity, current BMI 34.7 Stephanie French is currently in the action stage of change. As such, her goal is to continue with weight loss efforts. She has agreed to the Category 1 Plan.   She will adhere more closely to the plan 80-90%.  She will gradually increase her fiber and water intake.  We will recheck fasting labs at her next visit.  Exercise goals: She will start exercising later this month.  Behavioral modification strategies: increasing lean protein intake, decreasing simple carbohydrates, increasing vegetables, increasing water intake, decreasing eating out, no skipping meals, meal planning and cooking strategies, keeping healthy foods in the home, and  planning for success.  [name] has agreed to follow-up with our clinic in 4 weeks. She was informed of the importance of frequent follow-up visits to maximize her success with intensive lifestyle modifications for her multiple health conditions.   Objective:   Blood pressure 122/73, pulse (!) 58, temperature 98 F (36.7 C), height '5\' 4"'$  (1.626 m), weight 202 lb (91.6 kg), last menstrual period 08/12/2012, SpO2 98 %. Body mass index is 34.67 kg/m.  General: Cooperative, alert, well developed, in no acute distress. HEENT: Conjunctivae and lids unremarkable. Cardiovascular: Regular rhythm.  Lungs: Normal work of breathing. Neurologic: No focal deficits.   Lab Results  Component Value Date   CREATININE 0.92 06/16/2022   BUN 13 06/16/2022   NA 142 06/16/2022   K 4.8 06/16/2022   CL 106 06/16/2022   CO2 23 06/16/2022   Lab Results  Component Value Date   ALT 15 06/16/2022   AST 16 06/16/2022   ALKPHOS 81 06/16/2022   BILITOT <0.2 06/16/2022   Lab Results  Component Value Date   HGBA1C 6.0 (H) 06/16/2022   HGBA1C 5.8 (H) 02/16/2022   HGBA1C 5.8 (H) 10/13/2021   HGBA1C 6.0 (H) 06/11/2021   HGBA1C 6.1 (H) 02/10/2021   Lab Results  Component Value Date   INSULIN 15.4 09/04/2021   Lab Results  Component Value Date   TSH 2.200 09/04/2021   Lab Results  Component Value Date   CHOL 162 04/06/2022   HDL 93 04/06/2022   LDLCALC 60 04/06/2022   TRIG 38 04/06/2022   CHOLHDL 1.7 04/06/2022   Lab Results  Component Value Date   VD25OH 50.6 09/04/2021   VD25OH 21.9 (L) 10/07/2020   VD25OH 17.8 (L) 02/19/2020   Lab Results  Component Value Date   WBC 4.2 02/16/2022   HGB 12.2 02/16/2022   HCT 39.2 02/16/2022   MCV 70 (L) 02/16/2022   PLT 225 02/16/2022   Lab Results  Component Value Date   IRON 81 02/16/2022   TIBC 290 02/16/2022   FERRITIN 104 02/16/2022   Attestation Statements:   Reviewed by clinician on day of visit: allergies, medications, problem list,  medical history, surgical history, family history, social history, and previous encounter notes.   Wilhemena Durie, am acting as Location manager for [employer], DO.  I have reviewed the above documentation for accuracy and completeness, and I agree with the above. Jearld Lesch, DO

## 2022-09-23 ENCOUNTER — Encounter: Payer: Self-pay | Admitting: Bariatrics

## 2022-09-25 ENCOUNTER — Telehealth: Payer: Self-pay

## 2022-09-25 NOTE — Telephone Encounter (Signed)
VMT pt requesting call back reference next PREP class starting.

## 2022-09-29 NOTE — Progress Notes (Signed)
[facility] PREP Evaluation  Patient Details  Name: Stephanie French MRN: [id number] Date of Birth: 06-11-1956 Age: 66 y.o. PCP: Glendale Chard, MD  Vitals:   09/29/22 1730  BP: 118/80  Pulse: 64  SpO2: 99%  Weight: 207 lb 6.4 oz (94.1 kg)     [facility] Eval - 09/29/22 1800       [facility] "PREP" Location   [facility] "PREP" Location [facility]      Referral    Referring Provider Sanders    Reason for referral High Cholesterol;Diabetes    Program Start Date 10/06/22   T/TH 6p-715p x 12wks     Measurement   Waist Circumference 39.5 inches    Hip Circumference 43 inches    Body fat 45.8 percent      Information for Trainer   Goals Lose 15-20 lbs, feel better, get off meds, get rid of pain    Current Exercise walks all day at work 10-15k steps per day    Orthopedic Concerns Pain all over, r knee swells    Pertinent Medical History [employer] chol    Current Barriers none    Restrictions/Precautions --   none   Medications that affect exercise --   none     Timed Up and Go (TUGS)   Timed Up and Go Low risk <9 seconds      Mobility and Daily Activities   I have no trouble taking out the trash. 4    I do housework such as vacuuming and dusting on my own without difficulty. 4    I can easily lift a gallon of milk (8lbs). 4    I can easily walk a mile. 4    I have no trouble reaching into high cupboards or reaching down to pick up something from the floor. 4    I do not have trouble doing out-door work such as Armed forces logistics/support/administrative officer, raking leaves, or gardening. 2      Mobility and Daily Activities   I feel younger than my age. 1    I feel independent. 4    I feel energetic. 3    I live an active life.  3    I feel strong. 3    I feel healthy. 1    I feel active as other people my age. 4      How fit and strong are you.   Fit and Strong Total Score 41            Past Medical History:  Diagnosis Date   Anemia    Anxiety    Atypical chest pain 08/31/2019   Back pain     Constipation    Depression    Diabetes mellitus    Edema of both lower extremities    GERD (gastroesophageal reflux disease)    Hyperlipidemia    Joint pain    Obesity    Pure hypercholesterolemia 12/07/2019   Shortness of breath 08/31/2019   Vitamin D deficiency    Past Surgical History:  Procedure Laterality Date   OOPHORECTOMY Left 1983   left ovary removed   Social History   Tobacco Use  Smoking Status Former   Packs/day: 0.10   Years: 40.00   Total pack years: 4.00   Types: Cigarettes  Smokeless Tobacco Never    Barnett Hatter 09/29/2022, 6:52 PM

## 2022-10-16 ENCOUNTER — Other Ambulatory Visit: Payer: Self-pay | Admitting: Internal Medicine

## 2022-10-16 DIAGNOSIS — Z1231 Encounter for screening mammogram for malignant neoplasm of breast: Secondary | ICD-10-CM

## 2022-10-16 NOTE — Progress Notes (Signed)
[facility] PREP Weekly Session  Patient Details  Name: Stephanie French MRN: [id number] Date of Birth: 07-21-1956 Age: 66 y.o. PCP: Glendale Chard, MD  Vitals:   10/13/22 1800  Weight: 207 lb 12.8 oz (94.3 kg)     [facility] Weekly seesion - 10/16/22 1400       [facility] "PREP" Location   [facility] "PREP" Location [facility]      Weekly Session   Topic Discussed Importance of resistance training;Other ways to be active    Minutes exercised this week 55 minutes    Classes attended to date [place] 10/16/2022, 2:05 PM

## 2022-10-21 NOTE — Progress Notes (Signed)
[facility] PREP Weekly Session  Patient Details  Name: Stephanie French MRN: [id number] Date of Birth: Mar 08, 1956 Age: 66 y.o. PCP: Glendale Chard, MD  Vitals:   10/20/22 1800  Weight: 207 lb 6.4 oz (94.1 kg)     [facility] Weekly seesion - 10/21/22 1300       [facility] "PREP" Location   [facility] "PREP" Location [facility]      Weekly Session   Topic Discussed Healthy eating tips    Minutes exercised this week 300 minutes    Classes attended to date [place] 10/21/2022, 1:34 PM

## 2022-10-28 ENCOUNTER — Encounter: Payer: Self-pay | Admitting: Internal Medicine

## 2022-10-28 ENCOUNTER — Ambulatory Visit (INDEPENDENT_AMBULATORY_CARE_PROVIDER_SITE_OTHER): Payer: 59 | Admitting: Internal Medicine

## 2022-10-28 VITALS — BP 124/80 | HR 58 | Temp 98.4°F | Ht 64.0 in | Wt 207.0 lb

## 2022-10-28 DIAGNOSIS — Z0000 Encounter for general adult medical examination without abnormal findings: Secondary | ICD-10-CM | POA: Diagnosis not present

## 2022-10-28 DIAGNOSIS — E1169 Type 2 diabetes mellitus with other specified complication: Secondary | ICD-10-CM | POA: Diagnosis not present

## 2022-10-28 DIAGNOSIS — Z23 Encounter for immunization: Secondary | ICD-10-CM | POA: Diagnosis not present

## 2022-10-28 DIAGNOSIS — Z6835 Body mass index (BMI) 35.0-35.9, adult: Secondary | ICD-10-CM

## 2022-10-28 DIAGNOSIS — E119 Type 2 diabetes mellitus without complications: Secondary | ICD-10-CM | POA: Insufficient documentation

## 2022-10-28 DIAGNOSIS — E785 Hyperlipidemia, unspecified: Secondary | ICD-10-CM

## 2022-10-28 LAB — POCT URINALYSIS DIPSTICK
Bilirubin, UA: NEGATIVE
Glucose, UA: NEGATIVE
Ketones, UA: NEGATIVE
Leukocytes, UA: NEGATIVE
Nitrite, UA: NEGATIVE
Protein, UA: NEGATIVE
Spec Grav, UA: 1.01 (ref 1.010–1.025)
Urobilinogen, UA: 0.2 E.U./dL
pH, UA: 6.5 (ref 5.0–8.0)

## 2022-10-28 NOTE — Progress Notes (Signed)
Rich Brave [place],acting as a Education administrator for Maximino Greenland, MD.,have documented all relevant documentation on the behalf of Maximino Greenland, MD,as directed by  Maximino Greenland, MD while in the presence of Maximino Greenland, MD.   Subjective:     Patient ID: Stephanie French , female    DOB: 09-09-1956 , 66 y.o.   MRN: [id number]   Chief Complaint  Patient presents with   Annual Exam   Diabetes    HPI  She is here today for a full physical examination. She is not followed by GYN. She is due for pap smear in 2025. She reports compliance with meds. She denies having any headaches, chest pain and shortness of breath.  She has no other concerns at this time. She is currently participating in the PREP program and has found it to be quite useful.   Diabetes She presents for her follow-up diabetic visit. She has type 2 diabetes mellitus. Her disease course has been stable. There are no hypoglycemic associated symptoms. Pertinent negatives for diabetes include no blurred vision. There are no hypoglycemic complications. Risk factors for coronary artery disease include diabetes mellitus, dyslipidemia, obesity, post-menopausal and sedentary lifestyle. She participates in exercise intermittently. Eye exam is not current.     Past Medical History:  Diagnosis Date   Anemia    Anxiety    Atypical chest pain 08/31/2019   Back pain    Constipation    Depression    Diabetes mellitus    Edema of both lower extremities    GERD (gastroesophageal reflux disease)    Hyperlipidemia    Joint pain    Obesity    Pure hypercholesterolemia 12/07/2019   Shortness of breath 08/31/2019   Vitamin D deficiency      Family History  Problem Relation Age of Onset   Diabetes Mother    Hypertension Mother    Stroke Mother    Heart attack Mother    High Cholesterol Mother    Early death Father    Lung cancer Father    Diabetes Sister    Hypertension Sister    Breast cancer Maternal Grandmother    Heart  attack Maternal Grandmother    Cancer Other    Hypertension Other    Hyperlipidemia Other    Stroke Other    Heart attack Other      Current Outpatient Medications:    glucose blood test strip, Use as instructed to check blood sugar 3 times a day. Dx code e11.65, Disp: 100 each, Rfl: 2   hydrocortisone (ANUSOL-HC) 2.5 % rectal cream, Place 1 application rectally 2 (two) times daily as needed for hemorrhoids or anal itching., Disp: 30 g, Rfl: 0   Multiple Vitamin (MULTIVITAMIN) capsule, Take 1 capsule by mouth daily. For 50 plus, Disp: , Rfl:    pravastatin (PRAVACHOL) 40 MG tablet, Take 1 tablet (40 mg total) by mouth daily., Disp: 90 tablet, Rfl: 1   Semaglutide-Weight Management (WEGOVY) 2.4 MG/0.75ML SOAJ, Inject 2.4 mg into the skin once a week., Disp: 9 mL, Rfl: 3   Allergies  Allergen Reactions   Eggs Or Egg-Derived Products     Allergic to the protein in the egg white   Latex    Other Itching and Other (See Comments)    Other reaction(s): Unknown   Peanut-Containing Drug Products Other (See Comments)   Penicillins    Shellfish Allergy Hives, Itching, Photosensitivity and Rash   Shiitake Mushroom Hives, Itching, Photosensitivity and Rash  The patient states she uses post menopausal status for birth control. Last LMP was Patient's last menstrual period was 08/12/2012.. Negative for Dysmenorrhea. Negative for: breast discharge, breast lump(s), breast pain and breast self exam. Associated symptoms include abnormal vaginal bleeding. Pertinent negatives include abnormal bleeding (hematology), anxiety, decreased libido, depression, difficulty falling sleep, dyspareunia, history of infertility, nocturia, sexual dysfunction, sleep disturbances, urinary incontinence, urinary urgency, vaginal discharge and vaginal itching. Diet regular.The patient states her exercise level is  moderate, currently in PREP program.   . The patient's tobacco use is:  Social History   Tobacco Use   Smoking Status Former   Packs/day: 0.10   Years: 40.00   Total pack years: 4.00   Types: Cigarettes  Smokeless Tobacco Never  . She has been exposed to passive smoke. The patient's alcohol use is:  Social History   Substance and Sexual Activity  Alcohol Use No   Review of Systems  Constitutional: Negative.   HENT: Negative.    Eyes: Negative.  Negative for blurred vision.  Respiratory: Negative.    Cardiovascular: Negative.   Gastrointestinal: Negative.   Endocrine: Negative.   Genitourinary: Negative.   Musculoskeletal: Negative.   Skin: Negative.   Allergic/Immunologic: Negative.   Neurological: Negative.   Hematological: Negative.   Psychiatric/Behavioral: Negative.       Today's Vitals   10/28/22 0837  BP: 124/80  Pulse: (!) 58  Temp: 98.4 F (36.9 C)  Weight: 207 lb (93.9 kg)  Height: _0  (1.626 m)  PainSc: 0-No pain   Body mass index is 35.53 kg/m.  Wt Readings from Last 3 Encounters:  10/28/22 207 lb (93.9 kg)  10/20/22 207 lb 6.4 oz (94.1 kg)  10/13/22 207 lb 12.8 oz (94.3 kg)    Objective:  Physical Exam Vitals and nursing note reviewed.  Constitutional:      Appearance: Normal appearance.  HENT:     Head: Normocephalic and atraumatic.     Right Ear: Tympanic membrane, ear canal and external ear normal.     Left Ear: Tympanic membrane, ear canal and external ear normal.     Nose:     Comments: Masked     Mouth/Throat:     Comments: Masked  Eyes:     Extraocular Movements: Extraocular movements intact.     Conjunctiva/sclera: Conjunctivae normal.     Pupils: Pupils are equal, round, and reactive to light.  Cardiovascular:     Rate and Rhythm: Normal rate and regular rhythm.     Pulses: Normal pulses.          Dorsalis pedis pulses are 2+ on the right side and 2+ on the left side.     Heart sounds: Normal heart sounds.  Pulmonary:     Effort: Pulmonary effort is normal.     Breath sounds: Normal breath sounds.  Chest:  Breasts:     Tanner Score is 5.     Right: Normal.     Left: Normal.  Abdominal:     General: Bowel sounds are normal.     Palpations: Abdomen is soft.  Genitourinary:    Comments: deferred Musculoskeletal:        General: Normal range of motion.     Cervical back: Normal range of motion and neck supple.       Feet:  Feet:     Right foot:     Protective Sensation: 5 sites tested.  5 sites sensed.     Skin integrity: Skin integrity normal.  Toenail Condition: Right toenails are normal.     Left foot:     Protective Sensation: 5 sites tested.  5 sites sensed.     Skin integrity: Skin integrity normal.     Toenail Condition: Left toenails are normal.     Comments: B/l venous varicosities Skin:    General: Skin is warm and dry.  Neurological:     General: No focal deficit present.     Mental Status: She is alert and oriented to person, place, and time.  Psychiatric:        Mood and Affect: Mood normal.        Behavior: Behavior normal.      Assessment And Plan:     1. Encounter for general adult medical examination w/o abnormal findings Comments: A full exam was performed. Importance of monthly self breast exams was discussed with the patient.  She is UTD with CRC, breast and cervical CA screenings.  PATIENT IS ADVISED TO GET 30-45 MINUTES REGULAR EXERCISE NO LESS THAN FOUR TO FIVE DAYS PER WEEK - BOTH WEIGHTBEARING EXERCISES AND AEROBIC ARE RECOMMENDED.  PATIENT IS ADVISED TO FOLLOW A HEALTHY DIET WITH AT LEAST SIX FRUITS/VEGGIES PER DAY, DECREASE INTAKE OF RED MEAT, AND TO INCREASE FISH INTAKE TO TWO DAYS PER WEEK.  MEATS/FISH SHOULD NOT BE FRIED, BAKED OR BROILED IS PREFERABLE.  IT IS ALSO IMPORTANT TO CUT BACK ON YOUR SUGAR INTAKE. PLEASE AVOID ANYTHING WITH ADDED SUGAR, CORN SYRUP OR OTHER SWEETENERS. IF YOU MUST USE A SWEETENER, YOU CAN TRY STEVIA. IT IS ALSO IMPORTANT TO AVOID ARTIFICIALLY SWEETENERS AND DIET BEVERAGES. LASTLY, I SUGGEST WEARING SPF 50 SUNSCREEN ON EXPOSED PARTS AND  ESPECIALLY WHEN IN THE DIRECT SUNLIGHT FOR AN EXTENDED PERIOD OF TIME.  PLEASE AVOID FAST FOOD RESTAURANTS AND INCREASE YOUR WATER INTAKE.  - CBC - CMP14+EGFR - Hemoglobin A1c  2. Dyslipidemia associated with type 2 diabetes mellitus ([place]) Comments: Chronic, diabetic foot exam was performed. She agrees to rto in 4 months for re-evaluation.  I DISCUSSED WITH THE PATIENT AT LENGTH REGARDING THE GOALS OF GLYCEMIC CONTROL AND POSSIBLE LONG-TERM COMPLICATIONS.  I  ALSO STRESSED THE IMPORTANCE OF COMPLIANCE WITH HOME GLUCOSE MONITORING, DIETARY RESTRICTIONS INCLUDING AVOIDANCE OF SUGARY DRINKS/PROCESSED FOODS,  ALONG WITH REGULAR EXERCISE.  I  ALSO STRESSED THE IMPORTANCE OF ANNUAL EYE EXAMS, SELF FOOT CARE AND COMPLIANCE WITH OFFICE VISITS.  - POCT Urinalysis Dipstick (81002) - EKG 12-Lead - Urine microalbumin-creatinine with uACR  3. Class 2 severe obesity due to excess calories with serious comorbidity and body mass index (BMI) of 35.0 to 35.9 in adult [facility]) Comments: She is encouraged to aim for at least 150 minutes of exercise per week. She will continue with the PREP program.  4. Immunization due - Tdap vaccine greater than or equal to 7yo IM  Patient was given opportunity to ask questions. Patient verbalized understanding of the plan and was able to repeat key elements of the plan. All questions were answered to their satisfaction.   I, Maximino Greenland, MD, have reviewed all documentation for this visit. The documentation on 10/28/22 for the exam, diagnosis, procedures, and orders are all accurate and complete.   THE PATIENT IS ENCOURAGED TO PRACTICE SOCIAL DISTANCING DUE TO THE COVID-19 PANDEMIC.

## 2022-10-28 NOTE — Patient Instructions (Signed)

## 2022-10-30 LAB — CBC
Hematocrit: 38.2 % (ref 34.0–46.6)
Hemoglobin: 11.7 g/dL (ref 11.1–15.9)
MCH: 22 pg — ABNORMAL LOW (ref 26.6–33.0)
MCHC: 30.6 g/dL — ABNORMAL LOW (ref 31.5–35.7)
MCV: 72 fL — ABNORMAL LOW (ref 79–97)
Platelets: 207 10*3/uL (ref 150–450)
RBC: 5.33 x10E6/uL — ABNORMAL HIGH (ref 3.77–5.28)
RDW: 16.5 % — ABNORMAL HIGH (ref 11.7–15.4)
WBC: 3.6 10*3/uL (ref 3.4–10.8)

## 2022-10-30 LAB — CMP14+EGFR
ALT: 17 IU/L (ref 0–32)
AST: 21 IU/L (ref 0–40)
Albumin/Globulin Ratio: 1.6 (ref 1.2–2.2)
Albumin: 4 g/dL (ref 3.9–4.9)
Alkaline Phosphatase: 78 IU/L (ref 44–121)
BUN/Creatinine Ratio: 18 (ref 12–28)
BUN: 15 mg/dL (ref 8–27)
Bilirubin Total: 0.4 mg/dL (ref 0.0–1.2)
CO2: 21 mmol/L (ref 20–29)
Calcium: 9 mg/dL (ref 8.7–10.3)
Chloride: 103 mmol/L (ref 96–106)
Creatinine, Ser: 0.82 mg/dL (ref 0.57–1.00)
Globulin, Total: 2.5 g/dL (ref 1.5–4.5)
Glucose: 76 mg/dL (ref 70–99)
Potassium: 4.5 mmol/L (ref 3.5–5.2)
Sodium: 138 mmol/L (ref 134–144)
Total Protein: 6.5 g/dL (ref 6.0–8.5)
eGFR: 80 mL/min/{1.73_m2} (ref >59)

## 2022-10-30 LAB — HEMOGLOBIN A1C
Est. average glucose Bld gHb Est-mCnc: 117 mg/dL
Hgb A1c MFr Bld: 5.7 % — ABNORMAL HIGH (ref 4.8–5.6)

## 2022-10-30 LAB — MICROALBUMIN / CREATININE URINE RATIO
Creatinine, Urine: 30.9 mg/dL
Microalb/Creat Ratio: <10 mg/g creat (ref 0–29)
Microalbumin, Urine: <3 ug/mL

## 2022-11-02 ENCOUNTER — Encounter: Payer: Self-pay | Admitting: Bariatrics

## 2022-11-02 ENCOUNTER — Ambulatory Visit: Payer: 59 | Admitting: Bariatrics

## 2022-11-02 VITALS — BP 124/81 | HR 62 | Temp 97.8°F | Ht 64.0 in | Wt 203.0 lb

## 2022-11-02 DIAGNOSIS — E669 Obesity, unspecified: Secondary | ICD-10-CM | POA: Diagnosis not present

## 2022-11-02 DIAGNOSIS — E7800 Pure hypercholesterolemia, unspecified: Secondary | ICD-10-CM

## 2022-11-02 DIAGNOSIS — Z6834 Body mass index (BMI) 34.0-34.9, adult: Secondary | ICD-10-CM | POA: Diagnosis not present

## 2022-11-02 DIAGNOSIS — Z7985 Long-term (current) use of injectable non-insulin antidiabetic drugs: Secondary | ICD-10-CM

## 2022-11-02 DIAGNOSIS — E1169 Type 2 diabetes mellitus with other specified complication: Secondary | ICD-10-CM

## 2022-11-03 LAB — IRON AND TIBC
Iron Saturation: 32 % (ref 15–55)
Iron: 95 ug/dL (ref 27–139)
Total Iron Binding Capacity: 296 ug/dL (ref 250–450)
UIBC: 201 ug/dL (ref 118–369)

## 2022-11-03 LAB — SPECIMEN STATUS REPORT

## 2022-11-04 NOTE — Progress Notes (Signed)
[facility] PREP Weekly Session  Patient Details  Name: Stephanie French MRN: [id number] Date of Birth: 03/01/1956 Age: 66 y.o. PCP: Glendale Chard, MD  Vitals:   10/27/22 1800  Weight: 207 lb 6.4 oz (94.1 kg)     [facility] Weekly seesion - 11/04/22 1500       [facility] "PREP" Location   [facility] "PREP" Location [facility]      Weekly Session   Topic Discussed Health habits    Minutes exercised this week 240 minutes    Classes attended to date 5            Late entry Barnett Hatter 11/04/2022, 3:55 PM

## 2022-11-05 NOTE — Progress Notes (Signed)
[facility] PREP Weekly Session  Patient Details  Name: Stephanie French MRN: [id number] Date of Birth: Mar 13, 1956 Age: 66 y.o. PCP: Glendale Chard, MD  Vitals:   11/03/22 1800  Weight: 206 lb (93.4 kg)     [facility] Weekly seesion - 11/05/22 1100       [facility] "PREP" Location   [facility] "PREP" [facility]      Weekly Session   Topic Discussed Restaurant Eating   Salt demo   Minutes exercised this week 310 minutes    Classes attended to date [place] 11/05/2022, 11:29 AM

## 2022-11-11 NOTE — Progress Notes (Signed)
[facility] PREP Weekly Session  Patient Details  Name: Stephanie French MRN: [id number] Date of Birth: 09/24/1956 Age: 66 y.o. PCP: Glendale Chard, MD  Vitals:   11/10/22 1800  Weight: 204 lb 6.4 oz (92.7 kg)     [facility] Weekly seesion - 11/11/22 0800       [facility] "PREP" Location   [facility]PREP" [facility]      Weekly Session   Topic Discussed Eating for the season;Stress management and problem solving   reset goals   Minutes exercised this week 300 minutes    Classes attended to date [place] 11/11/2022, 8:56 AM

## 2022-11-13 LAB — HM DIABETES EYE EXAM

## 2022-11-16 ENCOUNTER — Encounter: Payer: Self-pay | Admitting: Bariatrics

## 2022-11-16 NOTE — Progress Notes (Signed)
Chief Complaint:   OBESITY Stephanie French is here to discuss her progress with her obesity treatment plan along with follow-up of her obesity related diagnoses. Stephanie French is on the Category 1 Plan and states she is following her eating plan approximately 40% of the time. Stephanie French states she is at the [facility] for 60 minutes 2 times per week.  Today's visit was #: 59 Starting weight: 213 lbs Starting date: 09/03/2021 Today's weight: 203 lbs Today's date: 11/02/2022 Total lbs lost to date: 10 Total lbs lost since last in-office visit: 0  Interim History: Stephanie French is up 1 pound since her last visit.  She is getting adequate water.  Subjective:   1. Diabetes mellitus type 2 in obese [facility]) Stephanie French is taking [place].  Her last A1c was 5.7.  2. Pure hypercholesterolemia Stephanie French is taking Pravachol as directed.  Assessment/Plan:   1. Diabetes mellitus type 2 in obese [facility]) Stephanie French will continue to lower her carbohydrates (minimize sweets and starches).  Protein shakes, corn, beans, and squash were discussed.  2. Pure hypercholesterolemia Stephanie French will continue Pravachol.  Handout on healthy fats versus unhealthy fats was given.   3. Obesity, current BMI 34.9 Stephanie French is currently in the action stage of change. As such, her goal is to continue with weight loss efforts. She has agreed to the Category 1 Plan.   She will adhere closely to the plan 80-90%.  Review labs with the patient from 10/28/2022, CMP, microalbumin creatinine, CBC, and A1c.  Exercise goals: As is.   Behavioral modification strategies: increasing lean protein intake, decreasing simple carbohydrates, increasing vegetables, increasing water intake, decreasing eating out, no skipping meals, meal planning and cooking strategies, keeping healthy foods in the home, and planning for success.  Stephanie French has agreed to follow-up with our clinic in 3 weeks. She was informed of the importance of frequent follow-up visits to maximize her success with intensive  lifestyle modifications for her multiple health conditions.   Objective:   Blood pressure 124/81, pulse 62, temperature 97.8 F (36.6 C), height '5\' 4"'$  (1.626 m), weight 203 lb (92.1 kg), last menstrual period 08/12/2012, SpO2 99 %. Body mass index is 34.84 kg/m.  General: Cooperative, alert, well developed, in no acute distress. HEENT: Conjunctivae and lids unremarkable. Cardiovascular: Regular rhythm.  Lungs: Normal work of breathing. Neurologic: No focal deficits.   Lab Results  Component Value Date   CREATININE 0.82 10/28/2022   BUN 15 10/28/2022   NA 138 10/28/2022   K 4.5 10/28/2022   CL 103 10/28/2022   CO2 21 10/28/2022   Lab Results  Component Value Date   ALT 17 10/28/2022   AST 21 10/28/2022   ALKPHOS 78 10/28/2022   BILITOT 0.4 10/28/2022   Lab Results  Component Value Date   HGBA1C 5.7 (H) 10/28/2022   HGBA1C 6.0 (H) 06/16/2022   HGBA1C 5.8 (H) 02/16/2022   HGBA1C 5.8 (H) 10/13/2021   HGBA1C 6.0 (H) 06/11/2021   Lab Results  Component Value Date   INSULIN 15.4 09/04/2021   Lab Results  Component Value Date   TSH 2.200 09/04/2021   Lab Results  Component Value Date   CHOL 162 04/06/2022   HDL 93 04/06/2022   LDLCALC 60 04/06/2022   TRIG 38 04/06/2022   CHOLHDL 1.7 04/06/2022   Lab Results  Component Value Date   VD25OH 50.6 09/04/2021   VD25OH 21.9 (L) 10/07/2020   VD25OH 17.8 (L) 02/19/2020   Lab Results  Component Value Date   WBC 3.6  10/28/2022   HGB 11.7 10/28/2022   HCT 38.2 10/28/2022   MCV 72 (L) 10/28/2022   PLT 207 10/28/2022   Lab Results  Component Value Date   IRON 95 10/28/2022   TIBC 296 10/28/2022   FERRITIN 104 02/16/2022   Attestation Statements:   Reviewed by clinician on day of visit: allergies, medications, problem list, medical history, surgical history, family history, social history, and previous encounter notes.   Wilhemena Durie, am acting as Location manager for [employer], DO.  I have reviewed  the above documentation for accuracy and completeness, and I agree with the above. Jearld Lesch, DO

## 2022-11-19 NOTE — Progress Notes (Signed)
[facility] PREP Weekly Session  Patient Details  Name: Stephanie French MRN: [id number] Date of Birth: [birth year] Age: 66 y.o. PCP: Glendale Chard, MD  Vitals:   11/17/22 1800  Weight: 203 lb 6.4 oz (92.3 kg)     [facility] Weekly seesion - 11/19/22 1700       [facility] "PREP" Location   [facility]      Weekly Session   Topic Discussed Expectations and non-scale victories    Minutes exercised this week 210 minutes    Classes attended to date [place] 11/19/2022, 5:15 PM

## 2022-11-25 NOTE — Progress Notes (Signed)
[facility] PREP Weekly Session  Patient Details  Name: Stephanie French MRN: [id number] Date of Birth: Sep 08, 1956 Age: 66 y.o. PCP: Glendale Chard, MD  Vitals:   11/24/22 1800  Weight: 206 lb 3.2 oz (93.5 kg)     [facility] Weekly seesion - 11/25/22 1200       [facility] "PREP" Location   [facility]PREP[facility] Location [facility]      Weekly Session   Topic Discussed --   portions   Minutes exercised this week 210 minutes    Classes attended to date 12             Barnett Hatter 11/25/2022, 12:10 PM

## 2022-11-30 ENCOUNTER — Ambulatory Visit: Payer: 59 | Admitting: Bariatrics

## 2022-11-30 ENCOUNTER — Encounter: Payer: Self-pay | Admitting: Bariatrics

## 2022-11-30 VITALS — BP 134/85 | HR 63 | Temp 97.8°F | Ht 64.0 in | Wt 203.0 lb

## 2022-11-30 DIAGNOSIS — E1169 Type 2 diabetes mellitus with other specified complication: Secondary | ICD-10-CM

## 2022-11-30 DIAGNOSIS — E669 Obesity, unspecified: Secondary | ICD-10-CM

## 2022-11-30 DIAGNOSIS — E7800 Pure hypercholesterolemia, unspecified: Secondary | ICD-10-CM | POA: Diagnosis not present

## 2022-11-30 DIAGNOSIS — Z6834 Body mass index (BMI) 34.0-34.9, adult: Secondary | ICD-10-CM

## 2022-12-02 NOTE — Progress Notes (Signed)
[facility] PREP Weekly Session  Patient Details  Name: INOCENCIA MURTAUGH MRN: [id number] Date of Birth: 1956/02/15 Age: 66 y.o. PCP: Glendale Chard, MD  Vitals:   12/01/22 1800  Weight: 207 lb 1.6 oz (93.9 kg)     [facility] Weekly seesion - 12/02/22 0900       [facility] "PREP" Location   [facility] "PREP" Location [facility]      Weekly Session   Topic Discussed Finding support    Minutes exercised this week 370 minutes    Classes attended to date 46             [place] 12/02/2022, 9:07 AM

## 2022-12-09 ENCOUNTER — Ambulatory Visit
Admission: RE | Admit: 2022-12-09 | Discharge: 2022-12-09 | Disposition: A | Discharge status: Home / Self Care | Payer: No Typology Code available for payment source | Source: Ambulatory Visit | Attending: Internal Medicine | Admitting: Internal Medicine

## 2022-12-09 DIAGNOSIS — Z1231 Encounter for screening mammogram for malignant neoplasm of breast: Secondary | ICD-10-CM

## 2022-12-09 NOTE — Progress Notes (Signed)
[facility] PREP Weekly Session  Patient Details  Name: Stephanie French MRN: [id number] Date of Birth: 05/25/1956 Age: 66 y.o. PCP: Glendale Chard, MD  Vitals:   12/08/22 1800  Weight: 205 lb (93 kg)     [facility] Weekly seesion - 12/09/22 1500       [facility] "PREP" Location   [facility] "PREP" Location [facility]      Weekly Session   Topic Discussed Calorie breakdown    Minutes exercised this week 210 minutes    Classes attended to date 22             [place] 12/09/2022, 3:56 PM

## 2022-12-15 ENCOUNTER — Encounter: Payer: Self-pay | Admitting: Bariatrics

## 2022-12-15 NOTE — Progress Notes (Signed)
Chief Complaint:   OBESITY Stephanie French is here to discuss her progress with her obesity treatment plan along with follow-up of her obesity related diagnoses. Stephanie French is on the Category 1 Plan and states she is following her eating plan approximately 40% of the time. Stephanie French states she is doing an exercise class for 60 minutes 2 times per week.  Today's visit was #: 68 Starting weight: 213 lbs Starting date: 09/03/2021 Today's weight: 203 lbs Today's date: 11/30/2022 Total lbs lost to date: 10 Total lbs lost since last in-office visit: 0  Interim History: Stephanie French's weight remains the same.  Her appetite is controlled.  Subjective:   1. Pure hypercholesterolemia Stephanie French is taking Pravachol.  2. Diabetes mellitus type 2 in obese Stephanie French has been prescribed Wegovy in the past.  Assessment/Plan:   1. Pure hypercholesterolemia Stephanie French will continue Pravachol as directed.  2. Diabetes mellitus type 2 in obese Stephanie French will continue her meal plan, and add a protein shake.  3. Obesity, current BMI 34.9 Stephanie French is currently in the action stage of change. As such, her goal is to continue with weight loss efforts. She has agreed to the Category 1 Plan.   Meal planning and intentional eating were discussed.  She will adhere to the plan 80-90%.  Increase protein, fruits, and vegetables.  Exercise goals: As is.   Behavioral modification strategies: increasing lean protein intake, decreasing simple carbohydrates, increasing vegetables, increasing water intake, decreasing eating out, no skipping meals, meal planning and cooking strategies, keeping healthy foods in the home, and planning for success.  Stephanie French has agreed to follow-up with our clinic in 4 weeks. She was informed of the importance of frequent follow-up visits to maximize her success with intensive lifestyle modifications for her multiple health conditions.   Objective:   Blood pressure 134/85, pulse 63, temperature 97.8 F (36.6  C), height '5\' 4"'$  (1.626 m), weight 203 lb (92.1 kg), last menstrual period 08/12/2012, SpO2 100 %. Body mass index is 34.84 kg/m.  General: Cooperative, alert, well developed, in no acute distress. HEENT: Conjunctivae and lids unremarkable. Cardiovascular: Regular rhythm.  Lungs: Normal work of breathing. Neurologic: No focal deficits.   Lab Results  Component Value Date   CREATININE 0.82 10/28/2022   BUN 15 10/28/2022   NA 138 10/28/2022   K 4.5 10/28/2022   CL 103 10/28/2022   CO2 21 10/28/2022   Lab Results  Component Value Date   ALT 17 10/28/2022   AST 21 10/28/2022   ALKPHOS 78 10/28/2022   BILITOT 0.4 10/28/2022   Lab Results  Component Value Date   HGBA1C 5.7 (H) 10/28/2022   HGBA1C 6.0 (H) 06/16/2022   HGBA1C 5.8 (H) 02/16/2022   HGBA1C 5.8 (H) 10/13/2021   HGBA1C 6.0 (H) 06/11/2021   Lab Results  Component Value Date   INSULIN 15.4 09/04/2021   Lab Results  Component Value Date   TSH 2.200 09/04/2021   Lab Results  Component Value Date   CHOL 162 04/06/2022   HDL 93 04/06/2022   LDLCALC 60 04/06/2022   TRIG 38 04/06/2022   CHOLHDL 1.7 04/06/2022   Lab Results  Component Value Date   VD25OH 50.6 09/04/2021   VD25OH 21.9 (L) 10/07/2020   VD25OH 17.8 (L) 02/19/2020   Lab Results  Component Value Date   WBC 3.6 10/28/2022   HGB 11.7 10/28/2022   HCT 38.2 10/28/2022   MCV 72 (L) 10/28/2022   PLT 207 10/28/2022   Lab Results  Component Value Date   IRON 95 10/28/2022   TIBC 296 10/28/2022   FERRITIN 104 02/16/2022   Attestation Statements:   Reviewed by clinician on day of visit: allergies, medications, problem list, medical history, surgical history, family history, social history, and previous encounter notes.   Wilhemena Durie, am acting as Location manager for [employer], DO.  I have reviewed the above documentation for accuracy and completeness, and I agree with the above. Jearld Lesch, DO

## 2022-12-16 NOTE — Progress Notes (Signed)
[facility] PREP Weekly Session  Patient Details  Name: Stephanie French MRN: [id number] Date of Birth: 1956/11/05 Age: 66 y.o. PCP: Glendale Chard, MD  Vitals:   12/15/22 1800  Weight: 206 lb 11.2 oz (93.8 kg)     [facility] Weekly seesion - 12/16/22 1500       [facility] "PREP" Location   [facility] "PREP" Location [facility]      Weekly Session   Topic Discussed Hitting roadblocks    Minutes exercised this week 255 minutes    Classes attended to date 18             Barnett Hatter 12/16/2022, 3:37 PM

## 2022-12-28 ENCOUNTER — Encounter: Payer: Self-pay | Admitting: Bariatrics

## 2022-12-28 ENCOUNTER — Ambulatory Visit: Payer: No Typology Code available for payment source | Admitting: Bariatrics

## 2022-12-28 VITALS — BP 123/73 | HR 62 | Temp 97.7°F | Ht 64.0 in | Wt 200.0 lb

## 2022-12-28 DIAGNOSIS — E7800 Pure hypercholesterolemia, unspecified: Secondary | ICD-10-CM

## 2022-12-28 DIAGNOSIS — Z7985 Long-term (current) use of injectable non-insulin antidiabetic drugs: Secondary | ICD-10-CM

## 2022-12-28 DIAGNOSIS — E1169 Type 2 diabetes mellitus with other specified complication: Secondary | ICD-10-CM

## 2022-12-28 DIAGNOSIS — Z6834 Body mass index (BMI) 34.0-34.9, adult: Secondary | ICD-10-CM | POA: Diagnosis not present

## 2022-12-28 DIAGNOSIS — E669 Obesity, unspecified: Secondary | ICD-10-CM

## 2022-12-28 DIAGNOSIS — E66812 Morbid (severe) obesity due to excess calories: Secondary | ICD-10-CM

## 2023-01-07 NOTE — Progress Notes (Signed)
Chief Complaint:   OBESITY Stephanie French is here to discuss her progress with her obesity treatment plan along with follow-up of her obesity related diagnoses. Stephanie French is on the Category 1 Plan and states she is following her eating plan approximately 25% of the time. Stephanie French states she is walking, weights, and 10,000 steps 60-90 minutes 3 times per week.  Today's visit was #: 18 Starting weight: 213 lbs Starting date: 09/03/2021 Today's weight: 200 lbs Today's date: 12/28/2022 Total lbs lost to date: 13 Total lbs lost since last in-office visit: 3  Interim History: Stephanie French is down 3 lbs since her last visit. She is exercising and has increased her protein intake.  Subjective:   1. Pure hypercholesterolemia Stephanie French is taking Pravachol.  2. Diabetes mellitus type 2 in obese [facility]) She is taking Wegovy weekly.  Assessment/Plan:   1. Pure hypercholesterolemia Continue Pravachol as prescribed.  2. Diabetes mellitus type 2 in obese ([place]) Continue Wegovy. Increase water intake. Take Metamucil and Citracal sugar free  3. Obesity, current BMI 34.4 Stephanie French is currently in the action stage of change. As such, her goal is to continue with weight loss efforts. She has agreed to keeping a food journal and adhering to recommended goals of 1000 calories and 80 grams protein.   Continue to increase vegetables, fruits, water, and protein. Meal planning discussed.  Exercise goals:  As is  Behavioral modification strategies: increasing lean protein intake, decreasing simple carbohydrates, increasing vegetables, increasing water intake, decreasing eating out, no skipping meals, meal planning and cooking strategies, keeping healthy foods in the home, and planning for success.  Stephanie French has agreed to follow-up with our clinic in 4 weeks. She was informed of the importance of frequent follow-up visits to maximize her success with intensive lifestyle modifications for her multiple health conditions.   Objective:    Blood pressure 123/73, pulse 62, temperature 97.7 F (36.5 C), height '5\' 4"'$  (1.626 m), weight 200 lb (90.7 kg), last menstrual period 08/12/2012, SpO2 99 %. Body mass index is 34.33 kg/m.  General: Cooperative, alert, well developed, in no acute distress. HEENT: Conjunctivae and lids unremarkable. Cardiovascular: Regular rhythm.  Lungs: Normal work of breathing. Neurologic: No focal deficits.   Lab Results  Component Value Date   CREATININE 0.82 10/28/2022   BUN 15 10/28/2022   NA 138 10/28/2022   K 4.5 10/28/2022   CL 103 10/28/2022   CO2 21 10/28/2022   Lab Results  Component Value Date   ALT 17 10/28/2022   AST 21 10/28/2022   ALKPHOS 78 10/28/2022   BILITOT 0.4 10/28/2022   Lab Results  Component Value Date   HGBA1C 5.7 (H) 10/28/2022   HGBA1C 6.0 (H) 06/16/2022   HGBA1C 5.8 (H) 02/16/2022   HGBA1C 5.8 (H) 10/13/2021   HGBA1C 6.0 (H) 06/11/2021   Lab Results  Component Value Date   INSULIN 15.4 09/04/2021   Lab Results  Component Value Date   TSH 2.200 09/04/2021   Lab Results  Component Value Date   CHOL 162 04/06/2022   HDL 93 04/06/2022   LDLCALC 60 04/06/2022   TRIG 38 04/06/2022   CHOLHDL 1.7 04/06/2022   Lab Results  Component Value Date   VD25OH 50.6 09/04/2021   VD25OH 21.9 (L) 10/07/2020   VD25OH 17.8 (L) 02/19/2020   Lab Results  Component Value Date   WBC 3.6 10/28/2022   HGB 11.7 10/28/2022   HCT 38.2 10/28/2022   MCV 72 (L) 10/28/2022   PLT 207 10/28/2022  Lab Results  Component Value Date   IRON 95 10/28/2022   TIBC 296 10/28/2022   FERRITIN 104 02/16/2022    Attestation Statements:   Reviewed by clinician on day of visit: allergies, medications, problem list, medical history, surgical history, family history, social history, and previous encounter notes.  I, Kathlene November, BS, CMA, am acting as transcriptionist for [employer], DO.  I have reviewed the above documentation for accuracy and completeness, and I agree  with the above. Jearld Lesch, DO

## 2023-01-21 NOTE — Progress Notes (Signed)
[facility] PREP Evaluation  Patient Details  Name: Stephanie French MRN: [id number] Date of Birth: March 20, 1956 Age: 67 y.o. PCP: Glendale Chard, MD  Vitals:   01/12/23 1830  BP: 116/70  Pulse: 68  SpO2: 98%  Weight: 204 lb (92.5 kg)     [facility] Eval - 01/21/23 1600       [facility] "PREP" Location   [facility] "PREP" Location [facility]      Referral    Referring Provider [employer] Start Date --   PREP end date 01/12/23     Measurement   Waist Circumference 39.5 inches    Hip Circumference 41.5 inches    Body fat 46.2 percent      Information for Trainer   Goals reset      Mobility and Daily Activities   I find it easy to walk up or down two or more flights of stairs. 3    I have no trouble taking out the trash. 4    I do housework such as vacuuming and dusting on my own without difficulty. 4    I can easily lift a gallon of milk (8lbs). 4    I can easily walk a mile. 4    I have no trouble reaching into high cupboards or reaching down to pick up something from the floor. 3    I do not have trouble doing out-door work such as Armed forces logistics/support/administrative officer, raking leaves, or gardening. 3      Mobility and Daily Activities   I feel younger than my age. 4    I feel independent. 4    I feel energetic. 3    I live an active life.  3    I feel strong. 2    I feel healthy. 3    I feel active as other people my age. 3      How fit and strong are you.   Fit and Strong Total Score 47            Past Medical History:  Diagnosis Date   Anemia    Anxiety    Atypical chest pain 08/31/2019   Back pain    Constipation    Depression    Diabetes mellitus    Edema of both lower extremities    GERD (gastroesophageal reflux disease)    Hyperlipidemia    Joint pain    Obesity    Pure hypercholesterolemia 12/07/2019   Shortness of breath 08/31/2019   Vitamin D deficiency    Past Surgical History:  Procedure Laterality Date   OOPHORECTOMY Left 1983   left ovary removed   Social  History   Tobacco Use  Smoking Status Former   Packs/day: 0.10   Years: 40.00   Total pack years: 4.00   Types: Cigarettes  Smokeless Tobacco Never  Attended 21 sessions, 12 of 12 educational sessions Fit testing: Cardio march: 269 to 279 Sit to stand: 8 to 18 Bicep curl: 19 to 26 Better balance, encouraged to continue to exercise for health and manage stress   Barnett Hatter 01/21/2023, 4:45 PM

## 2023-01-25 ENCOUNTER — Encounter: Payer: Self-pay | Admitting: Bariatrics

## 2023-01-25 ENCOUNTER — Ambulatory Visit: Payer: No Typology Code available for payment source | Admitting: Bariatrics

## 2023-01-25 VITALS — BP 127/62 | HR 62 | Temp 97.8°F | Ht 64.0 in | Wt 198.0 lb

## 2023-01-25 DIAGNOSIS — E1165 Type 2 diabetes mellitus with hyperglycemia: Secondary | ICD-10-CM | POA: Diagnosis not present

## 2023-01-25 DIAGNOSIS — Z7985 Long-term (current) use of injectable non-insulin antidiabetic drugs: Secondary | ICD-10-CM

## 2023-01-25 DIAGNOSIS — E7800 Pure hypercholesterolemia, unspecified: Secondary | ICD-10-CM | POA: Diagnosis not present

## 2023-01-25 DIAGNOSIS — E669 Obesity, unspecified: Secondary | ICD-10-CM | POA: Insufficient documentation

## 2023-01-25 DIAGNOSIS — Z6834 Body mass index (BMI) 34.0-34.9, adult: Secondary | ICD-10-CM | POA: Diagnosis not present

## 2023-01-31 ENCOUNTER — Other Ambulatory Visit: Payer: Self-pay | Admitting: Internal Medicine

## 2023-02-06 NOTE — Progress Notes (Unsigned)
Chief Complaint:   OBESITY Stephanie French is here to discuss her progress with her obesity treatment plan along with follow-up of her obesity related diagnoses. Stephanie French is on keeping a food journal and adhering to recommended goals of 1000 calories and 80 grams protein and states she is following her eating plan approximately 30% of the time. Stephanie French states she is walking, strength training, and resistance training 90 minutes 3 times per week.  Today's visit was #: 64 Starting weight: 213 lbs Starting date: 09/03/2021 Today's weight: 198 lbs Today's date: 01/25/2023 Total lbs lost to date: 15 Total lbs lost since last in-office visit: 2  Interim History: Stephanie French is down an additional 2 lbs since her last visit. She is doing better with her protein and vegetables.  Subjective:   1. Uncontrolled type 2 diabetes mellitus with hyperglycemia ([place]) Stephanie French is taking Mancel Parsons and has had some constipation.  2. Pure hypercholesterolemia Stephanie French is taking pravastatin.  Assessment/Plan:   1. Uncontrolled type 2 diabetes mellitus with hyperglycemia ([place]) Will take OTC Metamucil and Miralax. Increase water and fiber. Check to see coverage for [facility].  2. Pure hypercholesterolemia Continue medication.  3. Generalized obesity 4. BMI 34.0-34.9,adult Meal planning Will adhere closely to the plan. Will keep her vegetables and protein high.  Stephanie French is currently in the action stage of change. As such, her goal is to continue with weight loss efforts. She has agreed to the Category 1 Plan.   Exercise goals:  She has enrolled in the [facility].  Behavioral modification strategies: increasing lean protein intake, decreasing simple carbohydrates, increasing vegetables, increasing water intake, decreasing eating out, no skipping meals, meal planning and cooking strategies, keeping healthy foods in the home, and planning for success.  Stephanie French has agreed to follow-up with our clinic in 4 weeks. She was informed of the  importance of frequent follow-up visits to maximize her success with intensive lifestyle modifications for her multiple health conditions.   Objective:   Blood pressure 127/62, pulse 62, temperature 97.8 F (36.6 C), height 5' 4"$  (1.626 m), weight 198 lb (89.8 kg), last menstrual period 08/12/2012, SpO2 95 %. Body mass index is 33.99 kg/m.  General: Cooperative, alert, well developed, in no acute distress. HEENT: Conjunctivae and lids unremarkable. Cardiovascular: Regular rhythm.  Lungs: Normal work of breathing. Neurologic: No focal deficits.   Lab Results  Component Value Date   CREATININE 0.82 10/28/2022   BUN 15 10/28/2022   NA 138 10/28/2022   K 4.5 10/28/2022   CL 103 10/28/2022   CO2 21 10/28/2022   Lab Results  Component Value Date   ALT 17 10/28/2022   AST 21 10/28/2022   ALKPHOS 78 10/28/2022   BILITOT 0.4 10/28/2022   Lab Results  Component Value Date   HGBA1C 5.7 (H) 10/28/2022   HGBA1C 6.0 (H) 06/16/2022   HGBA1C 5.8 (H) 02/16/2022   HGBA1C 5.8 (H) 10/13/2021   HGBA1C 6.0 (H) 06/11/2021   Lab Results  Component Value Date   INSULIN 15.4 09/04/2021   Lab Results  Component Value Date   TSH 2.200 09/04/2021   Lab Results  Component Value Date   CHOL 162 04/06/2022   HDL 93 04/06/2022   LDLCALC 60 04/06/2022   TRIG 38 04/06/2022   CHOLHDL 1.7 04/06/2022   Lab Results  Component Value Date   VD25OH 50.6 09/04/2021   VD25OH 21.9 (L) 10/07/2020   VD25OH 17.8 (L) 02/19/2020   Lab Results  Component Value Date   WBC 3.6 10/28/2022  HGB 11.7 10/28/2022   HCT 38.2 10/28/2022   MCV 72 (L) 10/28/2022   PLT 207 10/28/2022   Lab Results  Component Value Date   IRON 95 10/28/2022   TIBC 296 10/28/2022   FERRITIN 104 02/16/2022   Attestation Statements:   Reviewed by clinician on day of visit: allergies, medications, problem list, medical history, surgical history, family history, social history, and previous encounter notes.  I, Kathlene November, BS, CMA, am acting as transcriptionist for [employer], DO.  I have reviewed the above documentation for accuracy and completeness, and I agree with the above. Jearld Lesch, DO

## 2023-02-09 ENCOUNTER — Encounter: Payer: Self-pay | Admitting: Bariatrics

## 2023-03-01 ENCOUNTER — Encounter: Payer: Self-pay | Admitting: Internal Medicine

## 2023-03-01 ENCOUNTER — Ambulatory Visit (INDEPENDENT_AMBULATORY_CARE_PROVIDER_SITE_OTHER): Payer: No Typology Code available for payment source | Admitting: Internal Medicine

## 2023-03-01 VITALS — BP 126/70 | HR 73 | Temp 98.3°F | Ht 64.0 in | Wt 202.6 lb

## 2023-03-01 DIAGNOSIS — E2839 Other primary ovarian failure: Secondary | ICD-10-CM

## 2023-03-01 DIAGNOSIS — Z23 Encounter for immunization: Secondary | ICD-10-CM | POA: Diagnosis not present

## 2023-03-01 DIAGNOSIS — E1169 Type 2 diabetes mellitus with other specified complication: Secondary | ICD-10-CM

## 2023-03-01 DIAGNOSIS — Z6834 Body mass index (BMI) 34.0-34.9, adult: Secondary | ICD-10-CM

## 2023-03-01 DIAGNOSIS — E6609 Other obesity due to excess calories: Secondary | ICD-10-CM | POA: Diagnosis not present

## 2023-03-01 DIAGNOSIS — E785 Hyperlipidemia, unspecified: Secondary | ICD-10-CM | POA: Diagnosis not present

## 2023-03-01 MED ORDER — ZEPBOUND 5 MG/0.5ML ~~LOC~~ SOAJ: 5.0000 mg | SUBCUTANEOUS | 0 refills | Status: DC

## 2023-03-01 NOTE — Progress Notes (Signed)
Barnet Glasgow Martin,acting as a Education administrator for Maximino Greenland, MD.,have documented all relevant documentation on the behalf of Maximino Greenland, MD,as directed by  Maximino Greenland, MD while in the presence of Maximino Greenland, MD.    Subjective:     Patient ID: Stephanie French , female    DOB: [birth year] , 67 y.o.   MRN: [id number]   Chief Complaint  Patient presents with   Diabetes   Hyperlipidemia    HPI  She presents today for DM check. She reports compliance with meds. She is currently on Semaglutide 2.4mg  weekly. She denies headaches, chest pain and shortness of breath. She is also followed at [facility] clinic.   BP Readings from Last 3 Encounters: 03/01/23 : 126/70 01/25/23 : 127/62 01/12/23 : 116/70    Diabetes She presents for her follow-up diabetic visit. She has type 2 diabetes mellitus. There are no hypoglycemic associated symptoms. Pertinent negatives for diabetes include no blurred vision, no chest pain, no polydipsia, no polyphagia and no polyuria. There are no hypoglycemic complications. Symptoms are stable. Risk factors for coronary artery disease include diabetes mellitus, obesity and post-menopausal. Her breakfast blood glucose is taken between 8-9 am. Her breakfast blood glucose range is generally 90-110 mg/dl. An ACE inhibitor/angiotensin II receptor blocker is not being taken.  Hyperlipidemia Pertinent negatives include no chest pain.     Past Medical History:  Diagnosis Date   Anemia    Anxiety    Atypical chest pain 08/31/2019   Back pain    Constipation    Depression    Diabetes mellitus    Edema of both lower extremities    GERD (gastroesophageal reflux disease)    Hyperlipidemia    Joint pain    Obesity    Pure hypercholesterolemia 12/07/2019   Shortness of breath 08/31/2019   Vitamin D deficiency      Family History  Problem Relation Age of Onset   Diabetes Mother    Hypertension Mother    Stroke Mother    Heart attack Mother    High Cholesterol  Mother    Early death Father    Lung cancer Father    Diabetes Sister    Hypertension Sister    Breast cancer Maternal Grandmother    Heart attack Maternal Grandmother    Cancer Other    Hypertension Other    Hyperlipidemia Other    Stroke Other    Heart attack Other      Current Outpatient Medications:    glucose blood test strip, Use as instructed to check blood sugar 3 times a day. Dx code e11.65, Disp: 100 each, Rfl: 2   hydrocortisone (ANUSOL-HC) 2.5 % rectal cream, Place 1 application rectally 2 (two) times daily as needed for hemorrhoids or anal itching., Disp: 30 g, Rfl: 0   Multiple Vitamin (MULTIVITAMIN) capsule, Take 1 capsule by mouth daily. For 50 plus, Disp: , Rfl:    pravastatin (PRAVACHOL) 40 MG tablet, Take 1 tablet (40 mg total) by mouth daily., Disp: 90 tablet, Rfl: 1   tirzepatide (ZEPBOUND) 5 MG/0.5ML Pen, Inject 5 mg into the skin once a week., Disp: 2 mL, Rfl: 0   Allergies  Allergen Reactions   Egg-Derived Products     Allergic to the protein in the egg white   Latex    Other Itching and Other (See Comments)    Other reaction(s): Unknown   Peanut-Containing Drug Products Other (See Comments)   Penicillins    Shellfish Allergy  Hives, Itching, Photosensitivity and Rash   Shiitake Mushroom Hives, Itching, Photosensitivity and Rash     Review of Systems  Constitutional: Negative.   Eyes:  Negative for blurred vision.  Respiratory: Negative.    Cardiovascular: Negative.  Negative for chest pain.  Gastrointestinal: Negative.   Endocrine: Negative for polydipsia, polyphagia and polyuria.  Musculoskeletal: Negative.   Skin: Negative.   Neurological: Negative.   Psychiatric/Behavioral: Negative.       Today's Vitals   03/01/23 0830  BP: 126/70  Pulse: 73  Temp: 98.3 F (36.8 C)  TempSrc: Oral  Weight: 202 lb 9.6 oz (91.9 kg)  Height: 5\' 4"  (1.626 m)  PainSc: 0-No pain   Body mass index is 34.78 kg/m.  Wt Readings from Last 3 Encounters:   03/01/23 202 lb 9.6 oz (91.9 kg)  01/25/23 198 lb (89.8 kg)  01/12/23 204 lb (92.5 kg)    Objective:  Physical Exam Vitals and nursing note reviewed.  Constitutional:      Appearance: Normal appearance. She is obese.  HENT:     Head: Normocephalic and atraumatic.     Nose:     Comments: Masked     Mouth/Throat:     Comments: Masked  Eyes:     Extraocular Movements: Extraocular movements intact.  Cardiovascular:     Rate and Rhythm: Normal rate and regular rhythm.     Heart sounds: Normal heart sounds.  Pulmonary:     Effort: Pulmonary effort is normal.     Breath sounds: Normal breath sounds.  Musculoskeletal:     Cervical back: Normal range of motion.  Skin:    General: Skin is warm.  Neurological:     General: No focal deficit present.     Mental Status: She is alert.  Psychiatric:        Mood and Affect: Mood normal.        Behavior: Behavior normal.      Assessment And Plan:     1. Dyslipidemia associated with type 2 diabetes mellitus ([place]) Comments: Chronic, I will check labs as below.  She states bariatric specialist wants her to switch her to [facility]. - Hemoglobin A1c - Lipid panel - CMP14+EGFR  2. Estrogen deficiency Comments: She is agreeable to getting a bone density. I will refer her to the [place]; Future  3. Class 1 obesity due to excess calories with serious comorbidity and body mass index (BMI) of 34.0 to 34.9 in adult Comments: She is encouraged to aim for at least 150 minutes of exercise/week. She will c/w [facility] clinic as well. will send rx Zepbound 5mg  weekly.  4. Immunization due - Pneumococcal conjugate vaccine 20-valent (Prevnar-20)   Patient was given opportunity to ask questions. Patient verbalized understanding of the plan and was able to repeat key elements of the plan. All questions were answered to their satisfaction.   I, Maximino Greenland, MD, have reviewed all documentation for this visit. The documentation  on 03/01/23 for the exam, diagnosis, procedures, and orders are all accurate and complete.   IF YOU HAVE BEEN REFERRED TO A SPECIALIST, IT MAY TAKE 1-2 WEEKS TO SCHEDULE/PROCESS THE REFERRAL. IF YOU HAVE NOT HEARD FROM US/SPECIALIST IN TWO WEEKS, PLEASE GIVE [place] A CALL AT [phone].   THE PATIENT IS ENCOURAGED TO PRACTICE SOCIAL DISTANCING DUE TO THE COVID-19 PANDEMIC.

## 2023-03-01 NOTE — Patient Instructions (Addendum)

## 2023-03-02 LAB — CMP14+EGFR
ALT: 15 IU/L (ref 0–32)
AST: 18 IU/L (ref 0–40)
Albumin/Globulin Ratio: 1.7 (ref 1.2–2.2)
Albumin: 4 g/dL (ref 3.9–4.9)
Alkaline Phosphatase: 80 IU/L (ref 44–121)
BUN/Creatinine Ratio: 13 (ref 12–28)
BUN: 11 mg/dL (ref 8–27)
Bilirubin Total: 0.4 mg/dL (ref 0.0–1.2)
CO2: 22 mmol/L (ref 20–29)
Calcium: 9.4 mg/dL (ref 8.7–10.3)
Chloride: 105 mmol/L (ref 96–106)
Creatinine, Ser: 0.84 mg/dL (ref 0.57–1.00)
Globulin, Total: 2.3 g/dL (ref 1.5–4.5)
Glucose: 81 mg/dL (ref 70–99)
Potassium: 4.6 mmol/L (ref 3.5–5.2)
Sodium: 140 mmol/L (ref 134–144)
Total Protein: 6.3 g/dL (ref 6.0–8.5)
eGFR: 77 mL/min/{1.73_m2} (ref >59)

## 2023-03-02 LAB — LIPID PANEL
Chol/HDL Ratio: 2.2 ratio (ref 0.0–4.4)
Cholesterol, Total: 198 mg/dL (ref 100–199)
HDL: 89 mg/dL (ref >39)
LDL Chol Calc (NIH): 100 mg/dL — ABNORMAL HIGH (ref 0–99)
Triglycerides: 48 mg/dL (ref 0–149)
VLDL Cholesterol Cal: 9 mg/dL (ref 5–40)

## 2023-03-02 LAB — HEMOGLOBIN A1C
Est. average glucose Bld gHb Est-mCnc: 126 mg/dL
Hgb A1c MFr Bld: 6 % — ABNORMAL HIGH (ref 4.8–5.6)

## 2023-03-08 ENCOUNTER — Encounter: Payer: Self-pay | Admitting: Nurse Practitioner

## 2023-03-08 ENCOUNTER — Ambulatory Visit: Payer: No Typology Code available for payment source | Admitting: Nurse Practitioner

## 2023-03-08 ENCOUNTER — Encounter: Payer: Self-pay | Admitting: Internal Medicine

## 2023-03-08 VITALS — BP 142/84 | HR 60 | Temp 98.2°F | Ht 64.0 in | Wt 199.0 lb

## 2023-03-08 DIAGNOSIS — Z7985 Long-term (current) use of injectable non-insulin antidiabetic drugs: Secondary | ICD-10-CM

## 2023-03-08 DIAGNOSIS — E669 Obesity, unspecified: Secondary | ICD-10-CM | POA: Diagnosis not present

## 2023-03-08 DIAGNOSIS — E1169 Type 2 diabetes mellitus with other specified complication: Secondary | ICD-10-CM

## 2023-03-08 DIAGNOSIS — E7800 Pure hypercholesterolemia, unspecified: Secondary | ICD-10-CM

## 2023-03-08 DIAGNOSIS — Z6834 Body mass index (BMI) 34.0-34.9, adult: Secondary | ICD-10-CM | POA: Diagnosis not present

## 2023-03-08 NOTE — Patient Instructions (Addendum)

## 2023-03-08 NOTE — Progress Notes (Signed)
Office: [phone]  /  Fax: [phone]  WEIGHT SUMMARY AND BIOMETRICS  No data recorded Weight Gained Since Last Visit: 1lb   Vitals Temp: 98.2 F (36.8 C) BP: (!) 142/84 Pulse Rate: 60 SpO2: 100 %   Anthropometric Measurements Height: 5\' 4"  (1.626 m) Weight: 199 lb (90.3 kg) BMI (Calculated): 34.14 Weight at Last Visit: 198lb Weight Gained Since Last Visit: 1lb Starting Weight: 213lb Total Weight Loss (lbs): 14 lb (6.35 kg)   Body Composition  Body Fat %: 45.2 % Fat Mass (lbs): 90.4 lbs Muscle Mass (lbs): 103.8 lbs Total Body Water (lbs): 74.8 lbs Visceral Fat Rating : 13   Other Clinical Data Today's Visit #: 20 Starting Date: 09/03/21     HPI  Chief Complaint: OBESITY  Stephanie French is here to discuss her progress with her obesity treatment plan. She is on the the Category 1 Plan and states she is following her eating plan approximately 40 % of the time. She states she is exercising 60 minutes 2 days per week.   Interval History:  Since last office visit she has gained 1 pound.  She is struggling with meeting her protein intake.  She doesn't like to eat a lot of meat.  She sometimes drinks a protein shake.  She is trying to drink more water.  Denies sugary drinks. She just received a letter from her insurance company that Stephanie French will no longer be covered.  [place] to  [place] last [place].  Recently saw PCP and was prescribed Zepbound.     Pharmacotherapy for weight loss: She is currently taking Wegovy 2.4 mg for medical weight loss.  Denies side effects.    Previous pharmacotherapy for medical weight loss:  Contrave and Wegovy  She stopped Contrave due to side effects of headaches.  Bariatric surgery:   Never had bariatric surgery  Pharmacotherapy for DMT2:  She is currently taking Wegovy 2.4mg .  Denies side effects.   Last A1c was 6.0 CBGs: Fasting 80-90 Episodes of hypoglycemia: no On a statin.  Last eye exam:  2023 She has tried Ozempic (stopped when  she started [facility]), Metformin (stopped due to headaches) in the past.    Lab Results  Component Value Date   HGBA1C 6.0 (H) 03/01/2023   HGBA1C 5.7 (H) 10/28/2022   HGBA1C 6.0 (H) 06/16/2022   Lab Results  Component Value Date   MICROALBUR 10 10/13/2021   [name] 100 (H) 03/01/2023   CREATININE 0.84 03/01/2023      Hyperlipidemia Medication(s): Restarted taking Pravachol after receiving her last lab results.  Was not taking it prior to labs.  FH : multiple family members  Lab Results  Component Value Date   CHOL 198 03/01/2023   HDL 89 03/01/2023   LDLCALC 100 (H) 03/01/2023   TRIG 48 03/01/2023   CHOLHDL 2.2 03/01/2023   Lab Results  Component Value Date   ALT 15 03/01/2023   AST 18 03/01/2023   ALKPHOS 80 03/01/2023   BILITOT 0.4 03/01/2023   The 10-year ASCVD risk score (Arnett DK, et al., 2019) is: 20.1%   Values used to calculate the score:     Age: 67 years     Sex: Female     Is Non-Hispanic African American: Yes     Diabetic: Yes     Tobacco smoker: No     Systolic Blood Pressure: [id number] mmHg     Is BP treated: No     HDL Cholesterol: 89 mg/dL     Total  Cholesterol: 198 mg/dL   PHYSICAL EXAM:  Blood pressure (!) 142/84, pulse 60, temperature 98.2 F (36.8 C), height 5\' 4"  (1.626 m), weight 199 lb (90.3 kg), last menstrual period 08/12/2012, SpO2 100 %. Body mass index is 34.16 kg/m.  General: She is overweight, cooperative, alert, well developed, and in no acute distress. PSYCH: Has normal mood, affect and thought process.   Extremities: No edema.  Neurologic: No gross sensory or motor deficits. No tremors or fasciculations noted.    DIAGNOSTIC DATA REVIEWED:  BMET    Component Value Date/Time   NA 140 03/01/2023 0902   K 4.6 03/01/2023 0902   CL 105 03/01/2023 0902   CO2 22 03/01/2023 0902   GLUCOSE 81 03/01/2023 0902   GLUCOSE 106 (H) 11/09/2010 2241   BUN 11 03/01/2023 0902   CREATININE 0.84 03/01/2023 0902   CALCIUM 9.4 03/01/2023 0902    GFRNONAA 80 02/10/2021 1231   GFRAA 92 02/10/2021 1231   Lab Results  Component Value Date   HGBA1C 6.0 (H) 03/01/2023   HGBA1C 6.1 (H) 12/22/2018   Lab Results  Component Value Date   INSULIN 15.4 09/04/2021   Lab Results  Component Value Date   TSH 2.200 09/04/2021   CBC    Component Value Date/Time   WBC 3.6 10/28/2022 0934   WBC 4.8 08/02/2012 0909   RBC 5.33 (H) 10/28/2022 0934   RBC 5.28 (H) 08/02/2012 0909   HGB 11.7 10/28/2022 0934   HCT 38.2 10/28/2022 0934   PLT 207 10/28/2022 0934   MCV 72 (L) 10/28/2022 0934   MCH 22.0 (L) 10/28/2022 0934   MCH 22.7 (L) 08/02/2012 0909   MCHC 30.6 (L) 10/28/2022 0934   MCHC 31.6 08/02/2012 0909   RDW 16.5 (H) 10/28/2022 0934   Iron Studies    Component Value Date/Time   IRON 95 10/28/2022 0934   TIBC 296 10/28/2022 0934   FERRITIN 104 02/16/2022 0923   IRONPCTSAT 32 10/28/2022 0934   Lipid Panel     Component Value Date/Time   CHOL 198 03/01/2023 0902   TRIG 48 03/01/2023 0902   HDL 89 03/01/2023 0902   CHOLHDL 2.2 03/01/2023 0902   LDLCALC 100 (H) 03/01/2023 0902   Hepatic Function Panel     Component Value Date/Time   PROT 6.3 03/01/2023 0902   ALBUMIN 4.0 03/01/2023 0902   AST 18 03/01/2023 0902   ALT 15 03/01/2023 0902   ALKPHOS 80 03/01/2023 0902   BILITOT 0.4 03/01/2023 0902      Component Value Date/Time   TSH 2.200 09/04/2021 0735   Nutritional Lab Results  Component Value Date   VD25OH 50.6 09/04/2021   VD25OH 21.9 (L) 10/07/2020   VD25OH 17.8 (L) 02/19/2020     ASSESSMENT AND PLAN  TREATMENT PLAN FOR OBESITY:  Recommended Dietary Goals  Stephanie French is currently in the action stage of change. As such, her goal is to continue weight management plan. She has agreed to the Category 1 Plan.  Behavioral Intervention  We discussed the following Behavioral Modification Strategies today: increasing lean protein intake, decreasing simple carbohydrates , increasing vegetables, avoiding  skipping meals, increasing water intake, and work on meal planning and easy cooking plans.  Additional resources provided today: NA  Recommended Physical Activity Goals  Stephanie French has been advised to work up to 150 minutes of moderate intensity aerobic activity a week and strengthening exercises 2-3 times per week for cardiovascular health, weight loss maintenance and preservation of muscle mass.   She  has agreed to Continue current level of physical activity    Pharmacotherapy We discussed various medication options to help Kaylan with her weight loss efforts and we both agreed to continue [facility] 2.4mg  for the next 7 weeks (had a 3 month supply at home).  ASSOCIATED CONDITIONS ADDRESSED TODAY  Action/Plan  Diabetes mellitus type 2 in obese [facility]) Patient is currently taking Wegovy 2.4mg  and was recently prescribed Zepbound by her PCP.  He insurance is no longer going to cover obesity medications.  She plans to reach out to her PCP about starting Mounjaro.    Good blood sugar control is important to decrease the likelihood of diabetic complications such as nephropathy, neuropathy, limb loss, blindness, coronary artery disease, and death. Intensive lifestyle modification including diet, exercise and weight loss are the first line of treatment for diabetes.    Pure hypercholesterolemia Plans to reach out to her PCP to discuss Pravastatin and side effects of muscle pain.  Notes her muscle pain stopped when she stopped taking Pravastatin and restarted back when she restarted taking Pravastatin.    Generalized obesity  BMI 34.0-34.9,adult     Return in about 4 weeks (around 04/05/2023).Marland Kitchen She was informed of the importance of frequent follow up visits to maximize her success with intensive lifestyle modifications for her multiple health conditions.   ATTESTASTION STATEMENTS:  Reviewed by clinician on day of visit: allergies, medications, problem list, medical history, surgical history, family  history, social history, and previous encounter notes.   Time spent on visit including pre-visit chart review and post-visit care and charting was 30 minutes.    Ailene Rud. Helen Winterhalter FNP-C

## 2023-03-17 ENCOUNTER — Other Ambulatory Visit: Payer: Self-pay | Admitting: Internal Medicine

## 2023-04-05 ENCOUNTER — Ambulatory Visit: Payer: No Typology Code available for payment source | Admitting: Bariatrics

## 2023-04-05 ENCOUNTER — Encounter: Payer: Self-pay | Admitting: Bariatrics

## 2023-04-05 VITALS — BP 131/78 | HR 70 | Temp 98.0°F | Ht 64.0 in | Wt 200.0 lb

## 2023-04-05 DIAGNOSIS — Z7985 Long-term (current) use of injectable non-insulin antidiabetic drugs: Secondary | ICD-10-CM

## 2023-04-05 DIAGNOSIS — E7800 Pure hypercholesterolemia, unspecified: Secondary | ICD-10-CM

## 2023-04-05 DIAGNOSIS — E1165 Type 2 diabetes mellitus with hyperglycemia: Secondary | ICD-10-CM | POA: Diagnosis not present

## 2023-04-05 DIAGNOSIS — E669 Obesity, unspecified: Secondary | ICD-10-CM | POA: Diagnosis not present

## 2023-04-05 DIAGNOSIS — Z6834 Body mass index (BMI) 34.0-34.9, adult: Secondary | ICD-10-CM

## 2023-04-06 ENCOUNTER — Other Ambulatory Visit: Payer: Self-pay

## 2023-04-06 ENCOUNTER — Encounter: Payer: Self-pay | Admitting: Bariatrics

## 2023-04-06 MED ORDER — PRAVASTATIN SODIUM 40 MG PO TABS: 40.0000 mg | ORAL_TABLET | Freq: Every day | ORAL | 1 refills | Status: DC

## 2023-04-06 MED ORDER — ZEPBOUND 5 MG/0.5ML ~~LOC~~ SOAJ: 5.0000 mg | SUBCUTANEOUS | 0 refills | Status: DC

## 2023-04-06 NOTE — Progress Notes (Signed)
Chief Complaint:   OBESITY Stephanie French is here to discuss her progress with her obesity treatment plan along with follow-up of her obesity related diagnoses. Stephanie French is on the Category 1 Plan and states she is following her eating plan approximately 0% of the time. Stephanie French states she is doing 0 minutes 0 times per week.  Today's visit was #: 21 Starting weight: 213 lbs Starting date: 09/03/2021 Today's weight: 200 lbs Today's date: 04/05/2023 Total lbs lost to date: 13 Total lbs lost since last in-office visit: 0  Interim History: Stephanie French is up 1 pound since her last visit and she is doing well overall.  She got approved for Zepbound.   Subjective:   1. Pure hypercholesterolemia Stephanie French is taking Pravachol.  2. Uncontrolled type 2 diabetes mellitus with hyperglycemia Stephanie French will start Zepbound, and she has been on Wegovy.   Assessment/Plan:   1. Pure hypercholesterolemia Stephanie French will continue Pravachol, and she will work on eliminating trans fats and limiting saturated fats.  2. Uncontrolled type 2 diabetes mellitus with hyperglycemia Stephanie French will begin Zepbound.   3. Generalized obesity  4. BMI 34.0-34.9,adult Stephanie French is currently in the action stage of change. As such, her goal is to continue with weight loss efforts. She has agreed to the Category 1 Plan.   Meal planning and intentional eating were discussed.  She is to increase her fruit and vegetable intake.  Protein shake sheet was given.  Exercise goals: No exercise has been prescribed at this time.  Behavioral modification strategies: increasing lean protein intake, decreasing simple carbohydrates, increasing vegetables, increasing water intake, decreasing eating out, no skipping meals, meal planning and cooking strategies, and keeping healthy foods in the home.  Stephanie French has agreed to follow-up with our clinic in 4 weeks. She was informed of the importance of frequent follow-up visits to maximize her success with intensive lifestyle  modifications for her multiple health conditions.   Objective:   Blood pressure 131/78, pulse 70, temperature 98 F (36.7 C), height  (1.626 m), weight 200 lb (90.7 kg), last menstrual period 08/12/2012, SpO2 99 %. Body mass index is 34.33 kg/m.  General: Cooperative, alert, well developed, in no acute distress. HEENT: Conjunctivae and lids unremarkable. Cardiovascular: Regular rhythm.  Lungs: Normal work of breathing. Neurologic: No focal deficits.   Lab Results  Component Value Date   CREATININE 0.84 03/01/2023   BUN 11 03/01/2023   NA 140 03/01/2023   K 4.6 03/01/2023   CL 105 03/01/2023   CO2 22 03/01/2023   Lab Results  Component Value Date   ALT 15 03/01/2023   AST 18 03/01/2023   ALKPHOS 80 03/01/2023   BILITOT 0.4 03/01/2023   Lab Results  Component Value Date   HGBA1C 6.0 (H) 03/01/2023   HGBA1C 5.7 (H) 10/28/2022   HGBA1C 6.0 (H) 06/16/2022   HGBA1C 5.8 (H) 02/16/2022   HGBA1C 5.8 (H) 10/13/2021   Lab Results  Component Value Date   INSULIN 15.4 09/04/2021   Lab Results  Component Value Date   TSH 2.200 09/04/2021   Lab Results  Component Value Date   CHOL 198 03/01/2023   HDL 89 03/01/2023   LDLCALC 100 (H) 03/01/2023   TRIG 48 03/01/2023   CHOLHDL 2.2 03/01/2023   Lab Results  Component Value Date   VD25OH 50.6 09/04/2021   VD25OH 21.9 (L) 10/07/2020   VD25OH 17.8 (L) 02/19/2020   Lab Results  Component Value Date   WBC 3.6 10/28/2022   HGB 11.7  10/28/2022   HCT 38.2 10/28/2022   MCV 72 (L) 10/28/2022   PLT 207 10/28/2022   Lab Results  Component Value Date   IRON 95 10/28/2022   TIBC 296 10/28/2022   FERRITIN 104 02/16/2022   Attestation Statements:   Reviewed by clinician on day of visit: allergies, medications, problem list, medical history, surgical history, family history, social history, and previous encounter notes.   Trude Mcburney, am acting as Energy manager for [employer], DO.  I have reviewed the above  documentation for accuracy and completeness, and I agree with the above. Corinna Capra, DO

## 2023-04-07 ENCOUNTER — Other Ambulatory Visit: Payer: Self-pay | Admitting: Internal Medicine

## 2023-04-07 MED ORDER — ATORVASTATIN CALCIUM 10 MG PO TABS: ORAL_TABLET | ORAL | 11 refills | Status: DC

## 2023-04-28 IMAGING — MG MM DIGITAL SCREENING BILAT W/ TOMO AND CAD
8 series · 8 of 24 positions shown · non-contrast
Comparison: Previous exam(s).

CLINICAL DATA: Screening.

EXAM:
DIGITAL SCREENING BILATERAL MAMMOGRAM WITH TOMOSYNTHESIS AND CAD
TECHNIQUE: Bilateral screening digital craniocaudal and mediolateral oblique
mammograms were obtained. Bilateral screening digital breast
tomosynthesis was performed. The images were evaluated with
computer-aided detection.

[L MLO synth-2D]
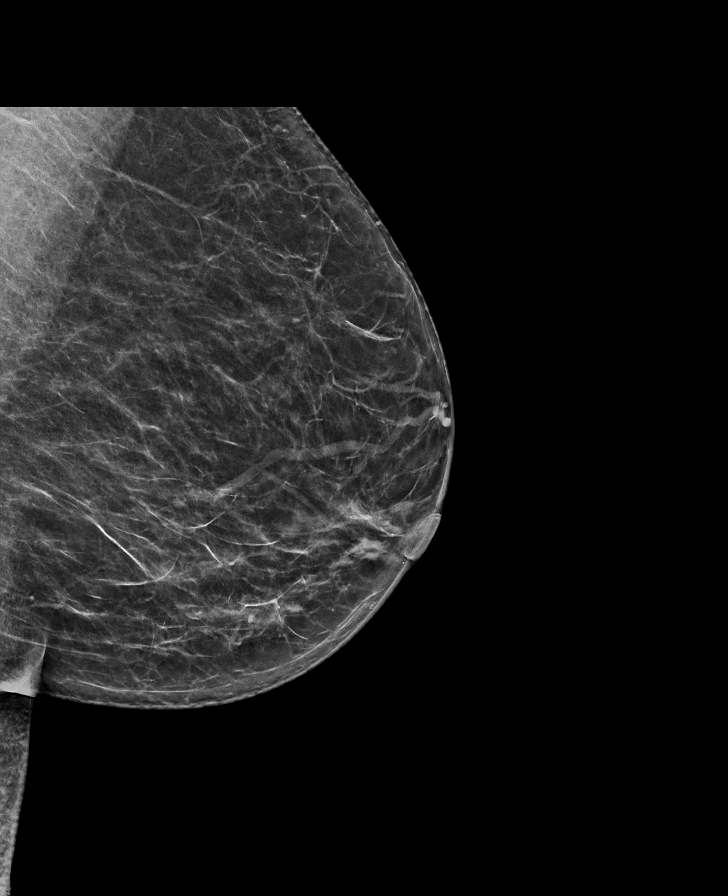

[L CC synth-2D]
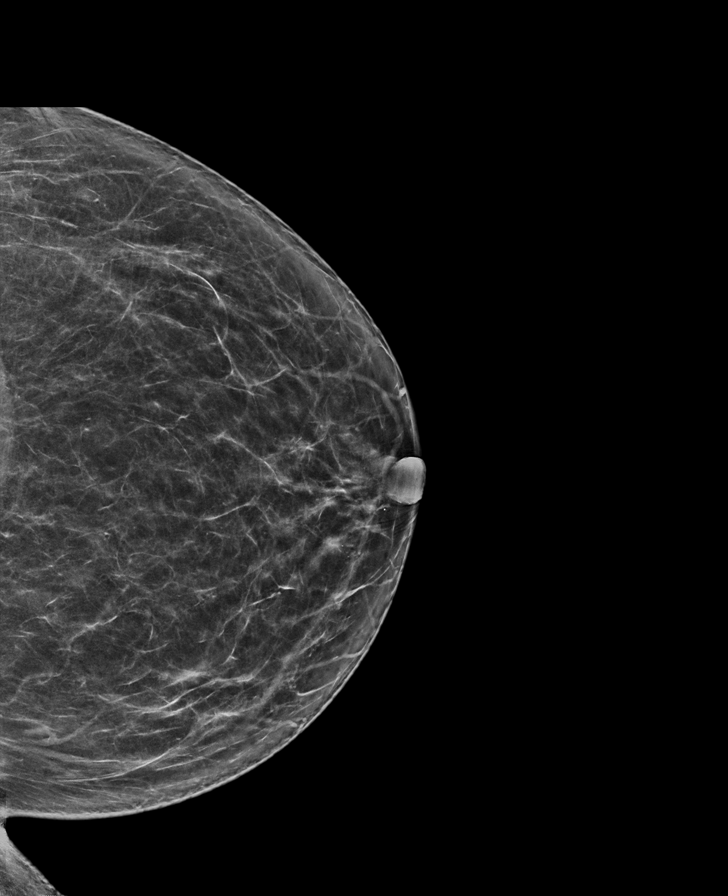

[R CC synth-2D]
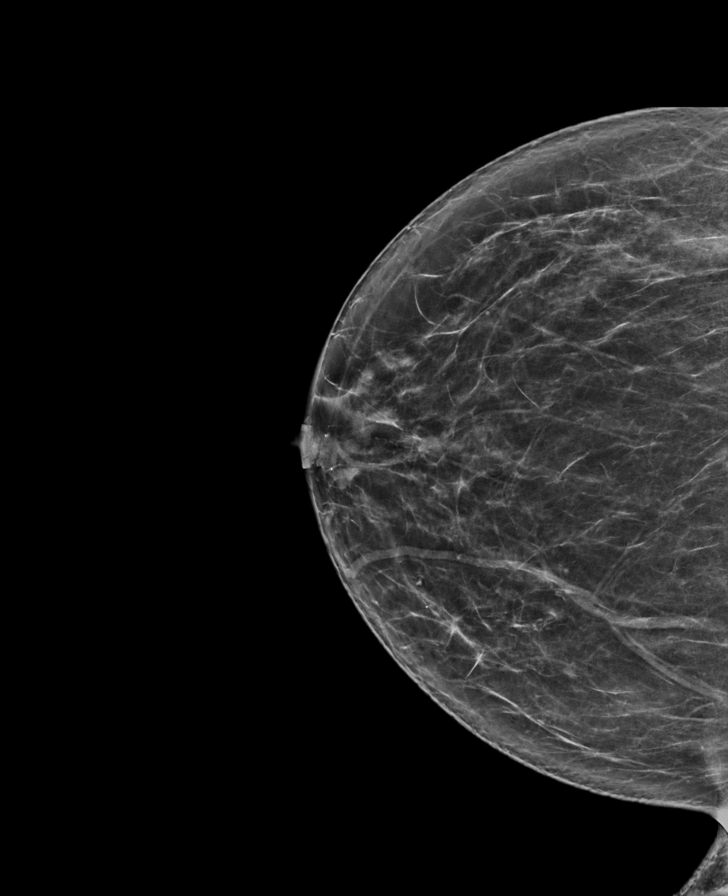

[R MLO synth-2D]
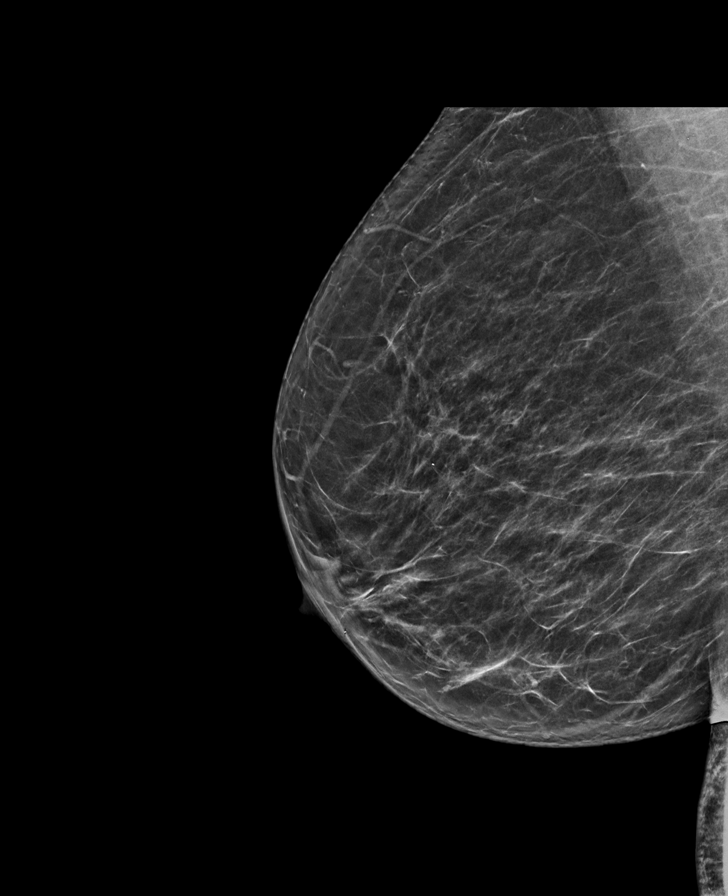

[L CC tomo · tomo slice 36/71.0]
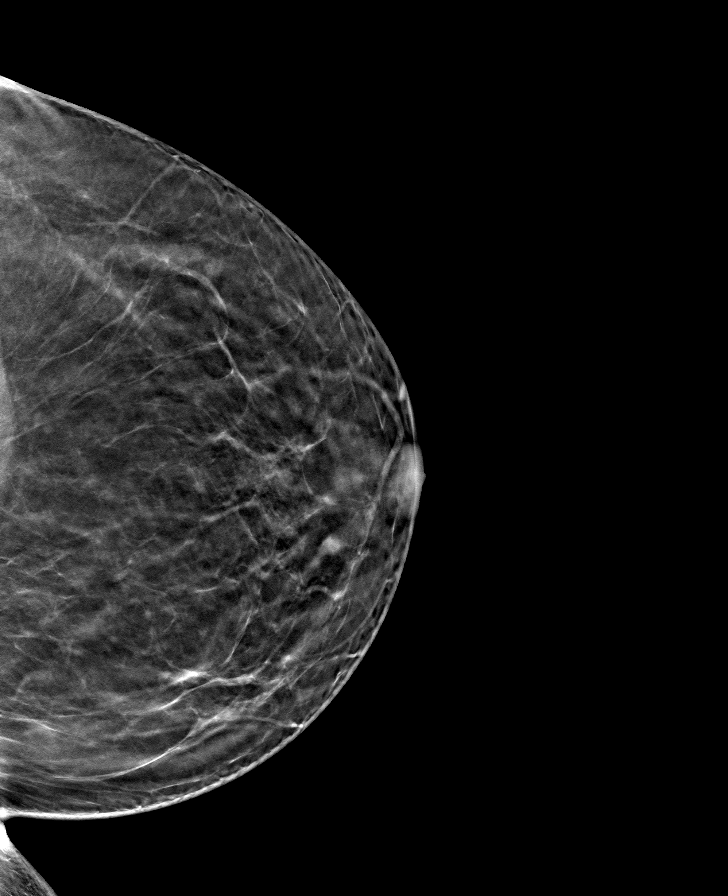

[L MLO tomo · tomo slice 37/74.0]
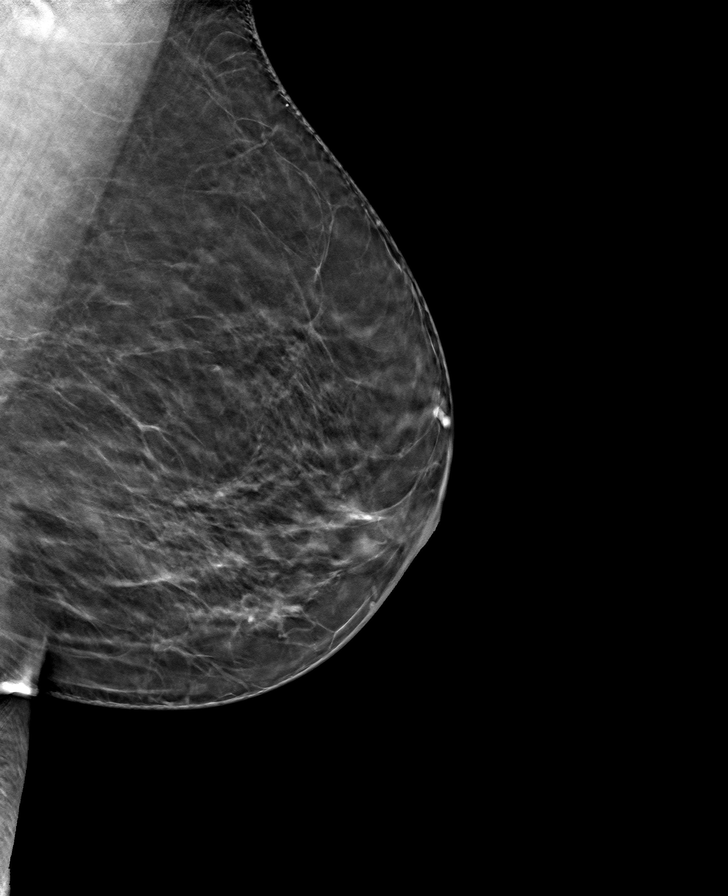

[R CC tomo · tomo slice 36/71.0]
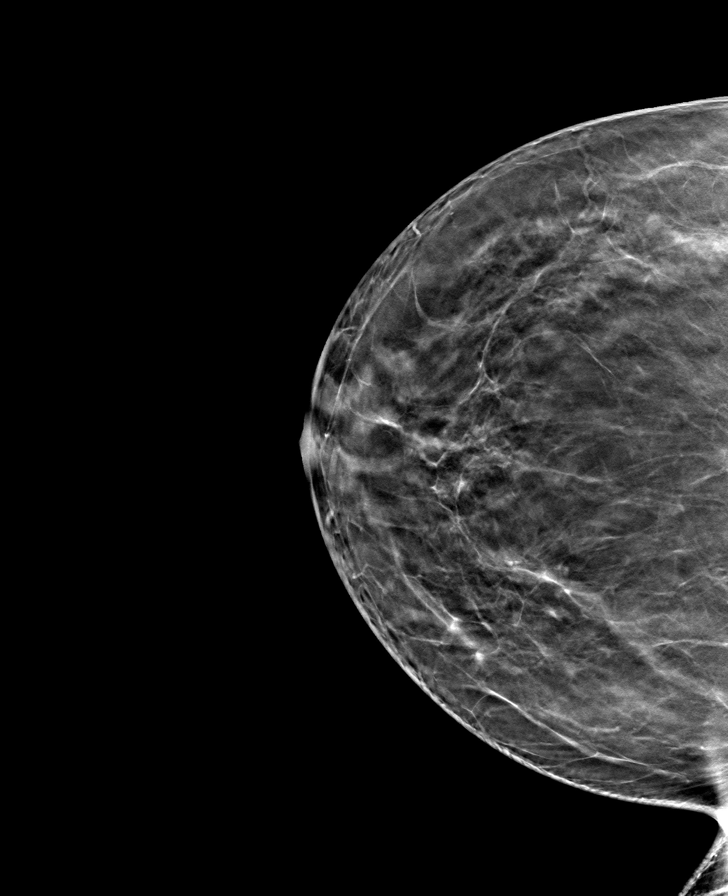

[R MLO tomo · tomo slice 36/71.0]
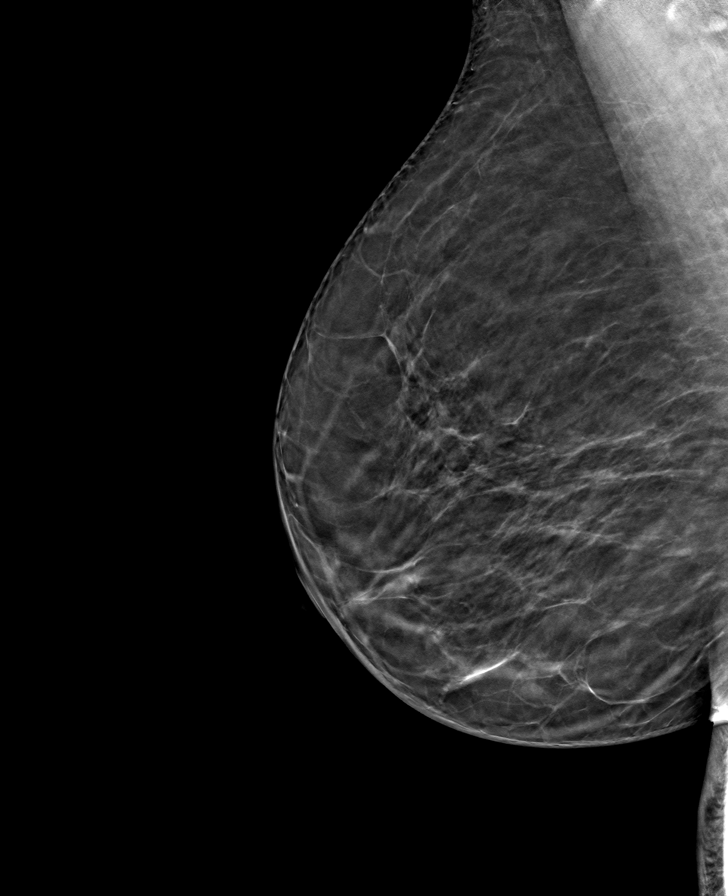

[8 of 24 positions shown; findings below may reference images not displayed]

ACR Breast Density Category b: There are scattered areas of
fibroglandular density.
FINDINGS: There are no findings suspicious for malignancy.
IMPRESSION: No mammographic evidence of malignancy. A result letter of this
screening mammogram will be mailed directly to the patient.

RECOMMENDATION:
Screening mammogram in one year. (Code:51-O-LD2)

BI-RADS CATEGORY  1: Negative.

## 2023-05-03 ENCOUNTER — Telehealth (INDEPENDENT_AMBULATORY_CARE_PROVIDER_SITE_OTHER): Payer: No Typology Code available for payment source | Admitting: Family Medicine

## 2023-05-03 ENCOUNTER — Telehealth: Payer: Self-pay

## 2023-05-03 ENCOUNTER — Encounter (INDEPENDENT_AMBULATORY_CARE_PROVIDER_SITE_OTHER): Payer: Self-pay | Admitting: Family Medicine

## 2023-05-03 DIAGNOSIS — E669 Obesity, unspecified: Secondary | ICD-10-CM | POA: Diagnosis not present

## 2023-05-03 DIAGNOSIS — E1169 Type 2 diabetes mellitus with other specified complication: Secondary | ICD-10-CM

## 2023-05-03 DIAGNOSIS — K5909 Other constipation: Secondary | ICD-10-CM

## 2023-05-03 DIAGNOSIS — Z6834 Body mass index (BMI) 34.0-34.9, adult: Secondary | ICD-10-CM | POA: Diagnosis not present

## 2023-05-03 DIAGNOSIS — Z7985 Long-term (current) use of injectable non-insulin antidiabetic drugs: Secondary | ICD-10-CM

## 2023-05-03 MED ORDER — TIRZEPATIDE 5 MG/0.5ML ~~LOC~~ SOAJ: 5.0000 mg | SUBCUTANEOUS | 1 refills | Status: DC

## 2023-05-03 MED ORDER — TIRZEPATIDE 5 MG/0.5ML ~~LOC~~ SOAJ: 5.0000 mg | SUBCUTANEOUS | 0 refills | Status: DC

## 2023-05-03 NOTE — Progress Notes (Signed)
TeleHealth Visit:  This visit was completed with telemedicine (audio/video) technology. Ariahna has verbally consented to this TeleHealth visit. The patient is located at home, the provider is located at home. The participants in this visit include the listed provider and patient. The visit was conducted today via MyChart video.  OBESITY Deshone is here to discuss her progress with her obesity treatment plan along with follow-up of her obesity related diagnoses.   Today's visit was # 22 Starting weight: 213 lbs Starting date: 09/03/2021 Weight at last in office visit: 200 lbs on 04/05/23 Total weight loss: 13 lbs at last in office visit on 04/05/23. Today's reported weight (05/03/23): none reported  Nutrition Plan: the Category 1 plan  Current exercise:  none  Interim History:  She has been unable to get the Zepbound due to supply issues.  She has been on Wegovy 2.4 mg weekly.  Last injection was 7 days ago.  Weight has been plateaued for 5 months on Wegovy. She is struggling to get in the protein. She has recently discovered that she likes egg whites. She has been concerned about raising her cholesterol with eggs. She skips meals often,  especially dinner.  She frequently eats out lunch.  She is concerned that frozen meals have too much sodium and will cause her to have hypertension.  Eating all of the prescribed protein: no Skipping meals: Yes Drinking adequate water: Yes Drinking sugar sweetened beverages: No Hunger controlled: moderately controlled. Cravings controlled:  moderately controlled.  Pharmacotherapy: Jaryah is on Wegovy 2.4 mg SQ weekly Adverse side effects: None Hunger is moderately controlled.  Cravings are moderately controlled.  Assessment/Plan:  1. Constipation Jaeanna notes constipation.   This is likely related to GLP-1.  She has been taking working on increasing fiber.. Constipation is moderately controlled.   Plan: Increase fiber and water. Add  MiraLAX once daily.    2. Type 2 Diabetes Mellitus with other specified complication, without long-term current use of insulin HgbA1c is at goal. Last A1c was 6.0 CBGs: Fasting 80-106 Episodes of hypoglycemia: no Medication(s): Wegovy 2.4 mg SQ weekly.  No side effects.  Lab Results  Component Value Date   HGBA1C 6.0 (H) 03/01/2023   HGBA1C 5.7 (H) 10/28/2022   HGBA1C 6.0 (H) 06/16/2022   Lab Results  Component Value Date   MICROALBUR 10 10/13/2021   LDLCALC 100 (H) 03/01/2023   CREATININE 0.84 03/01/2023   No results found for: "GFR"  Plan: Discontinue Wegovy 2.4 mg weekly. Start Mounjaro 5.0 mg SQ weekly   3. Generalized Obesity: Current BMI 34  Monisha is currently in the action stage of change. As such, her goal is to continue with weight loss efforts.  She has agreed to the Category 1 plan.  1.  Discussed importance of eating regular meals. 2.  She will work on packing her lunch rather than eating out.  Advised that having a frozen meal daily will not cause her to have hypertension. 3.  Recipe sent via MyChart for egg muffins.  Exercise goals: No exercise has been prescribed at this time.  Behavioral modification strategies: increasing lean protein intake, decreasing simple carbohydrates , no meal skipping, and meal planning .  Cherry has agreed to follow-up with our clinic in 5 weeks.   No orders of the defined types were placed in this encounter.   Medications Discontinued During This Encounter  Medication Reason   tirzepatide (ZEPBOUND) 5 MG/0.5ML Pen Duplicate   tirzepatide (MOUNJARO) 5 MG/0.5ML Pen Reorder  Meds ordered this encounter  Medications   DISCONTD: tirzepatide (MOUNJARO) 5 MG/0.5ML Pen    Sig: Inject 5 mg into the skin once a week.    Dispense:  2 mL    Refill:  0    Order Specific Question:   Supervising Provider    Answer:   Glennis Brink [[id number]]   tirzepatide (MOUNJARO) 5 MG/0.5ML Pen    Sig: Inject 5 mg into the skin once a week.     Dispense:  2 mL    Refill:  1    Order Specific Question:   Supervising Provider    Answer:   Glennis Brink [[id number]]      Objective:   VITALS: Per patient if applicable, see vitals. GENERAL: Alert and in no acute distress. CARDIOPULMONARY: No increased WOB. Speaking in clear sentences.  PSYCH: Pleasant and cooperative. Speech normal rate and rhythm. Affect is appropriate. Insight and judgement are appropriate. Attention is focused, linear, and appropriate.  NEURO: Oriented as arrived to appointment on time with no prompting.   Attestation Statements:   Reviewed by clinician on day of visit: allergies, medications, problem list, medical history, surgical history, family history, social history, and previous encounter notes.  This was prepared with the assistance of Engineer, civil (consulting).  Occasional wrong-word or sound-a-like substitutions may have occurred due to the inherent limitations of voice recognition software.

## 2023-05-03 NOTE — Telephone Encounter (Signed)
PA submitted through Cover My Meds for [facility]. Awaiting insurance determination. Key: [id number]

## 2023-05-04 NOTE — Telephone Encounter (Signed)
Received fax from [facility] that Greggory Keen has been denied. A1C not above 6.5.

## 2023-06-14 ENCOUNTER — Ambulatory Visit: Payer: No Typology Code available for payment source | Admitting: Bariatrics

## 2023-06-14 ENCOUNTER — Encounter: Payer: Self-pay | Admitting: Bariatrics

## 2023-06-14 VITALS — BP 146/81 | HR 54 | Temp 98.0°F | Ht 64.0 in | Wt 203.0 lb

## 2023-06-14 DIAGNOSIS — E7800 Pure hypercholesterolemia, unspecified: Secondary | ICD-10-CM | POA: Diagnosis not present

## 2023-06-14 DIAGNOSIS — Z6834 Body mass index (BMI) 34.0-34.9, adult: Secondary | ICD-10-CM | POA: Diagnosis not present

## 2023-06-14 DIAGNOSIS — E1169 Type 2 diabetes mellitus with other specified complication: Secondary | ICD-10-CM | POA: Diagnosis not present

## 2023-06-14 DIAGNOSIS — E669 Obesity, unspecified: Secondary | ICD-10-CM

## 2023-06-14 DIAGNOSIS — Z7985 Long-term (current) use of injectable non-insulin antidiabetic drugs: Secondary | ICD-10-CM

## 2023-06-14 MED ORDER — ZEPBOUND 5 MG/0.5ML ~~LOC~~ SOAJ: 5.0000 mg | SUBCUTANEOUS | 0 refills | Status: DC

## 2023-06-14 NOTE — Progress Notes (Signed)
WEIGHT SUMMARY AND BIOMETRICS  Weight Gained Since Last Visit: 3lb   Vitals Temp: 98 F (36.7 C) BP: (!) 146/81 Pulse Rate: (!) 54 SpO2: 100 %   Anthropometric Measurements Height: 5\' 4"  (1.626 m) Weight: 203 lb (92.1 kg) BMI (Calculated): 34.83 Weight at Last Visit: 200lb Weight Gained Since Last Visit: 3lb Starting Weight: 200lb Total Weight Loss (lbs): 10 lb (4.536 kg)   Body Composition  Body Fat %: 45.7 % Fat Mass (lbs): 93.2 lbs Muscle Mass (lbs): 105 lbs Total Body Water (lbs): 75.8 lbs Visceral Fat Rating : 14   Other Clinical Data Fasting: no Labs: no Today's Visit #: 23 Starting Date: 09/03/21    OBESITY Emaan is here to discuss her progress with her obesity treatment plan along with follow-up of her obesity related diagnoses.     Nutrition Plan: the Category 1 plan - 0% adherence.  Current exercise: none  Interim History:  She is up 3 lbs since her last visit.  Eating all of the food on the plan., Is not skipping meals, and Water intake is inadequate.  Pharmacotherapy: Ellysa is on Wegovy 2.4 mg SQ weekly She was changed to [facility], but denied coverage, and then switched to the Zepbound, but hard to get. I will retry, if not will go back to Ozempic.  Adverse side effects: None Hunger is moderately controlled.  Cravings are moderately controlled.  Assessment/Plan:   Type II Diabetes HgbA1c is at goal. Last A1c was 6.0 CBGs: Not checking      Episodes of hypoglycemia: no Medication(s): Wegovy 2.4 mg SQ weekly  Lab Results  Component Value Date   HGBA1C 6.0 (H) 03/01/2023   HGBA1C 5.7 (H) 10/28/2022   HGBA1C 6.0 (H) 06/16/2022   Lab Results  Component Value Date   MICROALBUR 10 10/13/2021   LDLCALC 100 (H) 03/01/2023   CREATININE 0.84 03/01/2023   No results found for: "GFR"  Plan: Change to Zepbound 5.0 mg SQ weekly Continue all other medications.  Will keep all carbohydrates low both sweets and starches.  Will  continue exercise regimen to 30 to 60 minutes on most days of the week.  Aim for 7 to 9 hours of sleep nightly.  Eat more low glycemic index foods.     2. Pure cholesterolemia:  Hyperlipidemia LDL is not at goal. Medication(s): Lipitor  Cardiovascular risk factors: diabetes mellitus, dyslipidemia, and obesity (BMI >= 30 kg/m2)  Lab Results  Component Value Date   CHOL 198 03/01/2023   HDL 89 03/01/2023   LDLCALC 100 (H) 03/01/2023   TRIG 48 03/01/2023   CHOLHDL 2.2 03/01/2023   Lab Results  Component Value Date   ALT 15 03/01/2023   AST 18 03/01/2023   ALKPHOS 80 03/01/2023   BILITOT 0.4 03/01/2023   The 10-year ASCVD risk score (Arnett DK, et al., 2019) is: 21.3%   Values used to calculate the score:     Age: 67 years     Sex: Female     Is Non-Hispanic African American: Yes     Diabetic: Yes     Tobacco smoker: No     Systolic Blood Pressure: 146 mmHg     Is BP treated: No     HDL Cholesterol: 89 mg/dL     Total Cholesterol: 198 mg/dL  Plan:  Continue statin.  Information sheet on healthy vs unhealthy fats.  Will avoid all trans fats.  Will read labels Will minimize saturated fats except the following: low fat meats in  moderation, diary, and limited dark chocolate.  Increase Omega 3 in foods, and consider an Omega 3 supplement.     Generalized Obesity: Current BMI BMI (Calculated): 34.83   Pharmacotherapy Plan Change to  Zepbound 5.0 mg SQ weekly  Cinderella is currently in the action stage of change. As such, her goal is to continue with weight loss efforts.  She has agreed to the Category 1 plan.  Exercise goals: Older adults should follow the adult guidelines. When older adults cannot meet the adult guidelines, they should be as physically active as their abilities and conditions will allow.   Behavioral modification strategies: increasing lean protein intake, meal planning , increasing fiber rich foods, avoiding temptations, and travel eating  strategies.  Saranya has agreed to follow-up with our clinic in 4 weeks.      Objective:   VITALS: Per patient if applicable, see vitals. GENERAL: Alert and in no acute distress. CARDIOPULMONARY: No increased WOB. Speaking in clear sentences.  PSYCH: Pleasant and cooperative. Speech normal rate and rhythm. Affect is appropriate. Insight and judgement are appropriate. Attention is focused, linear, and appropriate.  NEURO: Oriented as arrived to appointment on time with no prompting.   Attestation Statements:   This was prepared with the assistance of Engineer, civil (consulting).  Occasional wrong-word or sound-a-like substitutions may have occurred due to the inherent limitations of voice recognition software.   Corinna Capra, DO

## 2023-06-25 ENCOUNTER — Other Ambulatory Visit: Payer: Self-pay | Admitting: Internal Medicine

## 2023-07-06 ENCOUNTER — Encounter: Payer: Self-pay | Admitting: Internal Medicine

## 2023-07-06 ENCOUNTER — Ambulatory Visit: Payer: No Typology Code available for payment source | Admitting: Internal Medicine

## 2023-07-06 VITALS — BP 118/78 | HR 98 | Temp 98.3°F | Ht 64.0 in | Wt 207.2 lb

## 2023-07-06 DIAGNOSIS — Z6835 Body mass index (BMI) 35.0-35.9, adult: Secondary | ICD-10-CM

## 2023-07-06 DIAGNOSIS — G8929 Other chronic pain: Secondary | ICD-10-CM | POA: Insufficient documentation

## 2023-07-06 DIAGNOSIS — M79641 Pain in right hand: Secondary | ICD-10-CM | POA: Insufficient documentation

## 2023-07-06 DIAGNOSIS — E1169 Type 2 diabetes mellitus with other specified complication: Secondary | ICD-10-CM | POA: Diagnosis not present

## 2023-07-06 DIAGNOSIS — E785 Hyperlipidemia, unspecified: Secondary | ICD-10-CM | POA: Diagnosis not present

## 2023-07-06 DIAGNOSIS — M25561 Pain in right knee: Secondary | ICD-10-CM | POA: Diagnosis not present

## 2023-07-06 DIAGNOSIS — Z79899 Other long term (current) drug therapy: Secondary | ICD-10-CM

## 2023-07-06 NOTE — Assessment & Plan Note (Signed)
Squeeze test negative. I will check an arthritis panel. She may benefit from applying Voltaren gel to her hand twice daily. In the meantime, she can try Tylenol prn.

## 2023-07-06 NOTE — Assessment & Plan Note (Signed)
She has gained 7 lbs since April. She is also followed by [facility]. She will transition to Zepbound in the near future. Unfortunately, her knee pain has impacted her ability to exercise regularly.

## 2023-07-06 NOTE — Assessment & Plan Note (Signed)
Chronic, LDL goal < 70.  She was started on atorvastatin 10mg  M-F at her last visit. I will recheck a lipid panel today. She is currently on weekly semaglutide 2.4mg  weekly. She has three more doses and will switch to Zepbound 5mg  weekly.

## 2023-07-06 NOTE — Assessment & Plan Note (Signed)
She agrees to Ortho eval for radioagraphic studies and further evaluation.

## 2023-07-06 NOTE — Progress Notes (Signed)
I,Jameka J Llittleton, CMA,acting as a Neurosurgeon for [name], MD.,have documented all relevant documentation on the behalf of [name], MD,as directed by  [name], MD while in the presence of [name], MD.  Subjective:  Patient ID: Stephanie French , female    DOB: 1956-07-20 , 67 y.o.   MRN: [id number]  Chief Complaint  Patient presents with   Diabetes    HPI  She presents today for DM check. She reports compliance with meds. She denies headaches, chest pain and shortness of breath.  Patient admits not yet completing DM eye exam. She is aware she is due for her eye exam.  She reports a constant pain in her right leg. She does not remember when this initially started. She admits this has been going on for a while.   Diabetes She presents for her follow-up diabetic visit. She has type 2 diabetes mellitus. There are no hypoglycemic associated symptoms. Pertinent negatives for diabetes include no blurred vision, no chest pain, no polydipsia, no polyphagia and no polyuria. There are no hypoglycemic complications. Symptoms are stable. Risk factors for coronary artery disease include diabetes mellitus, obesity and post-menopausal. Her breakfast blood glucose is taken between 8-9 am. Her breakfast blood glucose range is generally 90-110 mg/dl. An ACE inhibitor/angiotensin II receptor blocker is not being taken.  Hyperlipidemia This is a chronic problem. The current episode started more than 1 year ago. Exacerbating diseases include diabetes and obesity. Pertinent negatives include no chest pain. Current antihyperlipidemic treatment includes statins. Risk factors for coronary artery disease include diabetes mellitus, dyslipidemia, obesity and post-menopausal.     Past Medical History:  Diagnosis Date   Anemia    Anxiety    Atypical chest pain 08/31/2019   Back pain    Constipation    Depression    Diabetes mellitus    Edema of both lower extremities    GERD  (gastroesophageal reflux disease)    Hyperlipidemia    Joint pain    Obesity    Pure hypercholesterolemia 12/07/2019   Shortness of breath 08/31/2019   Vitamin D deficiency      Family History  Problem Relation Age of Onset   Diabetes Mother    Hypertension Mother    Stroke Mother    Heart attack Mother    High Cholesterol Mother    Early death Father    Lung cancer Father    Diabetes Sister    Hypertension Sister    Breast cancer Maternal Grandmother    Heart attack Maternal Grandmother    Cancer Other    Hypertension Other    Hyperlipidemia Other    Stroke Other    Heart attack Other      Current Outpatient Medications:    atorvastatin (LIPITOR) 10 MG tablet, One tab po qd M-F, skip Sat/Sundays, Disp: 30 tablet, Rfl: 11   hydrocortisone (ANUSOL-HC) 2.5 % rectal cream, Place 1 application rectally 2 (two) times daily as needed for hemorrhoids or anal itching., Disp: 30 g, Rfl: 0   Multiple Vitamin (MULTIVITAMIN) capsule, Take 1 capsule by mouth daily. For 50 plus, Disp: , Rfl:    ONETOUCH VERIO test strip, USE AS INSTRUCTED TO CHECK BLOOD SUGAR 3 TIMES A DAY. DX CODE E11.65, Disp: 300 strip, Rfl: 12   WEGOVY 2.4 MG/0.75ML SOAJ, SMARTSIG:2.4 Milligram(s) SUB-Q Once a Week, Disp: , Rfl:    tirzepatide (ZEPBOUND) 5 MG/0.5ML Pen, Inject 5 mg into the skin once a week. (Patient not  taking: Reported on 07/06/2023), Disp: 2 mL, Rfl: 0   Allergies  Allergen Reactions   Egg-Derived Products Other (See Comments)    Allergic to the protein in the egg white   Latex    Other Itching and Other (See Comments)    Other reaction(s): Unknown   Peanut-Containing Drug Products Other (See Comments)   Penicillins Other (See Comments)   Shellfish Allergy Hives, Itching, Photosensitivity and Rash   Shiitake Mushroom Hives, Itching, Photosensitivity and Rash     Review of Systems  Constitutional: Negative.   Eyes:  Negative for blurred vision.  Respiratory: Negative.    Cardiovascular:  Negative.  Negative for chest pain.  Endocrine: Negative for polydipsia, polyphagia and polyuria.  Musculoskeletal:  Positive for arthralgias.  Neurological: Negative.   Psychiatric/Behavioral: Negative.       Today's Vitals   07/06/23 0827  BP: 118/78  Pulse: 98  Temp: 98.3 F (36.8 C)  SpO2: 98%  Weight: 207 lb 3.2 oz (94 kg)  Height: 5\' 4"  (1.626 m)   Body mass index is 35.57 kg/m.  Wt Readings from Last 3 Encounters:  07/06/23 207 lb 3.2 oz (94 kg)  06/14/23 203 lb (92.1 kg)  04/05/23 200 lb (90.7 kg)    The 10-year ASCVD risk score (Arnett DK, et al., 2019) is: 13.4%   Values used to calculate the score:     Age: 25 years     Sex: Female     Is Non-Hispanic African American: Yes     Diabetic: Yes     Tobacco smoker: No     Systolic Blood Pressure: 118 mmHg     Is BP treated: No     HDL Cholesterol: 89 mg/dL     Total Cholesterol: 198 mg/dL  Objective:  Physical Exam Vitals and nursing note reviewed.  Constitutional:      Appearance: Normal appearance.  HENT:     Head: Normocephalic and atraumatic.  Eyes:     Extraocular Movements: Extraocular movements intact.  Cardiovascular:     Rate and Rhythm: Normal rate and regular rhythm.     Heart sounds: Normal heart sounds.  Pulmonary:     Effort: Pulmonary effort is normal.     Breath sounds: Normal breath sounds.  Musculoskeletal:        General: Swelling present.     Cervical back: Normal range of motion.     Comments: Neg squeeze test  Skin:    General: Skin is warm.  Neurological:     General: No focal deficit present.     Mental Status: She is alert.  Psychiatric:        Mood and Affect: Mood normal.        Behavior: Behavior normal.         Assessment And Plan:  Dyslipidemia associated with type 2 diabetes mellitus (HCC) Assessment & Plan: Chronic, LDL goal < 70.  She was started on atorvastatin 10mg  M-F at her last visit. I will recheck a lipid panel today. She is currently on weekly  semaglutide 2.4mg  weekly. She has three more doses and will switch to Zepbound 5mg  weekly.   Orders: -     Basic metabolic panel -     Hemoglobin A1c -     Lipid panel -     ALT  Chronic pain of right knee Assessment & Plan: She agrees to Ortho eval for radioagraphic studies and further evaluation.   Orders: -     Ambulatory referral to  Orthopedics  Right hand pain Assessment & Plan: Squeeze test negative. I will check an arthritis panel. She may benefit from applying Voltaren gel to her hand twice daily. In the meantime, she can try Tylenol prn.   Orders: -     ANA, IFA (with reflex) -     CYCLIC CITRUL PEPTIDE ANTIBODY, IGG/IGA -     Rheumatoid factor -     Sedimentation rate -     Uric acid  Class 2 severe obesity due to excess calories with serious comorbidity and body mass index (BMI) of 35.0 to 35.9 in adult [facility]) Assessment & Plan: She has gained 7 lbs since April. She is also followed by [facility]. She will transition to Zepbound in the near future. Unfortunately, her knee pain has impacted her ability to exercise regularly.    Drug therapy -     Vitamin B12  She is encouraged to strive for BMI less than 30 to decrease cardiac risk. Advised to aim for at least 150 minutes of exercise per week.   Return for keep next appt as scheduled .  Patient was given opportunity to ask questions. Patient verbalized understanding of the plan and was able to repeat key elements of the plan. All questions were answered to their satisfaction.    I, [name], MD, have reviewed all documentation for this visit. The documentation on 07/06/23 for the exam, diagnosis, procedures, and orders are all accurate and complete.   IF YOU HAVE BEEN REFERRED TO A SPECIALIST, IT MAY TAKE 1-2 WEEKS TO SCHEDULE/PROCESS THE REFERRAL. IF YOU HAVE NOT HEARD FROM US/SPECIALIST IN TWO WEEKS, PLEASE GIVE [place] A CALL AT [phone].

## 2023-07-06 NOTE — Patient Instructions (Signed)

## 2023-07-08 LAB — BASIC METABOLIC PANEL
BUN/Creatinine Ratio: 13 (ref 12–28)
BUN: 11 mg/dL (ref 8–27)
CO2: 21 mmol/L (ref 20–29)
Calcium: 9.6 mg/dL (ref 8.7–10.3)
Chloride: 103 mmol/L (ref 96–106)
Creatinine, Ser: 0.87 mg/dL (ref 0.57–1.00)
Glucose: 77 mg/dL (ref 70–99)
Potassium: 4.7 mmol/L (ref 3.5–5.2)
Sodium: 141 mmol/L (ref 134–144)
eGFR: 74 mL/min/{1.73_m2} (ref >59)

## 2023-07-08 LAB — ALT: ALT: 23 IU/L (ref 0–32)

## 2023-07-08 LAB — LIPID PANEL
Chol/HDL Ratio: 1.8 ratio (ref 0.0–4.4)
Cholesterol, Total: 172 mg/dL (ref 100–199)
HDL: 97 mg/dL (ref >39)
LDL Chol Calc (NIH): 65 mg/dL (ref 0–99)
Triglycerides: 50 mg/dL (ref 0–149)
VLDL Cholesterol Cal: 10 mg/dL (ref 5–40)

## 2023-07-08 LAB — SEDIMENTATION RATE: Sed Rate: 17 mm/hr (ref 0–40)

## 2023-07-08 LAB — HEMOGLOBIN A1C
Est. average glucose Bld gHb Est-mCnc: 123 mg/dL
Hgb A1c MFr Bld: 5.9 % — ABNORMAL HIGH (ref 4.8–5.6)

## 2023-07-08 LAB — RHEUMATOID FACTOR: Rheumatoid fact SerPl-aCnc: 11.4 IU/mL (ref <14.0)

## 2023-07-08 LAB — URIC ACID: Uric Acid: 4.2 mg/dL (ref 3.0–7.2)

## 2023-07-08 LAB — CYCLIC CITRUL PEPTIDE ANTIBODY, IGG/IGA: Cyclic Citrullin Peptide Ab: 5 units (ref 0–19)

## 2023-07-08 LAB — VITAMIN B12: Vitamin B-12: 455 pg/mL (ref 232–1245)

## 2023-07-08 LAB — ANTINUCLEAR ANTIBODIES, IFA: ANA Titer 1: NEGATIVE

## 2023-07-12 ENCOUNTER — Encounter: Payer: Self-pay | Admitting: Nurse Practitioner

## 2023-07-12 ENCOUNTER — Ambulatory Visit: Payer: No Typology Code available for payment source | Admitting: Nurse Practitioner

## 2023-07-12 VITALS — BP 120/70 | HR 64 | Temp 98.3°F | Ht 64.0 in | Wt 203.0 lb

## 2023-07-12 DIAGNOSIS — E7800 Pure hypercholesterolemia, unspecified: Secondary | ICD-10-CM | POA: Diagnosis not present

## 2023-07-12 DIAGNOSIS — Z6834 Body mass index (BMI) 34.0-34.9, adult: Secondary | ICD-10-CM

## 2023-07-12 DIAGNOSIS — E1169 Type 2 diabetes mellitus with other specified complication: Secondary | ICD-10-CM | POA: Diagnosis not present

## 2023-07-12 DIAGNOSIS — E669 Obesity, unspecified: Secondary | ICD-10-CM

## 2023-07-12 NOTE — Progress Notes (Signed)
Office: [phone]  /  Fax: [phone]  WEIGHT SUMMARY AND BIOMETRICS  Weight Lost Since Last Visit: 0lb  Weight Gained Since Last Visit: 0lb   Vitals Temp: 98.3 F (36.8 C) BP: 120/70 Pulse Rate: 64 SpO2: 97 %   Anthropometric Measurements Height: 5\' 4"  (1.626 m) Weight: 203 lb (92.1 kg) BMI (Calculated): 34.83 Weight at Last Visit: 203lb Weight Lost Since Last Visit: 0lb Weight Gained Since Last Visit: 0lb Starting Weight: 200lb Total Weight Loss (lbs): 0 lb (0 kg)   Body Composition  Body Fat %: 45.6 % Fat Mass (lbs): 92.8 lbs Muscle Mass (lbs): 105 lbs Total Body Water (lbs): 78.8 lbs Visceral Fat Rating : 14   Other Clinical Data Fasting: No Labs: No Today's Visit #: 24 Starting Date: 09/03/21     HPI  Chief Complaint: OBESITY  Stephanie French is here to discuss her progress with her obesity treatment plan. She is on the the Category 1 Plan and states she is following her eating plan approximately 50 % of the time. She states she is exercising 0 minutes 0 days per week.   Interval History:  Since last office visit she has maintained her weight.    BF:  2 [place] sausage and egg whites Lunch:  soup or sandwich or what is available at work [employer]:  skips Snacks:  cheese its, granola bar, popcorn Drinks:  water  She struggles with vegetables intake when at work   Pharmacotherapy for weight loss: She is currently taking Wegovy 2.4mg   for medical weight loss.  Reports side effects of constipation.  Will continue Wegovy 2.4mg  one more week and then plans to start Zepbound 5mg  (August 4th) the week after she finishes Wegovy.   Previous pharmacotherapy for medical weight loss:  Contrave and Wegovy  She stopped Contrave due to side effects of headaches.   Bariatric surgery:  Patient has not had bariatric surgery.   Pharmacotherapy for DMT2:  She is currently taking Wegovy 2.4mg .  Denies side effects.  Was prescribed Mounjaro but wasn't able to start due  to cost/denied by insurance.  Last A1c was 5.9 CBGs: Fasting 72-133 Episodes of hypoglycemia: no On a statin.  Last eye exam:  2023 She has tried Ozempic (stopped when she started [facility]), Metformin (stopped due to headaches) in the past.    Lab Results  Component Value Date   HGBA1C 5.9 (H) 07/06/2023   HGBA1C 6.0 (H) 03/01/2023   HGBA1C 5.7 (H) 10/28/2022   Lab Results  Component Value Date   MICROALBUR 10 10/13/2021   LDLCALC 65 07/06/2023   CREATININE 0.87 07/06/2023    Hyperlipidemia Medication(s): Lipitor 10mg . Denies side effects.    Lab Results  Component Value Date   CHOL 172 07/06/2023   HDL 97 07/06/2023   LDLCALC 65 07/06/2023   TRIG 50 07/06/2023   CHOLHDL 1.8 07/06/2023   Lab Results  Component Value Date   ALT 23 07/06/2023   AST 18 03/01/2023   ALKPHOS 80 03/01/2023   BILITOT 0.4 03/01/2023   The 10-year ASCVD risk score (Arnett DK, et al., 2019) is: 12%   Values used to calculate the score:     Age: 67 years     Sex: Female     Is Non-Hispanic African American: Yes     Diabetic: Yes     Tobacco smoker: No     Systolic Blood Pressure: 120 mmHg     Is BP treated: No     HDL Cholesterol: 97 mg/dL  Total Cholesterol: 172 mg/dL   PHYSICAL EXAM:  Blood pressure 120/70, pulse 64, temperature 98.3 F (36.8 C), height 5\' 4"  (1.626 m), weight 203 lb (92.1 kg), last menstrual period 08/12/2012, SpO2 97%. Body mass index is 34.84 kg/m.  General: She is overweight, cooperative, alert, well developed, and in no acute distress. PSYCH: Has normal mood, affect and thought process.   Extremities: No edema.  Neurologic: No gross sensory or motor deficits. No tremors or fasciculations noted.    DIAGNOSTIC DATA REVIEWED:  BMET    Component Value Date/Time   NA 141 07/06/2023 0859   K 4.7 07/06/2023 0859   CL 103 07/06/2023 0859   CO2 21 07/06/2023 0859   GLUCOSE 77 07/06/2023 0859   GLUCOSE 106 (H) 11/09/2010 2241   BUN 11 07/06/2023 0859    CREATININE 0.87 07/06/2023 0859   CALCIUM 9.6 07/06/2023 0859   GFRNONAA 80 02/10/2021 1231   GFRAA 92 02/10/2021 1231   Lab Results  Component Value Date   HGBA1C 5.9 (H) 07/06/2023   HGBA1C 6.1 (H) 12/22/2018   Lab Results  Component Value Date   INSULIN 15.4 09/04/2021   Lab Results  Component Value Date   TSH 2.200 09/04/2021   CBC    Component Value Date/Time   WBC 3.6 10/28/2022 0934   WBC 4.8 08/02/2012 0909   RBC 5.33 (H) 10/28/2022 0934   RBC 5.28 (H) 08/02/2012 0909   HGB 11.7 10/28/2022 0934   HCT 38.2 10/28/2022 0934   PLT 207 10/28/2022 0934   MCV 72 (L) 10/28/2022 0934   MCH 22.0 (L) 10/28/2022 0934   MCH 22.7 (L) 08/02/2012 0909   MCHC 30.6 (L) 10/28/2022 0934   MCHC 31.6 08/02/2012 0909   RDW 16.5 (H) 10/28/2022 0934   Iron Studies    Component Value Date/Time   IRON 95 10/28/2022 0934   TIBC 296 10/28/2022 0934   FERRITIN 104 02/16/2022 0923   IRONPCTSAT 32 10/28/2022 0934   Lipid Panel     Component Value Date/Time   CHOL 172 07/06/2023 0859   TRIG 50 07/06/2023 0859   HDL 97 07/06/2023 0859   CHOLHDL 1.8 07/06/2023 0859   LDLCALC 65 07/06/2023 0859   Hepatic Function Panel     Component Value Date/Time   PROT 6.3 03/01/2023 0902   ALBUMIN 4.0 03/01/2023 0902   AST 18 03/01/2023 0902   ALT 23 07/06/2023 0859   ALKPHOS 80 03/01/2023 0902   BILITOT 0.4 03/01/2023 0902      Component Value Date/Time   TSH 2.200 09/04/2021 0735   Nutritional Lab Results  Component Value Date   VD25OH 50.6 09/04/2021   VD25OH 21.9 (L) 10/07/2020   VD25OH 17.8 (L) 02/19/2020     ASSESSMENT AND PLAN  TREATMENT PLAN FOR OBESITY:  Recommended Dietary Goals  Merilee is currently in the action stage of change. As such, her goal is to continue weight management plan. She has agreed to the Category 1 Plan.  Behavioral Intervention  We discussed the following Behavioral Modification Strategies today: increasing lean protein intake, decreasing  simple carbohydrates , increasing vegetables, increasing lower glycemic fruits, increasing fiber rich foods, avoiding skipping meals, increasing water intake, continue to practice mindfulness when eating, and planning for success.  Additional resources provided today: NA  Recommended Physical Activity Goals  Lizzeth has been advised to work up to 150 minutes of moderate intensity aerobic activity a week and strengthening exercises 2-3 times per week for cardiovascular health, weight loss maintenance and  preservation of muscle mass.   She has agreed to Think about ways to increase daily physical activity and overcoming barriers to exercise and Increase physical activity in their day and reduce sedentary time (increase NEAT).   Pharmacotherapy We discussed various medication options to help Aella with her weight loss efforts and we both agreed to continue [facility] 2.4mg  x 1 more dose and then will start Zepbound 5mg  the week after her last Wegovy dose.  Side effects discussed.  ASSOCIATED CONDITIONS ADDRESSED TODAY  Action/Plan  Type 2 diabetes mellitus with other specified complication, without long-term current use of insulin (HCC) Good blood sugar control is important to decrease the likelihood of diabetic complications such as nephropathy, neuropathy, limb loss, blindness, coronary artery disease, and death. Intensive lifestyle modification including diet, exercise and weight loss are the first line of treatment for diabetes.    Pure hypercholesterolemia Continue to follow up with PCP.  Continue meds as directed.    Generalized obesity: Starting BMI 36.5  BMI 34.0-34.9,adult     Will obtain IC at next visit    Return in about 4 weeks (around 08/09/2023).Marland Kitchen She was informed of the importance of frequent follow up visits to maximize her success with intensive lifestyle modifications for her multiple health conditions.   ATTESTASTION STATEMENTS:  Reviewed by clinician on day of visit:  allergies, medications, problem list, medical history, surgical history, family history, social history, and previous encounter notes.   Time spent on visit including pre-visit chart review and post-visit care and charting was 30 minutes.    Theodis Sato. Dimitrius Steedman FNP-C

## 2023-07-20 ENCOUNTER — Other Ambulatory Visit (INDEPENDENT_AMBULATORY_CARE_PROVIDER_SITE_OTHER): Payer: No Typology Code available for payment source

## 2023-07-20 ENCOUNTER — Encounter: Payer: Self-pay | Admitting: Orthopaedic Surgery

## 2023-07-20 ENCOUNTER — Ambulatory Visit: Payer: No Typology Code available for payment source | Admitting: Orthopaedic Surgery

## 2023-07-20 DIAGNOSIS — M25561 Pain in right knee: Secondary | ICD-10-CM

## 2023-07-20 DIAGNOSIS — G8929 Other chronic pain: Secondary | ICD-10-CM

## 2023-07-20 MED ORDER — MELOXICAM 7.5 MG PO TABS: 7.5000 mg | ORAL_TABLET | Freq: Two times a day (BID) | ORAL | 2 refills | Status: DC | PRN

## 2023-07-20 NOTE — Progress Notes (Signed)
Office Visit Note   Patient: Stephanie French           Date of Birth: 12-20-1956           MRN: [id number] Visit Date: 07/20/2023              Requested by: Dorothyann Peng, MD [address] [place],  [place] [address] PCP: Dorothyann Peng, MD   Assessment & Plan: Visit Diagnoses:  1. Chronic pain of right knee     Plan: Impression is 67 year old female with right knee osteoarthritis flare.  Disease process and treatment options were explained.  Patient would like to try a 2-week course of NSAID instead of a cortisone injection.  I have sent in a prescription for meloxicam.  She will follow-up with me if she decides she wants to try the cortisone injection.  Follow-Up Instructions: No follow-ups on file.   Orders:  Orders Placed This Encounter  Procedures   XR KNEE 3 VIEW RIGHT   Meds ordered this encounter  Medications   meloxicam (MOBIC) 7.5 MG tablet    Sig: Take 1 tablet (7.5 mg total) by mouth 2 (two) times daily as needed for pain.    Dispense:  30 tablet    Refill:  2      Procedures: No procedures performed   Clinical Data: No additional findings.   Subjective: Chief Complaint  Patient presents with   Right Knee - Pain    HPI Stephanie French is a 67 year old female here for evaluation of chronic right knee pain for months.  Denies any injuries or changes in activity.  The knee feels swollen towards the end of the day.  Denies any mechanical symptoms.  She does report low back pain and occasional radiation of pain throughout the entire leg. Review of Systems  Constitutional: Negative.   HENT: Negative.    Eyes: Negative.   Respiratory: Negative.    Cardiovascular: Negative.   Endocrine: Negative.   Musculoskeletal: Negative.   Neurological: Negative.   Hematological: Negative.   Psychiatric/Behavioral: Negative.    All other systems reviewed and are negative.    Objective: Vital Signs: LMP 08/12/2012   Physical Exam Vitals and nursing note  reviewed.  Constitutional:      Appearance: She is well-developed.  HENT:     Head: Atraumatic.     Nose: Nose normal.  Eyes:     Extraocular Movements: Extraocular movements intact.  Cardiovascular:     Pulses: Normal pulses.  Pulmonary:     Effort: Pulmonary effort is normal.  Abdominal:     Palpations: Abdomen is soft.  Musculoskeletal:     Cervical back: Neck supple.  Skin:    General: Skin is warm.     Capillary Refill: Capillary refill takes less than 2 seconds.  Neurological:     Mental Status: She is alert. Mental status is at baseline.  Psychiatric:        Behavior: Behavior normal.        Thought Content: Thought content normal.        Judgment: Judgment normal.     Ortho Exam Examination of the right knee shows mild pain with movement and range of motion.  Collaterals and cruciates are stable.  No joint effusion.  No sciatic tension signs. Specialty Comments:  No specialty comments available.  Imaging: No results found.   PMFS History: Patient Active Problem List   Diagnosis Date Noted   Chronic pain of right knee 07/06/2023  Right hand pain 07/06/2023   Generalized obesity 01/25/2023   BMI 34.0-34.9,adult 01/25/2023   Class 2 severe obesity with serious comorbidity and body mass index (BMI) of 36.0 to 36.9 in adult [facility]) 11/02/2022   Dyslipidemia associated with type 2 diabetes mellitus (HCC) 10/28/2022   Vitamin D deficiency 09/21/2022   Diabetes mellitus type 2 in obese 08/11/2022   Cold intolerance 02/16/2022   Pap smear abnormality of cervix/human papillomavirus (HPV) positive 10/13/2021   Spondylolisthesis, lumbar region 09/23/2020   Pure hypercholesterolemia 12/07/2019   Shortness of breath 08/31/2019   Uncontrolled type 2 diabetes mellitus with hyperglycemia (HCC) 12/22/2018   Class 2 severe obesity due to excess calories with serious comorbidity and body mass index (BMI) of 35.0 to 35.9 in adult [facility]) 12/22/2018   Fibroids 08/02/2012    Past Medical History:  Diagnosis Date   Anemia    Anxiety    Atypical chest pain 08/31/2019   Back pain    Constipation    Depression    Diabetes mellitus    Edema of both lower extremities    GERD (gastroesophageal reflux disease)    Hyperlipidemia    Joint pain    Obesity    Pure hypercholesterolemia 12/07/2019   Shortness of breath 08/31/2019   Vitamin D deficiency     Family History  Problem Relation Age of Onset   Diabetes Mother    Hypertension Mother    Stroke Mother    Heart attack Mother    High Cholesterol Mother    Early death Father    Lung cancer Father    Diabetes Sister    Hypertension Sister    Breast cancer Maternal Grandmother    Heart attack Maternal Grandmother    Cancer Other    Hypertension Other    Hyperlipidemia Other    Stroke Other    Heart attack Other     Past Surgical History:  Procedure Laterality Date   OOPHORECTOMY Left 1983   left ovary removed   Social History   Occupational History   Occupation: Production designer, theatre/television/film  Tobacco Use   Smoking status: Former    Current packs/day: 0.10    Average packs/day: 0.1 packs/day for 40.0 years (4.0 ttl pk-yrs)    Types: Cigarettes   Smokeless tobacco: Never  Vaping Use   Vaping status: Never Used  Substance and Sexual Activity   Alcohol use: No   Drug use: No   Sexual activity: Not Currently    Birth control/protection: None

## 2023-07-29 ENCOUNTER — Other Ambulatory Visit: Payer: Self-pay | Admitting: Bariatrics

## 2023-08-02 MED ORDER — ZEPBOUND 5 MG/0.5ML ~~LOC~~ SOAJ: 5.0000 mg | SUBCUTANEOUS | 0 refills | Status: DC

## 2023-08-04 ENCOUNTER — Encounter: Payer: Self-pay | Admitting: Internal Medicine

## 2023-08-04 ENCOUNTER — Encounter: Payer: Self-pay | Admitting: Bariatrics

## 2023-08-16 ENCOUNTER — Ambulatory Visit: Payer: No Typology Code available for payment source | Admitting: Nurse Practitioner

## 2023-08-16 ENCOUNTER — Other Ambulatory Visit (INDEPENDENT_AMBULATORY_CARE_PROVIDER_SITE_OTHER): Payer: Self-pay | Admitting: Bariatrics

## 2023-08-16 MED ORDER — ZEPBOUND 5 MG/0.5ML ~~LOC~~ SOAJ: 5.0000 mg | SUBCUTANEOUS | 0 refills | Status: DC

## 2023-08-17 ENCOUNTER — Other Ambulatory Visit (INDEPENDENT_AMBULATORY_CARE_PROVIDER_SITE_OTHER): Payer: Self-pay | Admitting: Bariatrics

## 2023-08-17 DIAGNOSIS — E1165 Type 2 diabetes mellitus with hyperglycemia: Secondary | ICD-10-CM

## 2023-08-17 MED ORDER — OZEMPIC (0.25 OR 0.5 MG/DOSE) 2 MG/1.5ML ~~LOC~~ SOPN: 0.5000 mg | PEN_INJECTOR | SUBCUTANEOUS | 0 refills | Status: DC

## 2023-08-18 ENCOUNTER — Telehealth: Payer: Self-pay

## 2023-08-18 NOTE — Telephone Encounter (Signed)
Started a PA fpr Ozempic via covermymeds

## 2023-08-18 NOTE — Telephone Encounter (Signed)
Qzempic approved through August 17, 2026.

## 2023-08-19 ENCOUNTER — Telehealth (INDEPENDENT_AMBULATORY_CARE_PROVIDER_SITE_OTHER): Payer: Self-pay

## 2023-08-19 NOTE — Telephone Encounter (Signed)
PA for Ozempic has been approved

## 2023-08-31 ENCOUNTER — Ambulatory Visit
Admission: RE | Admit: 2023-08-31 | Discharge: 2023-08-31 | Disposition: A | Discharge status: Home / Self Care | Payer: No Typology Code available for payment source | Source: Ambulatory Visit | Attending: Internal Medicine | Admitting: Internal Medicine

## 2023-08-31 DIAGNOSIS — E2839 Other primary ovarian failure: Secondary | ICD-10-CM

## 2023-09-06 ENCOUNTER — Ambulatory Visit: Payer: No Typology Code available for payment source | Admitting: Nurse Practitioner

## 2023-09-06 ENCOUNTER — Encounter: Payer: Self-pay | Admitting: Nurse Practitioner

## 2023-09-06 VITALS — BP 140/78 | HR 69 | Temp 97.9°F | Ht 64.0 in | Wt 208.0 lb

## 2023-09-06 DIAGNOSIS — Z7985 Long-term (current) use of injectable non-insulin antidiabetic drugs: Secondary | ICD-10-CM | POA: Diagnosis not present

## 2023-09-06 DIAGNOSIS — Z6835 Body mass index (BMI) 35.0-35.9, adult: Secondary | ICD-10-CM | POA: Diagnosis not present

## 2023-09-06 DIAGNOSIS — E1169 Type 2 diabetes mellitus with other specified complication: Secondary | ICD-10-CM

## 2023-09-06 DIAGNOSIS — E669 Obesity, unspecified: Secondary | ICD-10-CM

## 2023-09-06 DIAGNOSIS — R0602 Shortness of breath: Secondary | ICD-10-CM

## 2023-09-06 MED ORDER — SEMAGLUTIDE (1 MG/DOSE) 4 MG/3ML ~~LOC~~ SOPN: 1.0000 mg | PEN_INJECTOR | SUBCUTANEOUS | 0 refills | Status: DC

## 2023-09-06 NOTE — Progress Notes (Signed)
Office: [phone]  /  Fax: [phone]  WEIGHT SUMMARY AND BIOMETRICS  Weight Lost Since Last Visit: 0lb  Weight Gained Since Last Visit: 5lb   Vitals Temp: 97.9 F (36.6 C) BP: (!) 140/78 Pulse Rate: 69 SpO2: 99 %   Anthropometric Measurements Height: 5\' 4"  (1.626 m) Weight: 208 lb (94.3 kg) BMI (Calculated): 35.69 Weight at Last Visit: 203lb Weight Lost Since Last Visit: 0lb Weight Gained Since Last Visit: 5lb Starting Weight: 200lb Total Weight Loss (lbs): 0 lb (0 kg)   Body Composition  Body Fat %: 47 % Fat Mass (lbs): 97.8 lbs Muscle Mass (lbs): 104.8 lbs Total Body Water (lbs): 78.2 lbs Visceral Fat Rating : 14   Other Clinical Data RMR: [id number] Fasting: Yes Labs: No Today's Visit #: 25 Starting Date: 09/03/21     HPI  Chief Complaint: OBESITY  Stephanie French is here to discuss her progress with her obesity treatment plan. She is on the the Category 1 Plan and states she is following her eating plan approximately 50 % of the time. She states she is exercising 30 minutes 5 days per week.   Interval History:  Since last office visit she gained 5 pounds.  Does well with breakfast.  Struggling with cravings especially sweets and has been overeating candy.  Feels like she is not eating enough of "the right foods". Drinking water and coffee daily.     Pharmacotherapy for weight loss: She is not currently taking medications  for medical weight loss.   Previous pharmacotherapy for medical weight loss:  Reginal Lutes, Zepbound (took last on 07/28/23.  Felt she had more cravings while taking Zepbound) and Contrave. She stopped Contrave due to side effects of headaches. Did well on Wegovy but hasn't been able to continue taking due to cost    Bariatric surgery:  Patient has not had bariatric surgery.    Pharmacotherapy for DMT2:  She is currently taking Ozempic 0.5mg  (x 3 doses-next dose is next week)  Denies side effects.  Was prescribed Mounjaro but wasn't able to start  due to cost/denied by insurance.  Last A1c was 5.9 CBGs: Fasting 72-133 Episodes of hypoglycemia: no On a statin.  Last eye exam:  2023 She has tried Ozempic (stopped when she started [facility]), Metformin (stopped due to headaches) in the past. Struggling with cravings.  Wants to "just eat and eat".  Ozempic approved through August 28th, 2027  [facility] with exertion IC was 1382 on 09/03/21 Last IC was 1339 on 07/13/22 Today IC was 1872   PHYSICAL EXAM:  Blood pressure (!) 140/78, pulse 69, temperature 97.9 F (36.6 C), height 5\' 4"  (1.626 m), weight 208 lb (94.3 kg), last menstrual period 08/12/2012, SpO2 99%. Body mass index is 35.7 kg/m.  General: She is overweight, cooperative, alert, well developed, and in no acute distress. PSYCH: Has normal mood, affect and thought process.   Extremities: No edema.  Neurologic: No gross sensory or motor deficits. No tremors or fasciculations noted.    DIAGNOSTIC DATA REVIEWED:  BMET    Component Value Date/Time   NA 141 07/06/2023 0859   K 4.7 07/06/2023 0859   CL 103 07/06/2023 0859   CO2 21 07/06/2023 0859   GLUCOSE 77 07/06/2023 0859   GLUCOSE 106 (H) 11/09/2010 2241   BUN 11 07/06/2023 0859   CREATININE 0.87 07/06/2023 0859   CALCIUM 9.6 07/06/2023 0859   GFRNONAA 80 02/10/2021 1231   GFRAA 92 02/10/2021 1231   Lab Results  Component Value Date  HGBA1C 5.9 (H) 07/06/2023   HGBA1C 6.1 (H) 12/22/2018   Lab Results  Component Value Date   INSULIN 15.4 09/04/2021   Lab Results  Component Value Date   TSH 2.200 09/04/2021   CBC    Component Value Date/Time   WBC 3.6 10/28/2022 0934   WBC 4.8 08/02/2012 0909   RBC 5.33 (H) 10/28/2022 0934   RBC 5.28 (H) 08/02/2012 0909   HGB 11.7 10/28/2022 0934   HCT 38.2 10/28/2022 0934   PLT 207 10/28/2022 0934   MCV 72 (L) 10/28/2022 0934   MCH 22.0 (L) 10/28/2022 0934   MCH 22.7 (L) 08/02/2012 0909   MCHC 30.6 (L) 10/28/2022 0934   MCHC 31.6 08/02/2012 0909   RDW 16.5 (H)  10/28/2022 0934   Iron Studies    Component Value Date/Time   IRON 95 10/28/2022 0934   TIBC 296 10/28/2022 0934   FERRITIN 104 02/16/2022 0923   IRONPCTSAT 32 10/28/2022 0934   Lipid Panel     Component Value Date/Time   CHOL 172 07/06/2023 0859   TRIG 50 07/06/2023 0859   HDL 97 07/06/2023 0859   CHOLHDL 1.8 07/06/2023 0859   LDLCALC 65 07/06/2023 0859   Hepatic Function Panel     Component Value Date/Time   PROT 6.3 03/01/2023 0902   ALBUMIN 4.0 03/01/2023 0902   AST 18 03/01/2023 0902   ALT 23 07/06/2023 0859   ALKPHOS 80 03/01/2023 0902   BILITOT 0.4 03/01/2023 0902      Component Value Date/Time   TSH 2.200 09/04/2021 0735   Nutritional Lab Results  Component Value Date   VD25OH 50.6 09/04/2021   VD25OH 21.9 (L) 10/07/2020   VD25OH 17.8 (L) 02/19/2020     ASSESSMENT AND PLAN  TREATMENT PLAN FOR OBESITY:  Recommended Dietary Goals  Stephanie French is currently in the action stage of change. As such, her goal is to continue weight management plan. She has agreed to the Category 2 Plan-1200-1300 calories and 75+ grams of protein.  Using myfitness pal.    Behavioral Intervention  We discussed the following Behavioral Modification Strategies today: increasing lean protein intake, decreasing simple carbohydrates , increasing vegetables, increasing lower glycemic fruits, increasing water intake, work on tracking and journaling calories using tracking application, reading food labels , keeping healthy foods at home, continue to practice mindfulness when eating, and planning for success.  Additional resources provided today: Cat 2 plan  Recommended Physical Activity Goals  Stephanie French has been advised to work up to 150 minutes of moderate intensity aerobic activity a week and strengthening exercises 2-3 times per week for cardiovascular health, weight loss maintenance and preservation of muscle mass.   She has agreed to Continue current level of physical activity      ASSOCIATED CONDITIONS ADDRESSED TODAY  Action/Plan  Type 2 diabetes mellitus with other specified complication, without long-term current use of insulin (HCC) -     Semaglutide (1 MG/DOSE); Inject 1 mg as directed once a week.  Dispense: 3 mL; Refill: 0. Side effects discussed.  Has one more dose of 0.5mg  and then will increase to 1mg  after finishing 0.5mg .    Good blood sugar control is important to decrease the likelihood of diabetic complications such as nephropathy, neuropathy, limb loss, blindness, coronary artery disease, and death. Intensive lifestyle modification including diet, exercise and weight loss are the first line of treatment for diabetes.    SOB (shortness of breath) IC Improved.  Will increase to cat 2 meal plan.  Continue  working on increasing protein intake, water intake and exercising.    Generalized obesity: Starting BMI 36.5  BMI 35.0-35.9,adult         Return in about 4 weeks (around 10/04/2023).Marland Kitchen She was informed of the importance of frequent follow up visits to maximize her success with intensive lifestyle modifications for her multiple health conditions.   ATTESTASTION STATEMENTS:  Reviewed by clinician on day of visit: allergies, medications, problem list, medical history, surgical history, family history, social history, and previous encounter notes.   Time spent on visit including pre-visit chart review and post-visit care and charting was 30 minutes.    Theodis Sato. Analiya Porco FNP-C

## 2023-09-20 ENCOUNTER — Encounter: Payer: Self-pay | Admitting: Internal Medicine

## 2023-10-01 ENCOUNTER — Other Ambulatory Visit: Payer: Self-pay | Admitting: Nurse Practitioner

## 2023-10-01 DIAGNOSIS — E1169 Type 2 diabetes mellitus with other specified complication: Secondary | ICD-10-CM

## 2023-10-18 ENCOUNTER — Encounter: Payer: Self-pay | Admitting: Bariatrics

## 2023-10-18 ENCOUNTER — Ambulatory Visit: Payer: No Typology Code available for payment source | Admitting: Bariatrics

## 2023-10-18 VITALS — BP 127/79 | HR 51 | Temp 97.6°F | Ht 64.0 in | Wt 207.0 lb

## 2023-10-18 DIAGNOSIS — Z6835 Body mass index (BMI) 35.0-35.9, adult: Secondary | ICD-10-CM

## 2023-10-18 DIAGNOSIS — E1169 Type 2 diabetes mellitus with other specified complication: Secondary | ICD-10-CM | POA: Diagnosis not present

## 2023-10-18 DIAGNOSIS — Z7985 Long-term (current) use of injectable non-insulin antidiabetic drugs: Secondary | ICD-10-CM

## 2023-10-18 DIAGNOSIS — F5089 Other specified eating disorder: Secondary | ICD-10-CM | POA: Diagnosis not present

## 2023-10-18 DIAGNOSIS — E669 Obesity, unspecified: Secondary | ICD-10-CM

## 2023-10-18 MED ORDER — SEMAGLUTIDE (2 MG/DOSE) 8 MG/3ML ~~LOC~~ SOPN: 2.0000 mg | PEN_INJECTOR | SUBCUTANEOUS | 0 refills | Status: DC

## 2023-10-18 NOTE — Progress Notes (Signed)
WEIGHT SUMMARY AND BIOMETRICS  Weight Lost Since Last Visit: 1lb  Weight Gained Since Last Visit: 0   Vitals Temp: 97.6 F (36.4 C) BP: 127/79 Pulse Rate: (!) 51 SpO2: 97 %   Anthropometric Measurements Height: 5\' 4"  (1.626 m) Weight: 207 lb (93.9 kg) BMI (Calculated): 35.51 Weight at Last Visit: 208lb Weight Lost Since Last Visit: 1lb Weight Gained Since Last Visit: 0 Starting Weight: 200lb Total Weight Loss (lbs): 0 lb (0 kg)   Body Composition  Body Fat %: 46.4 % Fat Mass (lbs): 96.2 lbs Muscle Mass (lbs): 105.4 lbs Total Body Water (lbs): 76.4 lbs Visceral Fat Rating : 14   Other Clinical Data Fasting: no Labs: no Today's Visit #: 26 Starting Date: 09/03/21    OBESITY Stephanie French is here to discuss her progress with her obesity treatment plan along with follow-up of her obesity related diagnoses.     Nutrition Plan: the Category 2 plan - 0% adherence.  Current exercise: none  Interim History:  She is down 1 pound since her last visit.  Her Ozempic was increased at her last visit to 1 mg, but she is still having hunger and more cravings.  She had been on Wegovy in the past at the highest dose, but had to switch to Ozempic due to insurance.  Eating all of the food on the plan., Protein intake is as prescribed, Is not skipping meals, Not meeting protein goals., Water intake is inadequate., Reports polyphagia, and Reports excessive cravings.  Pharmacotherapy: Stephanie French is on Ozempic 1 mg SQ weekly Adverse side effects: None Hunger is poorly controlled.  Cravings are poorly controlled.  Assessment/Plan:   1. Type 2 diabetes mellitus with other specified complication, without long-term current use of insulin (HCC) Type II Diabetes HgbA1c is at goal. Last A1c was 5.9 Episodes of hypoglycemia: no Medication(s): Ozempic 1 mg SQ weekly  Lab Results  Component Value Date   HGBA1C 5.9 (H) 07/06/2023   HGBA1C 6.0 (H) 03/01/2023   HGBA1C 5.7 (H)  10/28/2022   Lab Results  Component Value Date   MICROALBUR 10 10/13/2021   LDLCALC 65 07/06/2023   CREATININE 0.87 07/06/2023    Plan: Continue and increase dose Ozempic 2 mg SQ weekly Continue all other medications.  Will keep all carbohydrates low both sweets and starches. She will increase her water and add lemon or lime.  Will continue exercise regimen to 30 to 60 minutes on most days of the week.  Aim for 7 to 9 hours of sleep nightly.  Eat more low glycemic index foods.    Eating disorder/emotional eating Stephanie French has had issues with stress eating, nighttime eating, and boredom eating. Currently this is moderately controlled. Overall mood is stable. Denies suicidal/homicidal ideation. Medication(s): none  Plan:  Motivational interviewing as well as evidence-based interventions for health behavior change were utilized today including the discussion of self monitoring techniques, problem-solving barriers and SMART goal setting techniques.  She will increase her water and her protein, and minimize any sweets or excessive carbs. May consider a medication for cravings if no resolution of cravings.  Discussed distractions to curb eating behaviors. Be sure to get adequate rest as lack of rest can trigger appetite.  Have plan in place for stressful events.  Consider other rewards besides food.     Generalized Obesity: Current BMI BMI (Calculated): 35.51   Pharmacotherapy Plan Continue and increase dose  Ozempic 2 mg SQ weekly  Stephanie French is currently in the action stage of change. As  such, her goal is to continue with weight loss efforts.  She has agreed to the Category 2 plan and journaling breakfast with goal of 1,200 to 1,300 calories and 75 grams of protein.  Exercise goals: Older adults should determine their level of effort for physical activity relative to their level of fitness.   Behavioral modification strategies: increasing lean protein intake, meal planning , increase  water intake, better snacking choices, planning for success, get rid of junk food in the home, ways to avoid boredom eating, ways to avoid night time snacking, avoiding temptations, and mindful eating.  Stephanie French has agreed to follow-up with our clinic in 4 weeks.   No orders of the defined types were placed in this encounter.   Medications Discontinued During This Encounter  Medication Reason   Semaglutide,0.25 or 0.5MG /DOS, (OZEMPIC, 0.25 OR 0.5 MG/DOSE,) 2 MG/1.5ML SOPN Patient Preference      Objective:   VITALS: Per patient if applicable, see vitals. GENERAL: Alert and in no acute distress. CARDIOPULMONARY: No increased WOB. Speaking in clear sentences.  PSYCH: Pleasant and cooperative. Speech normal rate and rhythm. Affect is appropriate. Insight and judgement are appropriate. Attention is focused, linear, and appropriate.  NEURO: Oriented as arrived to appointment on time with no prompting.   Attestation Statements:   This was prepared with the assistance of Engineer, civil (consulting).  Occasional wrong-word or sound-a-like substitutions may have occurred due to the inherent limitations of voice recognition software.  Corinna Capra, DO

## 2023-10-27 ENCOUNTER — Other Ambulatory Visit: Payer: Self-pay | Admitting: Internal Medicine

## 2023-10-27 DIAGNOSIS — Z1231 Encounter for screening mammogram for malignant neoplasm of breast: Secondary | ICD-10-CM

## 2023-11-12 ENCOUNTER — Other Ambulatory Visit: Payer: Self-pay | Admitting: Bariatrics

## 2023-11-13 ENCOUNTER — Other Ambulatory Visit: Payer: Self-pay | Admitting: Bariatrics

## 2023-11-15 ENCOUNTER — Telehealth (INDEPENDENT_AMBULATORY_CARE_PROVIDER_SITE_OTHER): Payer: Self-pay | Admitting: Bariatrics

## 2023-11-15 MED ORDER — SEMAGLUTIDE (2 MG/DOSE) 8 MG/3ML ~~LOC~~ SOPN: 2.0000 mg | PEN_INJECTOR | SUBCUTANEOUS | 0 refills | Status: DC

## 2023-11-15 NOTE — Telephone Encounter (Signed)
Pt is calling needing a refill on Rx Ozempic and state that she is out of the medication.  Pharm: [facility] on [address]

## 2023-11-16 ENCOUNTER — Ambulatory Visit (INDEPENDENT_AMBULATORY_CARE_PROVIDER_SITE_OTHER): Payer: No Typology Code available for payment source | Admitting: Internal Medicine

## 2023-11-16 ENCOUNTER — Encounter: Payer: Self-pay | Admitting: Internal Medicine

## 2023-11-16 VITALS — BP 122/78 | HR 60 | Temp 98.3°F | Ht 64.0 in | Wt 212.4 lb

## 2023-11-16 DIAGNOSIS — Z6836 Body mass index (BMI) 36.0-36.9, adult: Secondary | ICD-10-CM

## 2023-11-16 DIAGNOSIS — E66812 Obesity, class 2: Secondary | ICD-10-CM | POA: Diagnosis not present

## 2023-11-16 DIAGNOSIS — E1169 Type 2 diabetes mellitus with other specified complication: Secondary | ICD-10-CM

## 2023-11-16 DIAGNOSIS — Z0000 Encounter for general adult medical examination without abnormal findings: Secondary | ICD-10-CM

## 2023-11-16 DIAGNOSIS — E785 Hyperlipidemia, unspecified: Secondary | ICD-10-CM

## 2023-11-16 LAB — POCT URINALYSIS DIPSTICK
Bilirubin, UA: NEGATIVE
Glucose, UA: NEGATIVE
Ketones, UA: NEGATIVE
Leukocytes, UA: NEGATIVE
Nitrite, UA: NEGATIVE
Protein, UA: NEGATIVE
Spec Grav, UA: 1.015 (ref 1.010–1.025)
Urobilinogen, UA: 0.2 U/dL
pH, UA: 5.5 (ref 5.0–8.0)

## 2023-11-16 MED ORDER — ATORVASTATIN CALCIUM 10 MG PO TABS: ORAL_TABLET | ORAL | 2 refills | Status: DC

## 2023-11-16 MED ORDER — SEMAGLUTIDE (2 MG/DOSE) 8 MG/3ML ~~LOC~~ SOPN: 2.0000 mg | PEN_INJECTOR | SUBCUTANEOUS | 2 refills | Status: DC

## 2023-11-16 NOTE — Patient Instructions (Signed)

## 2023-11-16 NOTE — Assessment & Plan Note (Signed)
She is also followed by [place] [facility]. She is encouraged to strive for BMI less than 30 to decrease cardiac risk. Advised to aim for at least 150 minutes of exercise per week.

## 2023-11-16 NOTE — Assessment & Plan Note (Signed)

## 2023-11-16 NOTE — Assessment & Plan Note (Addendum)
Chronic, LDL goal < 70.  Diabetic foot exam was performed.  EKG performed, SB w/o acute changes. She is now on Ozempic 2mg  weekly as per [facility].  She is scheduled for eye exam Dec 2024, will request 2023 results. We also discussed renal protection, I plan to add valsartan 40mg  nightly. I will first review her kidney function. If started, will see her in TWO weeks for bp check and BMP f/u.  I DISCUSSED WITH THE PATIENT AT LENGTH REGARDING THE GOALS OF GLYCEMIC CONTROL AND POSSIBLE LONG-TERM COMPLICATIONS.  I  ALSO STRESSED THE IMPORTANCE OF COMPLIANCE WITH HOME GLUCOSE MONITORING, DIETARY RESTRICTIONS INCLUDING AVOIDANCE OF SUGARY DRINKS/PROCESSED FOODS,  ALONG WITH REGULAR EXERCISE.  I  ALSO STRESSED THE IMPORTANCE OF ANNUAL EYE EXAMS, SELF FOOT CARE AND COMPLIANCE WITH OFFICE VISITS.

## 2023-11-16 NOTE — Progress Notes (Signed)
I,Stephanie French, CMA,acting as a Neurosurgeon for Stephanie Aliment, MD.,have documented all relevant documentation on the behalf of Stephanie Aliment, MD,as directed by  Stephanie Aliment, MD while in the presence of Stephanie Aliment, MD.  Subjective:    Patient ID: Stephanie French , female    DOB: 15-Dec-1956 , 67 y.o.   MRN: [id number]  Chief Complaint  Patient presents with   Annual Exam   Diabetes   Hyperlipidemia    HPI  She is here today for a full physical examination. She is not followed by GYN. She is due for pap smear in 2025. She reports compliance with meds. She denies having any headaches, chest pain and shortness of breath.  She has no other concerns at this time.  She has DM eye exam scheduled for next month.    Diabetes She presents for her follow-up diabetic visit. She has type 2 diabetes mellitus. Her disease course has been stable. There are no hypoglycemic associated symptoms. Pertinent negatives for diabetes include no blurred vision, no chest pain, no polydipsia, no polyphagia and no polyuria. There are no hypoglycemic complications. Symptoms are stable. Risk factors for coronary artery disease include diabetes mellitus, obesity and post-menopausal. She participates in exercise intermittently. Her breakfast blood glucose is taken between 8-9 am. Her breakfast blood glucose range is generally 90-110 mg/dl. An ACE inhibitor/angiotensin II receptor blocker is not being taken. Eye exam is not current.  Hyperlipidemia This is a chronic problem. The current episode started more than 1 year ago. Exacerbating diseases include diabetes and obesity. Pertinent negatives include no chest pain. Current antihyperlipidemic treatment includes statins. Risk factors for coronary artery disease include diabetes mellitus, dyslipidemia, obesity and post-menopausal.     Past Medical History:  Diagnosis Date   Anemia    Anxiety    Atypical chest pain 08/31/2019   Back pain    Constipation     Depression    Diabetes mellitus    Edema of both lower extremities    GERD (gastroesophageal reflux disease)    Hyperlipidemia    Joint pain    Obesity    Pure hypercholesterolemia 12/07/2019   Shortness of breath 08/31/2019   Vitamin D deficiency      Family History  Problem Relation Age of Onset   Diabetes Mother    Hypertension Mother    Stroke Mother    Heart attack Mother    High Cholesterol Mother    Early death Father    Lung cancer Father    Diabetes Sister    Hypertension Sister    Breast cancer Maternal Grandmother    Heart attack Maternal Grandmother    Cancer Other    Hypertension Other    Hyperlipidemia Other    Stroke Other    Heart attack Other      Current Outpatient Medications:    hydrocortisone (ANUSOL-HC) 2.5 % rectal cream, Place 1 application rectally 2 (two) times daily as needed for hemorrhoids or anal itching., Disp: 30 g, Rfl: 0   meloxicam (MOBIC) 7.5 MG tablet, Take 1 tablet (7.5 mg total) by mouth 2 (two) times daily as needed for pain., Disp: 30 tablet, Rfl: 2   Multiple Vitamin (MULTIVITAMIN) capsule, Take 1 capsule by mouth daily. For 50 plus, Disp: , Rfl:    ONETOUCH VERIO test strip, USE AS INSTRUCTED TO CHECK BLOOD SUGAR 3 TIMES A DAY. DX CODE E11.65, Disp: 300 strip, Rfl: 12   atorvastatin (LIPITOR) 10 MG tablet, One  tab po qd M-F, skip Sat/Sundays, Disp: 90 tablet, Rfl: 2   Semaglutide, 2 MG/DOSE, 8 MG/3ML SOPN, Inject 2 mg as directed once a week., Disp: 9 mL, Rfl: 2   Allergies  Allergen Reactions   Egg-Derived Products Other (See Comments)    Allergic to the protein in the egg white 07/12/23 reports she can eat egg whites   Latex    Other Itching and Other (See Comments)    Other reaction(s): Unknown   Peanut-Containing Drug Products Other (See Comments)   Penicillins Other (See Comments)   Shellfish Allergy Hives, Itching, Photosensitivity and Rash   Shiitake Mushroom Hives, Itching, Photosensitivity and Rash      The  patient states she uses post menopausal status for birth control. Patient's last menstrual period was 08/12/2012.. Negative for Dysmenorrhea. Negative for: breast discharge, breast lump(s), breast pain and breast self exam. Associated symptoms include abnormal vaginal bleeding. Pertinent negatives include abnormal bleeding (hematology), anxiety, decreased libido, depression, difficulty falling sleep, dyspareunia, history of infertility, nocturia, sexual dysfunction, sleep disturbances, urinary incontinence, urinary urgency, vaginal discharge and vaginal itching. Diet regular.The patient states her exercise level is  MODERATE, STATES SHE WALKS A LOT AT WORK.  . The patient's tobacco use is:  Social History   Tobacco Use  Smoking Status Former   Current packs/day: 0.10   Average packs/day: 0.1 packs/day for 40.0 years (4.0 ttl pk-yrs)   Types: Cigarettes  Smokeless Tobacco Never  . She has been exposed to passive smoke. The patient's alcohol use is:  Social History   Substance and Sexual Activity  Alcohol Use No    Review of Systems  Constitutional: Negative.   HENT: Negative.    Eyes: Negative.  Negative for blurred vision.  Respiratory: Negative.    Cardiovascular: Negative.  Negative for chest pain.  Gastrointestinal: Negative.   Endocrine: Negative.  Negative for polydipsia, polyphagia and polyuria.  Genitourinary: Negative.   Musculoskeletal: Negative.   Skin: Negative.   Allergic/Immunologic: Negative.   Neurological: Negative.   Hematological: Negative.   Psychiatric/Behavioral: Negative.       Today's Vitals   11/16/23 0844  BP: 122/78  Pulse: 60  Temp: 98.3 F (36.8 C)  SpO2: 98%  Weight: 212 lb 6.4 oz (96.3 kg)   Body mass index is 36.46 kg/m.  Wt Readings from Last 3 Encounters:  11/16/23 212 lb 6.4 oz (96.3 kg)  10/18/23 207 lb (93.9 kg)  09/06/23 208 lb (94.3 kg)     Objective:  Physical Exam Vitals and nursing note reviewed.  Constitutional:       Appearance: Normal appearance. She is obese.  HENT:     Head: Normocephalic and atraumatic.     Right Ear: Tympanic membrane, ear canal and external ear normal.     Left Ear: Tympanic membrane, ear canal and external ear normal.     Nose: Nose normal.     Mouth/Throat:     Mouth: Mucous membranes are moist.     Pharynx: Oropharynx is clear.  Eyes:     Extraocular Movements: Extraocular movements intact.     Conjunctiva/sclera: Conjunctivae normal.     Pupils: Pupils are equal, round, and reactive to light.  Cardiovascular:     Rate and Rhythm: Normal rate and regular rhythm.     Pulses: Normal pulses.          Dorsalis pedis pulses are 2+ on the right side and 2+ on the left side.     Heart sounds: Normal heart  sounds.  Pulmonary:     Effort: Pulmonary effort is normal.     Breath sounds: Normal breath sounds.  Chest:  Breasts:    Tanner Score is 5.     Right: Normal.     Left: Normal.  Abdominal:     General: Bowel sounds are normal.     Palpations: Abdomen is soft.  Genitourinary:    Comments: deferred Musculoskeletal:        General: Normal range of motion.     Cervical back: Normal range of motion and neck supple.  Feet:     Right foot:     Protective Sensation: 5 sites tested.  5 sites sensed.     Skin integrity: Dry skin present.     Toenail Condition: Right toenails are normal.     Left foot:     Protective Sensation: 5 sites tested.  5 sites sensed.     Skin integrity: Dry skin present.     Toenail Condition: Left toenails are normal.  Skin:    General: Skin is warm and dry.  Neurological:     General: No focal deficit present.     Mental Status: She is alert and oriented to person, place, and time.  Psychiatric:        Mood and Affect: Mood normal.        Behavior: Behavior normal.         Assessment And Plan:     Encounter for general adult medical examination w/o abnormal findings Assessment & Plan: A full exam was performed.  Importance of  monthly self breast exams was discussed with the patient.  She is advised to get 30-45 minutes of regular exercise, no less than four to five days per week. Both weight-bearing and aerobic exercises are recommended.  She is advised to follow a healthy diet with at least six fruits/veggies per day, decrease intake of red meat and other saturated fats and to increase fish intake to twice weekly.  Meats/fish should not be fried -- baked, boiled or broiled is preferable. It is also important to cut back on your sugar intake.  Be sure to read labels - try to avoid anything with added sugar, high fructose corn syrup or other sweeteners.  If you must use a sweetener, you can try stevia or monkfruit.  It is also important to avoid artificially sweetened foods/beverages and diet drinks. Lastly, wear SPF 50 sunscreen on exposed skin and when in direct sunlight for an extended period of time.  Be sure to avoid fast food restaurants and aim for at least 60 ounces of water daily.      Orders: -     CBC -     CMP14+EGFR -     Hemoglobin A1c -     TSH -     Iron, TIBC and Ferritin Panel  Dyslipidemia associated with type 2 diabetes mellitus (HCC) Assessment & Plan: Chronic, LDL goal < 70.  Diabetic foot exam was performed.  EKG performed, SB w/o acute changes. She is now on Ozempic 2mg  weekly as per [facility].  She is scheduled for eye exam Dec 2024, will request 2023 results. We also discussed renal protection, I plan to add valsartan 40mg  nightly. I will first review her kidney function. If started, will see her in TWO weeks for bp check and BMP f/u.  I DISCUSSED WITH THE PATIENT AT LENGTH REGARDING THE GOALS OF GLYCEMIC CONTROL AND POSSIBLE LONG-TERM COMPLICATIONS.  I  ALSO STRESSED  THE IMPORTANCE OF COMPLIANCE WITH HOME GLUCOSE MONITORING, DIETARY RESTRICTIONS INCLUDING AVOIDANCE OF SUGARY DRINKS/PROCESSED FOODS,  ALONG WITH REGULAR EXERCISE.  I  ALSO STRESSED THE IMPORTANCE OF ANNUAL EYE EXAMS, SELF FOOT CARE AND  COMPLIANCE WITH OFFICE VISITS.   Orders: -     POCT urinalysis dipstick -     Microalbumin / creatinine urine ratio -     EKG 12-Lead  Class 2 severe obesity due to excess calories with serious comorbidity and body mass index (BMI) of 36.0 to 36.9 in adult [facility]) Assessment & Plan: She is also followed by [facility]. She is encouraged to strive for BMI less than 30 to decrease cardiac risk. Advised to aim for at least 150 minutes of exercise per week.    Other orders -     Semaglutide (2 MG/DOSE); Inject 2 mg as directed once a week.  Dispense: 9 mL; Refill: 2 -     Atorvastatin Calcium; One tab po qd M-F, skip Sat/Sundays  Dispense: 90 tablet; Refill: 2  She is encouraged to strive for BMI less than 30 to decrease cardiac risk. Advised to aim for at least 150 minutes of exercise per week.    Return for 1 year HM, 4 month bp& dm . Patient was given opportunity to ask questions. Patient verbalized understanding of the plan and was able to repeat key elements of the plan. All questions were answered to their satisfaction.   I, Stephanie Aliment, MD, have reviewed all documentation for this visit. The documentation on 11/16/23 for the exam, diagnosis, procedures, and orders are all accurate and complete.

## 2023-11-17 ENCOUNTER — Encounter: Payer: Self-pay | Admitting: Internal Medicine

## 2023-11-17 LAB — CMP14+EGFR
ALT: 14 [IU]/L (ref 0–32)
AST: 19 [IU]/L (ref 0–40)
Albumin: 4.1 g/dL (ref 3.9–4.9)
Alkaline Phosphatase: 84 [IU]/L (ref 44–121)
BUN/Creatinine Ratio: 18 (ref 12–28)
BUN: 16 mg/dL (ref 8–27)
Bilirubin Total: 0.3 mg/dL (ref 0.0–1.2)
CO2: 21 mmol/L (ref 20–29)
Calcium: 9.3 mg/dL (ref 8.7–10.3)
Chloride: 104 mmol/L (ref 96–106)
Creatinine, Ser: 0.87 mg/dL (ref 0.57–1.00)
Globulin, Total: 2.5 g/dL (ref 1.5–4.5)
Glucose: 74 mg/dL (ref 70–99)
Potassium: 4.9 mmol/L (ref 3.5–5.2)
Sodium: 137 mmol/L (ref 134–144)
Total Protein: 6.6 g/dL (ref 6.0–8.5)
eGFR: 74 mL/min/{1.73_m2} (ref >59)

## 2023-11-17 LAB — IRON,TIBC AND FERRITIN PANEL
Ferritin: 70 ng/mL (ref 15–150)
Iron Saturation: 20 % (ref 15–55)
Iron: 64 ug/dL (ref 27–139)
Total Iron Binding Capacity: 326 ug/dL (ref 250–450)
UIBC: 262 ug/dL (ref 118–369)

## 2023-11-17 LAB — MICROALBUMIN / CREATININE URINE RATIO
Creatinine, Urine: 27.7 mg/dL
Microalb/Creat Ratio: <11 mg/g{creat} (ref 0–29)
Microalbumin, Urine: <3 ug/mL

## 2023-11-17 LAB — CBC
Hematocrit: 39 % (ref 34.0–46.6)
Hemoglobin: 12 g/dL (ref 11.1–15.9)
MCH: 22.4 pg — ABNORMAL LOW (ref 26.6–33.0)
MCHC: 30.8 g/dL — ABNORMAL LOW (ref 31.5–35.7)
MCV: 73 fL — ABNORMAL LOW (ref 79–97)
Platelets: 207 10*3/uL (ref 150–450)
RBC: 5.36 x10E6/uL — ABNORMAL HIGH (ref 3.77–5.28)
RDW: 16.1 % — ABNORMAL HIGH (ref 11.7–15.4)
WBC: 4.3 10*3/uL (ref 3.4–10.8)

## 2023-11-17 LAB — HEMOGLOBIN A1C
Est. average glucose Bld gHb Est-mCnc: 128 mg/dL
Hgb A1c MFr Bld: 6.1 % — ABNORMAL HIGH (ref 4.8–5.6)

## 2023-11-17 LAB — TSH: TSH: 1.72 u[IU]/mL (ref 0.450–4.500)

## 2023-11-22 ENCOUNTER — Encounter: Payer: Self-pay | Admitting: Family Medicine

## 2023-11-22 ENCOUNTER — Ambulatory Visit: Payer: No Typology Code available for payment source | Admitting: Family Medicine

## 2023-11-22 VITALS — BP 131/82 | HR 80 | Temp 97.8°F | Ht 64.0 in | Wt 208.0 lb

## 2023-11-22 DIAGNOSIS — E1169 Type 2 diabetes mellitus with other specified complication: Secondary | ICD-10-CM | POA: Diagnosis not present

## 2023-11-22 DIAGNOSIS — Z6835 Body mass index (BMI) 35.0-35.9, adult: Secondary | ICD-10-CM | POA: Diagnosis not present

## 2023-11-22 DIAGNOSIS — Z7985 Long-term (current) use of injectable non-insulin antidiabetic drugs: Secondary | ICD-10-CM

## 2023-11-22 DIAGNOSIS — E66812 Obesity, class 2: Secondary | ICD-10-CM | POA: Diagnosis not present

## 2023-11-22 NOTE — Assessment & Plan Note (Signed)
Management of type II diabetes UTD with Dr Allyne Gee Tolerating dose increase of Ozempic to 2 mg weekly without adverse SE Denies hypoglycemia or GI intolerance Staying active at work  Continue Ozempic 2 mg weekly (refills provided by PCP) Begin weight training 2-3 x a week from home to improve insulin sensitivity Avoid high sugar foods and drinks

## 2023-11-22 NOTE — Assessment & Plan Note (Addendum)
Reviewed overall progress and goals with patient She has a net weight loss of 5 lb in 2 years of medically supervised weight management which is a 2.3% TBW loss % body fat currently 46 with a VFR of 14. Reviewed bioimpedence results with patient To achieve improvements in metabolic health, achieving a % body fat <35 and a VFR <10 would be beneficial.   Patient's personal weight goal is ~200 lb which will not quite match with these targeted goals.  Consider adding in more formal exercise at [facility] and visiting with an RD in the new year.

## 2023-11-22 NOTE — Progress Notes (Signed)
Office: [phone]  /  Fax: [phone]  WEIGHT SUMMARY AND BIOMETRICS  Starting Date: 09/03/21  Starting Weight: 200lb   Weight Lost Since Last Visit: 0lb   Vitals Temp: 97.8 F (36.6 C) BP: 131/82 Pulse Rate: 80 SpO2: 97 %   Body Composition  Body Fat %: 46.3 % Fat Mass (lbs): 96.4 lbs Muscle Mass (lbs): 106 lbs Total Body Water (lbs): 76 lbs Visceral Fat Rating : 14    HPI  Chief Complaint: OBESITY  Stephanie French is here to discuss her progress with her obesity treatment plan. She is on the the Category 2 Plan and states she is following her eating plan approximately 40 % of the time. She states she is exercising 0 minutes 0 times per week.   Interval History:  Since last office visit she is up 1 lb This gives her a net weight loss of 5 lb in 2 years She did go up on Ozempic to 2 mg dose 1 month ago Hunger and cravings have improved without skipping meals Plans to work on meal prep Tracking steps and averages 10,000-15,000 on work days Food noise has quieted down with the rise in Ozempic dosage Denies N/V/C/ heartburn She plans to start using free weights at home She would like to be ~200 lb in the next 3 mos  Pharmacotherapy: Ozmepic 2 mg weekly  PHYSICAL EXAM:  Blood pressure 131/82, pulse 80, temperature 97.8 F (36.6 C), height 5\' 4"  (1.626 m), weight 208 lb (94.3 kg), last menstrual period 08/12/2012, SpO2 97%. Body mass index is 35.7 kg/m.  General: She is overweight, cooperative, alert, well developed, and in no acute distress. PSYCH: Has normal mood, affect and thought process.   Lungs: Normal breathing effort, no conversational dyspnea.   ASSESSMENT AND PLAN  TREATMENT PLAN FOR OBESITY:  Recommended Dietary Goals  Juliete is currently in the action stage of change. As such, her goal is to continue weight management plan. She has agreed to keeping a food journal and adhering to recommended goals of 1400 calories and 85+ g of   protein.  Behavioral Intervention  We discussed the following Behavioral Modification Strategies today: increasing lean protein intake to established goals, decreasing simple carbohydrates , increasing vegetables, increasing lower glycemic fruits, increasing fiber rich foods, avoiding skipping meals, increasing water intake , work on meal planning and preparation, work on Counselling psychologist calories using tracking application, keeping healthy foods at home, planning for success, and continue to work on maintaining a reduced calorie state, getting the recommended amount of protein, incorporating whole foods, making healthy choices, staying well hydrated and practicing mindfulness when eating..  Additional resources provided today: NA  Recommended Physical Activity Goals  Felisha has been advised to work up to 150 minutes of moderate intensity aerobic activity a week and strengthening exercises 2-3 times per week for cardiovascular health, weight loss maintenance and preservation of muscle mass.   She has agreed to [employer] strengthening exercises with a goal of 2-3 sessions a week   Pharmacotherapy changes for the treatment of obesity: none  ASSOCIATED CONDITIONS ADDRESSED TODAY  Type 2 diabetes mellitus with other specified complication, without long-term current use of insulin (HCC) Assessment & Plan: Management of type II diabetes UTD with Dr Allyne Gee Tolerating dose increase of Ozempic to 2 mg weekly without adverse SE Denies hypoglycemia or GI intolerance Staying active at work  Continue Ozempic 2 mg weekly (refills provided by PCP) Begin weight training 2-3 x a week from home to improve insulin  sensitivity Avoid high sugar foods and drinks   Class 2 severe obesity due to excess calories with serious comorbidity and body mass index (BMI) of 35.0 to 35.9 in adult [facility]) Assessment & Plan: Reviewed overall progress and goals with patient She has a net weight loss of 5 lb in 2 years  of medically supervised weight management which is a 2.3% TBW loss % body fat currently 46 with a VFR of 14. Reviewed bioimpedence results with patient To achieve improvements in metabolic health, achieving a % body fat <35 and a VFR <10 would be beneficial.   Patient's personal weight goal is ~200 lb which will not quite match with these targeted goals.  Consider adding in more formal exercise at [facility] and visiting with an RD in the new year.       She was informed of the importance of frequent follow up visits to maximize her success with intensive lifestyle modifications for her multiple health conditions.   ATTESTASTION STATEMENTS:  Reviewed by clinician on day of visit: allergies, medications, problem list, medical history, surgical history, family history, social history, and previous encounter notes pertinent to obesity diagnosis.   I have personally spent 30 minutes total time today in preparation, patient care, nutritional counseling and documentation for this visit, including the following: review of clinical lab tests; review of medical tests/procedures/services.      Glennis Brink, DO DABFM, DABOM [facility] [address] [place], [place] [address] [phone]

## 2023-11-23 LAB — SPECIMEN STATUS REPORT

## 2023-11-23 LAB — IRON,TIBC AND FERRITIN PANEL
Ferritin: 70 ng/mL (ref 15–150)
Iron Saturation: 21 % (ref 15–55)
Iron: 62 ug/dL (ref 27–139)
Total Iron Binding Capacity: 297 ug/dL (ref 250–450)
UIBC: 235 ug/dL (ref 118–369)

## 2023-12-13 ENCOUNTER — Ambulatory Visit
Admission: RE | Admit: 2023-12-13 | Discharge: 2023-12-13 | Disposition: A | Discharge status: Home / Self Care | Payer: No Typology Code available for payment source | Source: Ambulatory Visit | Attending: Internal Medicine | Admitting: Internal Medicine

## 2023-12-13 DIAGNOSIS — Z1231 Encounter for screening mammogram for malignant neoplasm of breast: Secondary | ICD-10-CM

## 2023-12-19 LAB — HM DIABETES EYE EXAM

## 2023-12-27 ENCOUNTER — Ambulatory Visit: Payer: No Typology Code available for payment source | Admitting: Bariatrics

## 2023-12-27 ENCOUNTER — Encounter (INDEPENDENT_AMBULATORY_CARE_PROVIDER_SITE_OTHER): Payer: Self-pay

## 2024-01-19 ENCOUNTER — Other Ambulatory Visit: Payer: Self-pay

## 2024-01-19 MED ORDER — SEMAGLUTIDE (2 MG/DOSE) 8 MG/3ML ~~LOC~~ SOPN: 2.0000 mg | PEN_INJECTOR | SUBCUTANEOUS | 2 refills | Status: DC

## 2024-02-07 ENCOUNTER — Other Ambulatory Visit: Payer: Self-pay | Admitting: Internal Medicine

## 2024-02-10 ENCOUNTER — Other Ambulatory Visit: Payer: Self-pay | Admitting: Internal Medicine

## 2024-02-10 DIAGNOSIS — R3129 Other microscopic hematuria: Secondary | ICD-10-CM

## 2024-02-18 ENCOUNTER — Ambulatory Visit
Admission: RE | Admit: 2024-02-18 | Discharge: 2024-02-18 | Disposition: A | Discharge status: Home / Self Care | Payer: No Typology Code available for payment source | Source: Ambulatory Visit | Attending: Internal Medicine | Admitting: Internal Medicine

## 2024-02-18 DIAGNOSIS — R3129 Other microscopic hematuria: Secondary | ICD-10-CM

## 2024-03-06 ENCOUNTER — Encounter: Payer: Self-pay | Admitting: Internal Medicine

## 2024-03-08 ENCOUNTER — Ambulatory Visit: Payer: Self-pay | Admitting: Family Medicine

## 2024-03-15 ENCOUNTER — Ambulatory Visit: Payer: No Typology Code available for payment source | Admitting: Internal Medicine

## 2024-03-15 VITALS — Temp 98.5°F | Ht 64.0 in | Wt 208.0 lb

## 2024-03-15 DIAGNOSIS — R718 Other abnormality of red blood cells: Secondary | ICD-10-CM

## 2024-03-15 DIAGNOSIS — N6342 Unspecified lump in left breast, subareolar: Secondary | ICD-10-CM | POA: Diagnosis not present

## 2024-03-15 DIAGNOSIS — E66812 Obesity, class 2: Secondary | ICD-10-CM

## 2024-03-15 DIAGNOSIS — Z6835 Body mass index (BMI) 35.0-35.9, adult: Secondary | ICD-10-CM

## 2024-03-15 DIAGNOSIS — E1169 Type 2 diabetes mellitus with other specified complication: Secondary | ICD-10-CM

## 2024-03-15 DIAGNOSIS — N29 Other disorders of kidney and ureter in diseases classified elsewhere: Secondary | ICD-10-CM

## 2024-03-15 DIAGNOSIS — E785 Hyperlipidemia, unspecified: Secondary | ICD-10-CM

## 2024-03-15 MED ORDER — SEMAGLUTIDE (2 MG/DOSE) 8 MG/3ML ~~LOC~~ SOPN: 2.0000 mg | PEN_INJECTOR | SUBCUTANEOUS | 2 refills | Status: DC

## 2024-03-15 NOTE — Patient Instructions (Addendum)

## 2024-03-15 NOTE — Progress Notes (Unsigned)
 I,Stephanie French, CMA,acting as a Neurosurgeon for Stephanie Aliment, MD.,have documented all relevant documentation on the behalf of Stephanie Aliment, MD,as directed by  Stephanie Aliment, MD while in the presence of Stephanie Aliment, MD.  Subjective:  Patient ID: Stephanie French , female    DOB: Jan 26, 1956 , 68 y.o.   MRN: [id number]  Chief Complaint  Patient presents with   Diabetes    HPI  She presents today for DM check. She reports compliance with meds. She denies headaches, chest pain and shortness of breath.   Patient has no questions or concerns.  Diabetes She presents for her follow-up diabetic visit. She has type 2 diabetes mellitus. There are no hypoglycemic associated symptoms. Pertinent negatives for diabetes include no blurred vision, no chest pain, no polydipsia, no polyphagia and no polyuria. There are no hypoglycemic complications. Symptoms are stable. Risk factors for coronary artery disease include diabetes mellitus, obesity and post-menopausal. Her breakfast blood glucose is taken between 8-9 am. Her breakfast blood glucose range is generally 90-110 mg/dl. An ACE inhibitor/angiotensin II receptor blocker is not being taken.  Hyperlipidemia This is a chronic problem. The current episode started more than 1 year ago. Exacerbating diseases include diabetes and obesity. Pertinent negatives include no chest pain. Current antihyperlipidemic treatment includes statins. Risk factors for coronary artery disease include diabetes mellitus, dyslipidemia, obesity and post-menopausal.     Past Medical History:  Diagnosis Date   Anemia    Anxiety    Atypical chest pain 08/31/2019   Back pain    Constipation    Depression    Diabetes mellitus    Edema of both lower extremities    GERD (gastroesophageal reflux disease)    Hyperlipidemia    Joint pain    Obesity    Pure hypercholesterolemia 12/07/2019   Shortness of breath 08/31/2019   Vitamin D deficiency      Family History   Problem Relation Age of Onset   Diabetes Mother    Hypertension Mother    Stroke Mother    Heart attack Mother    High Cholesterol Mother    Early death Father    Lung cancer Father    Diabetes Sister    Hypertension Sister    Breast cancer Maternal Grandmother    Heart attack Maternal Grandmother    Cancer Other    Hypertension Other    Hyperlipidemia Other    Stroke Other    Heart attack Other      Current Outpatient Medications:    atorvastatin (LIPITOR) 10 MG tablet, One tab po qd M-F, skip Sat/Sundays, Disp: 90 tablet, Rfl: 2   hydrocortisone (ANUSOL-HC) 2.5 % rectal cream, Place 1 application rectally 2 (two) times daily as needed for hemorrhoids or anal itching., Disp: 30 g, Rfl: 0   meloxicam (MOBIC) 7.5 MG tablet, Take 1 tablet (7.5 mg total) by mouth 2 (two) times daily as needed for pain., Disp: 30 tablet, Rfl: 2   Multiple Vitamin (MULTIVITAMIN) capsule, Take 1 capsule by mouth daily. For 50 plus, Disp: , Rfl:    ONETOUCH VERIO test strip, USE AS INSTRUCTED TO CHECK BLOOD SUGAR 3 TIMES A DAY. DX CODE E11.65, Disp: 300 strip, Rfl: 12   Semaglutide, 2 MG/DOSE, 8 MG/3ML SOPN, Inject 2 mg as directed once a week., Disp: 9 mL, Rfl: 2   Allergies  Allergen Reactions   Egg-Derived Products Other (See Comments)    Allergic to the protein in the egg white 07/12/23  reports she can eat egg whites   Latex    Other Itching and Other (See Comments)    Other reaction(s): Unknown   Peanut-Containing Drug Products Other (See Comments)   Penicillins Other (See Comments)   Shellfish Allergy Hives, Itching, Photosensitivity and Rash   Shiitake Mushroom (Obsolete) Hives, Itching, Photosensitivity and Rash     Review of Systems  Constitutional: Negative.   Eyes:  Negative for blurred vision.  Respiratory: Negative.    Cardiovascular: Negative.  Negative for chest pain.  Gastrointestinal: Negative.   Endocrine: Negative for polydipsia, polyphagia and polyuria.  Neurological:  Negative.   Psychiatric/Behavioral: Negative.       Today's Vitals   03/15/24 1528  Temp: 98.5 F (36.9 C)  Weight: 208 lb (94.3 kg)  Height: 5\' 4"  (1.626 m)  PainSc: 0-No pain   Body mass index is 35.7 kg/m.  Wt Readings from Last 3 Encounters:  03/15/24 208 lb (94.3 kg)  11/22/23 208 lb (94.3 kg)  11/16/23 212 lb 6.4 oz (96.3 kg)    BP Readings from Last 3 Encounters:  11/22/23 131/82  11/16/23 122/78  10/18/23 127/79    Objective:  Physical Exam Vitals and nursing note reviewed.  Constitutional:      Appearance: Normal appearance. She is obese.  HENT:     Head: Normocephalic and atraumatic.  Eyes:     Extraocular Movements: Extraocular movements intact.  Cardiovascular:     Rate and Rhythm: Normal rate and regular rhythm.     Heart sounds: Normal heart sounds.  Pulmonary:     Effort: Pulmonary effort is normal.     Breath sounds: Normal breath sounds.  Chest:  Breasts:    Left: Mass and skin change present.       Comments: Subareolar mass, posterior to nipple Crusting of nipple Musculoskeletal:     Cervical back: Normal range of motion.  Skin:    General: Skin is warm.  Neurological:     General: No focal deficit present.     Mental Status: She is alert.  Psychiatric:        Mood and Affect: Mood normal.        Behavior: Behavior normal.         Assessment And Plan:  Dyslipidemia associated with type 2 diabetes mellitus (HCC) Assessment & Plan: Chronic, LDL goal < 70.  She will continue with atorvastatin 10mg  M-F.  She is now on Ozempic 2mg  weekly as per [facility] clinic.  e also discussed renal protection, I plan to add valsartan 40mg  nightly. I will first review her kidney function. If started, will see her in TWO weeks for bp check and BMP f/u.    Orders: -     CMP14+EGFR -     Hemoglobin A1c  Subareolar mass of left breast Assessment & Plan: She agrees to diagnostic mammo and ultrasound for further evaluation.   Orders: -     MM 3D  DIAGNOSTIC MAMMOGRAM UNILATERAL LEFT BREAST; Future -     [place] LIMITED ULTRASOUND INCLUDING AXILLA LEFT BREAST ; Future  Nephrocalcinosis Assessment & Plan: Seen on recent Renal ultrasound. I will refer her to Urology to determine if further testing is needed.   Orders: -     Ambulatory referral to Urology  Low mean corpuscular volume (MCV) -     Iron, TIBC and Ferritin Panel -     CBC  Class 2 severe obesity due to excess calories with serious comorbidity and body mass index (BMI) of  35.0 to 35.9 in adult [facility]) Assessment & Plan: She is also followed by [facility]. She is encouraged to incorporate strength training into her exercise routine.  Unfortunately, her knee pain has impacted her ability to exercise regularly. She should consider aqua exercises that will have less impact on her joints.    Other orders -     Semaglutide (2 MG/DOSE); Inject 2 mg as directed once a week.  Dispense: 9 mL; Refill: 2  She is encouraged to strive for BMI less than 30 to decrease cardiac risk. Advised to aim for at least 150 minutes of exercise per week.    Return in about 3 months (around 06/15/2024), or dm check.  Patient was given opportunity to ask questions. Patient verbalized understanding of the plan and was able to repeat key elements of the plan. All questions were answered to their satisfaction.    I, Stephanie Aliment, MD, have reviewed all documentation for this visit. The documentation on 03/15/24 for the exam, diagnosis, procedures, and orders are all accurate and complete.   IF YOU HAVE BEEN REFERRED TO A SPECIALIST, IT MAY TAKE 1-2 WEEKS TO SCHEDULE/PROCESS THE REFERRAL. IF YOU HAVE NOT HEARD FROM US/SPECIALIST IN TWO WEEKS, PLEASE GIVE [place] A CALL AT [phone].   THE PATIENT IS ENCOURAGED TO PRACTICE SOCIAL DISTANCING DUE TO THE COVID-19 PANDEMIC.

## 2024-03-16 LAB — CMP14+EGFR
ALT: 12 IU/L (ref 0–32)
AST: 20 IU/L (ref 0–40)
Albumin: 4.2 g/dL (ref 3.9–4.9)
Alkaline Phosphatase: 73 IU/L (ref 44–121)
BUN/Creatinine Ratio: 19 (ref 12–28)
BUN: 14 mg/dL (ref 8–27)
Bilirubin Total: 0.3 mg/dL (ref 0.0–1.2)
CO2: 25 mmol/L (ref 20–29)
Calcium: 9.5 mg/dL (ref 8.7–10.3)
Chloride: 102 mmol/L (ref 96–106)
Creatinine, Ser: 0.72 mg/dL (ref 0.57–1.00)
Globulin, Total: 2.6 g/dL (ref 1.5–4.5)
Glucose: 75 mg/dL (ref 70–99)
Potassium: 4.7 mmol/L (ref 3.5–5.2)
Sodium: 140 mmol/L (ref 134–144)
Total Protein: 6.8 g/dL (ref 6.0–8.5)
eGFR: 92 mL/min/{1.73_m2} (ref >59)

## 2024-03-16 LAB — CBC
Hematocrit: 36.3 % (ref 34.0–46.6)
Hemoglobin: 11.3 g/dL (ref 11.1–15.9)
MCH: 22.6 pg — ABNORMAL LOW (ref 26.6–33.0)
MCHC: 31.1 g/dL — ABNORMAL LOW (ref 31.5–35.7)
MCV: 73 fL — ABNORMAL LOW (ref 79–97)
Platelets: 229 10*3/uL (ref 150–450)
RBC: 4.99 x10E6/uL (ref 3.77–5.28)
RDW: 16.7 % — ABNORMAL HIGH (ref 11.7–15.4)
WBC: 4.2 10*3/uL (ref 3.4–10.8)

## 2024-03-16 LAB — IRON,TIBC AND FERRITIN PANEL
Ferritin: 98 ng/mL (ref 15–150)
Iron Saturation: 19 % (ref 15–55)
Iron: 58 ug/dL (ref 27–139)
Total Iron Binding Capacity: 302 ug/dL (ref 250–450)
UIBC: 244 ug/dL (ref 118–369)

## 2024-03-16 LAB — HEMOGLOBIN A1C
Est. average glucose Bld gHb Est-mCnc: 117 mg/dL
Hgb A1c MFr Bld: 5.7 % — ABNORMAL HIGH (ref 4.8–5.6)

## 2024-03-20 ENCOUNTER — Encounter: Payer: Self-pay | Admitting: Internal Medicine

## 2024-03-21 DIAGNOSIS — N6342 Unspecified lump in left breast, subareolar: Secondary | ICD-10-CM | POA: Insufficient documentation

## 2024-03-21 NOTE — Assessment & Plan Note (Signed)
 She agrees to diagnostic mammo and ultrasound for further evaluation.

## 2024-03-21 NOTE — Assessment & Plan Note (Signed)
 Chronic, LDL goal < 70.  She will continue with atorvastatin 10mg  M-F.  She is now on Ozempic 2mg  weekly as per [facility].  e also discussed renal protection, I plan to add valsartan 40mg  nightly. I will first review her kidney function. If started, will see her in TWO weeks for bp check and BMP f/u.

## 2024-03-21 NOTE — Assessment & Plan Note (Signed)
 Seen on recent Renal ultrasound. I will refer her to Urology to determine if further testing is needed.

## 2024-03-21 NOTE — Assessment & Plan Note (Signed)
 She is also followed by [facility]. She is encouraged to incorporate strength training into her exercise routine.  Unfortunately, her knee pain has impacted her ability to exercise regularly. She should consider aqua exercises that will have less impact on her joints.

## 2024-03-22 ENCOUNTER — Other Ambulatory Visit: Payer: Self-pay

## 2024-03-22 ENCOUNTER — Telehealth: Payer: Self-pay

## 2024-03-22 DIAGNOSIS — E1169 Type 2 diabetes mellitus with other specified complication: Secondary | ICD-10-CM

## 2024-03-22 DIAGNOSIS — Z79899 Other long term (current) drug therapy: Secondary | ICD-10-CM

## 2024-03-22 MED ORDER — VALSARTAN 40 MG PO TABS: 40.0000 mg | ORAL_TABLET | Freq: Every day | ORAL | 1 refills | Status: DC

## 2024-03-22 MED ORDER — DEXCOM G7 SENSOR MISC
2 refills | Status: DC
Start: 2024-03-22 — End: 2024-05-22

## 2024-03-22 NOTE — Telephone Encounter (Signed)
 PT PICKED UP SAMPLE OF OZEMPIC

## 2024-03-24 ENCOUNTER — Ambulatory Visit
Admission: RE | Admit: 2024-03-24 | Discharge: 2024-03-24 | Disposition: A | Discharge status: Home / Self Care | Source: Ambulatory Visit | Attending: Internal Medicine | Admitting: Internal Medicine

## 2024-03-24 DIAGNOSIS — N6342 Unspecified lump in left breast, subareolar: Secondary | ICD-10-CM

## 2024-03-25 ENCOUNTER — Encounter: Payer: Self-pay | Admitting: Internal Medicine

## 2024-03-27 ENCOUNTER — Other Ambulatory Visit: Payer: Self-pay | Admitting: Internal Medicine

## 2024-03-27 DIAGNOSIS — N6320 Unspecified lump in the left breast, unspecified quadrant: Secondary | ICD-10-CM

## 2024-04-13 ENCOUNTER — Other Ambulatory Visit: Payer: Self-pay | Admitting: Internal Medicine

## 2024-05-21 ENCOUNTER — Other Ambulatory Visit: Payer: Self-pay | Admitting: Internal Medicine

## 2024-05-21 DIAGNOSIS — E1169 Type 2 diabetes mellitus with other specified complication: Secondary | ICD-10-CM

## 2024-05-25 ENCOUNTER — Other Ambulatory Visit: Payer: Self-pay

## 2024-05-25 DIAGNOSIS — E1169 Type 2 diabetes mellitus with other specified complication: Secondary | ICD-10-CM

## 2024-05-25 MED ORDER — DEXCOM G7 SENSOR MISC: 1 refills | Status: DC

## 2024-05-29 ENCOUNTER — Other Ambulatory Visit: Payer: Self-pay | Admitting: Internal Medicine

## 2024-05-29 DIAGNOSIS — E1169 Type 2 diabetes mellitus with other specified complication: Secondary | ICD-10-CM

## 2024-06-05 ENCOUNTER — Other Ambulatory Visit: Payer: Self-pay | Admitting: Internal Medicine

## 2024-06-05 DIAGNOSIS — E1169 Type 2 diabetes mellitus with other specified complication: Secondary | ICD-10-CM

## 2024-06-13 ENCOUNTER — Ambulatory Visit: Admitting: Internal Medicine

## 2024-06-13 VITALS — BP 120/80 | HR 72 | Temp 98.1°F | Ht 64.0 in | Wt 198.6 lb

## 2024-06-13 DIAGNOSIS — E6609 Other obesity due to excess calories: Secondary | ICD-10-CM

## 2024-06-13 DIAGNOSIS — E1169 Type 2 diabetes mellitus with other specified complication: Secondary | ICD-10-CM | POA: Diagnosis not present

## 2024-06-13 DIAGNOSIS — N6002 Solitary cyst of left breast: Secondary | ICD-10-CM | POA: Diagnosis not present

## 2024-06-13 DIAGNOSIS — E785 Hyperlipidemia, unspecified: Secondary | ICD-10-CM | POA: Diagnosis not present

## 2024-06-13 DIAGNOSIS — Z6834 Body mass index (BMI) 34.0-34.9, adult: Secondary | ICD-10-CM

## 2024-06-13 DIAGNOSIS — E66811 Obesity, class 1: Secondary | ICD-10-CM | POA: Diagnosis not present

## 2024-06-13 NOTE — Patient Instructions (Signed)

## 2024-06-13 NOTE — Progress Notes (Signed)
 I,Jameka J Llittleton, CMA,acting as a Neurosurgeon for Catheryn LOISE Slocumb, MD.,have documented all relevant documentation on the behalf of Catheryn LOISE Slocumb, MD,as directed by  Catheryn LOISE Slocumb, MD while in the presence of Catheryn LOISE Slocumb, MD.  Subjective:  Patient ID: Stephanie French , female    DOB: 1956-02-08 , 68 y.o.   MRN: [id number]  Chief Complaint  Patient presents with   Diabetes    She presents today for DM check. She reports compliance with meds. She denies headaches, chest pain and shortness of breath.   Patient has no questions or concerns.    HPI Discussed the use of AI scribe software for clinical note transcription with the patient, who gave verbal consent to proceed.  History of Present Illness Stephanie French is a 68 year old female with diabetes who presents for a diabetes check.  She is currently unable to monitor her blood glucose levels due to the unavailability of the Dexcom device, which has been out of stock at [facility] and [facility] for about a month. She is familiar with using a regular meter as an alternative.  Her current medications include valsartan  40 mg daily, Ozempic  2 mg weekly, meloxicam  as needed, and atorvastatin  10 mg daily. She has not needed to take meloxicam  recently.  She has an upcoming appointment with a breast specialist on July 7th due to ongoing issues with fluid discharge from her breast. She describes an episode where she woke up with a handful of blood. The issue seems to heal and then flare up again. Another clinician suspects it to be a cyst.   Diabetes She presents for her follow-up diabetic visit. She has type 2 diabetes mellitus. There are no hypoglycemic associated symptoms. Pertinent negatives for diabetes include no blurred vision, no chest pain, no polydipsia, no polyphagia and no polyuria. There are no hypoglycemic complications. Symptoms are stable. Risk factors for coronary artery disease include diabetes mellitus, obesity and post-menopausal. Her  breakfast blood glucose is taken between 8-9 am. Her breakfast blood glucose range is generally 90-110 mg/dl. An ACE inhibitor/angiotensin II receptor blocker is not being taken.  Hyperlipidemia This is a chronic problem. The current episode started more than 1 year ago. Exacerbating diseases include diabetes and obesity. Pertinent negatives include no chest pain. Current antihyperlipidemic treatment includes statins. Risk factors for coronary artery disease include diabetes mellitus, dyslipidemia, obesity and post-menopausal.     Past Medical History:  Diagnosis Date   Anemia    Anxiety    Atypical chest pain 08/31/2019   Back pain    Constipation    Depression    Diabetes mellitus    Edema of both lower extremities    GERD (gastroesophageal reflux disease)    Hyperlipidemia    Joint pain    Obesity    Pure hypercholesterolemia 12/07/2019   Shortness of breath 08/31/2019   Vitamin D  deficiency      Family History  Problem Relation Age of Onset   Diabetes Mother    Hypertension Mother    Stroke Mother    Heart attack Mother    High Cholesterol Mother    Early death Father    Lung cancer Father    Diabetes Sister    Hypertension Sister    Breast cancer Maternal Grandmother    Heart attack Maternal Grandmother    Cancer Other    Hypertension Other    Hyperlipidemia Other    Stroke Other    Heart attack Other  Current Outpatient Medications:    atorvastatin  (LIPITOR) 10 MG tablet, One tab po qd M-F, skip Sat/Sundays, Disp: 90 tablet, Rfl: 2   Continuous Glucose Sensor (DEXCOM G7 SENSOR) MISC, USE AS DIRECTED TO CHECK BLOOD SUGARS, Disp: 3 each, Rfl: 1   hydrocortisone  (ANUSOL -HC) 2.5 % rectal cream, Place 1 application rectally 2 (two) times daily as needed for hemorrhoids or anal itching., Disp: 30 g, Rfl: 0   meloxicam  (MOBIC ) 7.5 MG tablet, Take 1 tablet (7.5 mg total) by mouth 2 (two) times daily as needed for pain., Disp: 30 tablet, Rfl: 2   Multiple Vitamin  (MULTIVITAMIN) capsule, Take 1 capsule by mouth daily. For 50 plus, Disp: , Rfl:    ONETOUCH VERIO test strip, USE AS INSTRUCTED TO CHECK BLOOD SUGAR 3 TIMES A DAY. DX CODE E11.65, Disp: 300 strip, Rfl: 12   Semaglutide , 2 MG/DOSE, 8 MG/3ML SOPN, Inject 2 mg as directed once a week., Disp: 9 mL, Rfl: 2   valsartan  (DIOVAN ) 40 MG tablet, TAKE 1 TABLET BY MOUTH EVERY DAY, Disp: 90 tablet, Rfl: 2   Allergies  Allergen Reactions   Egg-Derived Products Other (See Comments)    Allergic to the protein in the egg white 07/12/23 reports she can eat egg whites   Latex    Other Itching and Other (See Comments)    Other reaction(s): Unknown   Peanut-Containing Drug Products Other (See Comments)   Penicillins Other (See Comments)   Shellfish Allergy Hives, Itching, Photosensitivity and Rash   Shiitake Mushroom (Obsolete) Hives, Itching, Photosensitivity and Rash     Review of Systems  Constitutional: Negative.   Eyes: Negative.  Negative for blurred vision.  Respiratory: Negative.    Cardiovascular: Negative.  Negative for chest pain.  Gastrointestinal: Negative.   Endocrine: Negative for polydipsia, polyphagia and polyuria.  Musculoskeletal: Negative.   Skin: Negative.   Psychiatric/Behavioral: Negative.       Today's Vitals   06/13/24 1525  BP: 120/80  Pulse: 72  Temp: 98.1 F (36.7 C)  TempSrc: Oral  Weight: 198 lb 9.6 oz (90.1 kg)  Height: 5' 4 (1.626 m)  PainSc: 0-No pain   Body mass index is 34.09 kg/m.  Wt Readings from Last 3 Encounters:  06/13/24 198 lb 9.6 oz (90.1 kg)  03/15/24 208 lb (94.3 kg)  11/22/23 208 lb (94.3 kg)    The 10-year ASCVD risk score (Arnett DK, et al., 2019) is: 14.3%   Values used to calculate the score:     Age: 20 years     Clincally relevant sex: Female     Is Non-Hispanic African American: Yes     Diabetic: Yes     Tobacco smoker: No     Systolic Blood Pressure: 120 mmHg     Is BP treated: No     HDL Cholesterol: 94 mg/dL     Total  Cholesterol: 182 mg/dL  Objective:  Physical Exam Vitals and nursing note reviewed.  Constitutional:      Appearance: Normal appearance. She is obese.  HENT:     Head: Normocephalic and atraumatic.   Eyes:     Extraocular Movements: Extraocular movements intact.    Cardiovascular:     Rate and Rhythm: Normal rate and regular rhythm.     Heart sounds: Normal heart sounds.  Pulmonary:     Effort: Pulmonary effort is normal.     Breath sounds: Normal breath sounds.   Musculoskeletal:     Cervical back: Normal range of motion.  Skin:    General: Skin is warm.   Neurological:     General: No focal deficit present.     Mental Status: She is alert.   Psychiatric:        Mood and Affect: Mood normal.        Behavior: Behavior normal.         Assessment And Plan:  Dyslipidemia associated with type 2 diabetes mellitus (HCC) Assessment & Plan: Chronic, LDL goal < 70.  She will continue with atorvastatin  10mg  M-F.  She is now on Ozempic  2mg  weekly.  Not checking blood sugars due to unavailability of Dexcom sensors. Current medication includes Ozempic  2 mg weekly.  Lipoprotein A test planned to evaluate genetic predisposition to heart disease. Discussed cardiac calcium  scoring for arterial calcifications. - Order lipoprotein A test. - Order cholesterol panel. - Provide information about cardiac calcium  scoring. - Provide regular glucose meter for backup monitoring. - Check HbA1c level   Orders: -     CMP14+EGFR -     Lipid panel -     Lipoprotein A (LPA) -     Hemoglobin A1c  Cyst of left breast Assessment & Plan: Recurrent fluid discharge, previously evaluated as a cyst, with significant bloody discharge. - Follow-up with breast specialist on July 7th. - Consider surgical removal if issue persists.   Class 1 obesity due to excess calories with serious comorbidity and body mass index (BMI) of 34.0 to 34.9 in adult Assessment & Plan: She is encouraged to strive for  BMI less than 30 to decrease cardiac risk. Advised to aim for at least 150 minutes of exercise per week.     Return in 3 months (on 09/13/2024), or dm check.  Patient was given opportunity to ask questions. Patient verbalized understanding of the plan and was able to repeat key elements of the plan. All questions were answered to their satisfaction.    I, Catheryn LOISE Slocumb, MD, have reviewed all documentation for this visit. The documentation on 06/18/24 for the exam, diagnosis, procedures, and orders are all accurate and complete.   IF YOU HAVE BEEN REFERRED TO A SPECIALIST, IT MAY TAKE 1-2 WEEKS TO SCHEDULE/PROCESS THE REFERRAL. IF YOU HAVE NOT HEARD FROM US /SPECIALIST IN TWO WEEKS, PLEASE GIVE US  A CALL AT [phone].

## 2024-06-14 ENCOUNTER — Ambulatory Visit: Payer: Self-pay | Admitting: Internal Medicine

## 2024-06-14 LAB — CMP14+EGFR
ALT: 15 IU/L (ref 0–32)
AST: 25 IU/L (ref 0–40)
Albumin: 4.1 g/dL (ref 3.9–4.9)
Alkaline Phosphatase: 80 IU/L (ref 44–121)
BUN/Creatinine Ratio: 14 (ref 12–28)
BUN: 12 mg/dL (ref 8–27)
Bilirubin Total: 0.3 mg/dL (ref 0.0–1.2)
CO2: 22 mmol/L (ref 20–29)
Calcium: 9.4 mg/dL (ref 8.7–10.3)
Chloride: 103 mmol/L (ref 96–106)
Creatinine, Ser: 0.87 mg/dL (ref 0.57–1.00)
Globulin, Total: 2.4 g/dL (ref 1.5–4.5)
Glucose: 80 mg/dL (ref 70–99)
Potassium: 4.3 mmol/L (ref 3.5–5.2)
Sodium: 139 mmol/L (ref 134–144)
Total Protein: 6.5 g/dL (ref 6.0–8.5)
eGFR: 73 mL/min/{1.73_m2} (ref >59)

## 2024-06-14 LAB — LIPID PANEL
Chol/HDL Ratio: 1.9 ratio (ref 0.0–4.4)
Cholesterol, Total: 182 mg/dL (ref 100–199)
HDL: 94 mg/dL (ref >39)
LDL Chol Calc (NIH): 78 mg/dL (ref 0–99)
Triglycerides: 52 mg/dL (ref 0–149)
VLDL Cholesterol Cal: 10 mg/dL (ref 5–40)

## 2024-06-14 LAB — HEMOGLOBIN A1C
Est. average glucose Bld gHb Est-mCnc: 120 mg/dL
Hgb A1c MFr Bld: 5.8 % — ABNORMAL HIGH (ref 4.8–5.6)

## 2024-06-14 LAB — LIPOPROTEIN A (LPA): Lipoprotein (a): 59.6 nmol/L (ref <75.0)

## 2024-06-18 DIAGNOSIS — N6009 Solitary cyst of unspecified breast: Secondary | ICD-10-CM | POA: Insufficient documentation

## 2024-06-18 DIAGNOSIS — E66811 Obesity, class 1: Secondary | ICD-10-CM | POA: Insufficient documentation

## 2024-06-18 NOTE — Assessment & Plan Note (Signed)
 She is encouraged to strive for BMI less than 30 to decrease cardiac risk. Advised to aim for at least 150 minutes of exercise per week.

## 2024-06-18 NOTE — Assessment & Plan Note (Signed)
 Recurrent fluid discharge, previously evaluated as a cyst, with significant bloody discharge. - Follow-up with breast specialist on July 7th. - Consider surgical removal if issue persists.

## 2024-06-18 NOTE — Assessment & Plan Note (Addendum)
 Chronic, LDL goal < 70.  She will continue with atorvastatin  10mg  M-F.  She is now on Ozempic  2mg  weekly.  Not checking blood sugars due to unavailability of Dexcom sensors. Current medication includes Ozempic  2 mg weekly.  Lipoprotein A test planned to evaluate genetic predisposition to heart disease. Discussed cardiac calcium  scoring for arterial calcifications. - Order lipoprotein A test. - Order cholesterol panel. - Provide information about cardiac calcium  scoring. - Provide regular glucose meter for backup monitoring. - Check HbA1c level

## 2024-06-26 ENCOUNTER — Ambulatory Visit: Payer: Self-pay | Admitting: Internal Medicine

## 2024-06-26 ENCOUNTER — Ambulatory Visit
Admission: RE | Admit: 2024-06-26 | Discharge: 2024-06-26 | Disposition: A | Discharge status: Home / Self Care | Source: Ambulatory Visit | Attending: Internal Medicine | Admitting: Internal Medicine

## 2024-06-26 DIAGNOSIS — N6320 Unspecified lump in the left breast, unspecified quadrant: Secondary | ICD-10-CM

## 2024-09-04 ENCOUNTER — Other Ambulatory Visit: Payer: Self-pay

## 2024-09-04 DIAGNOSIS — E1169 Type 2 diabetes mellitus with other specified complication: Secondary | ICD-10-CM

## 2024-09-04 MED ORDER — DEXCOM G7 SENSOR MISC
1 refills | Status: AC
Start: 2024-09-04 — End: ?

## 2024-09-05 ENCOUNTER — Encounter: Payer: Self-pay | Admitting: Internal Medicine

## 2024-09-13 ENCOUNTER — Ambulatory Visit: Admitting: Internal Medicine

## 2024-09-13 ENCOUNTER — Encounter: Payer: Self-pay | Admitting: Internal Medicine

## 2024-09-13 ENCOUNTER — Ambulatory Visit: Attending: Internal Medicine

## 2024-09-13 VITALS — BP 110/80 | HR 67 | Temp 98.1°F | Ht 64.0 in | Wt 191.8 lb

## 2024-09-13 DIAGNOSIS — E785 Hyperlipidemia, unspecified: Secondary | ICD-10-CM | POA: Diagnosis not present

## 2024-09-13 DIAGNOSIS — E66811 Obesity, class 1: Secondary | ICD-10-CM | POA: Diagnosis not present

## 2024-09-13 DIAGNOSIS — R002 Palpitations: Secondary | ICD-10-CM

## 2024-09-13 DIAGNOSIS — E1169 Type 2 diabetes mellitus with other specified complication: Secondary | ICD-10-CM

## 2024-09-13 DIAGNOSIS — E6609 Other obesity due to excess calories: Secondary | ICD-10-CM

## 2024-09-13 DIAGNOSIS — Z6832 Body mass index (BMI) 32.0-32.9, adult: Secondary | ICD-10-CM

## 2024-09-13 NOTE — Assessment & Plan Note (Signed)
 Intermittent palpitations possibly due to dehydration or electrolyte imbalance.  - Order blood work to check magnesium levels. - Review thyroid function test history. - Consider Holter monitor for 14 days. - Advise increased water intake and consider electrolyte supplementation. - Recommend magnesium supplementation

## 2024-09-13 NOTE — Progress Notes (Signed)
 I,Victoria T Emmitt, CMA,acting as a Neurosurgeon for Catheryn LOISE Slocumb, MD.,have documented all relevant documentation on the behalf of Catheryn LOISE Slocumb, MD,as directed by  Catheryn LOISE Slocumb, MD while in the presence of Catheryn LOISE Slocumb, MD.  Subjective:  Patient ID: Stephanie French , female    DOB: 1956-12-05 , 68 y.o.   MRN: [id number]  Chief Complaint  Patient presents with   Diabetes    She presents today for DM check. She reports compliance with meds. She denies headaches, chest pain and shortness of breath.     HPI Discussed the use of AI scribe software for clinical note transcription with the patient, who gave verbal consent to proceed.  History of Present Illness Stephanie French is a 68 year old female with diabetes who presents for a follow-up visit.  Her blood sugar levels have been stable, ranging between 90 and 100 mg/dL. She uses a Dexcom device for monitoring, although there was a recent issue with the device that required replacement.  She experiences occasional palpitations, described as a 'flutter' or feeling a different beat in her chest, which occur infrequently and are sometimes associated with dehydration. No sweating accompanies these episodes. A previous echocardiogram in 2020 showed trace mitral and tricuspid regurgitation. No worsening shortness of breath or chest pain. She reports feeling 'weird' in her chest sometimes, which she describes as a flutter or palpitations.  Her dietary habits have changed following the extraction of her teeth, leading to concerns about potential malnutrition. She is taking a multivitamin but has reduced her fruit intake due to difficulty chewing. She used to eat an apple daily but now consumes cooked apples instead. She engages in walking as her primary form of exercise, approximately three days a week for forty five minutes each session.   Diabetes She presents for her follow-up diabetic visit. She has type 2 diabetes mellitus. There are no  hypoglycemic associated symptoms. Pertinent negatives for diabetes include no blurred vision, no chest pain, no polydipsia, no polyphagia and no polyuria. There are no hypoglycemic complications. Symptoms are stable. Risk factors for coronary artery disease include diabetes mellitus, obesity and post-menopausal. Her breakfast blood glucose is taken between 8-9 am. Her breakfast blood glucose range is generally 90-110 mg/dl. An ACE inhibitor/angiotensin II receptor blocker is not being taken.  Hyperlipidemia This is a chronic problem. The current episode started more than 1 year ago. Exacerbating diseases include diabetes and obesity. Pertinent negatives include no chest pain. Current antihyperlipidemic treatment includes statins. Risk factors for coronary artery disease include diabetes mellitus, dyslipidemia, obesity and post-menopausal.     Past Medical History:  Diagnosis Date   Anemia    Anxiety    Atypical chest pain 08/31/2019   Back pain    Constipation    Depression    Diabetes mellitus    Edema of both lower extremities    GERD (gastroesophageal reflux disease)    Hyperlipidemia    Joint pain    Obesity    Pure hypercholesterolemia 12/07/2019   Shortness of breath 08/31/2019   Vitamin D  deficiency      Family History  Problem Relation Age of Onset   Diabetes Mother    Hypertension Mother    Stroke Mother    Heart attack Mother    High Cholesterol Mother    Early death Father    Lung cancer Father    Diabetes Sister    Hypertension Sister    Breast cancer Maternal Grandmother  Heart attack Maternal Grandmother    Cancer Other    Hypertension Other    Hyperlipidemia Other    Stroke Other    Heart attack Other      Current Outpatient Medications:    atorvastatin  (LIPITOR) 10 MG tablet, One tab po qd M-F, skip Sat/Sundays, Disp: 90 tablet, Rfl: 2   Continuous Glucose Sensor (DEXCOM G7 SENSOR) MISC, USE AS DIRECTED TO CHECK BLOOD SUGARS., Disp: 3 each, Rfl: 1    hydrocortisone  (ANUSOL -HC) 2.5 % rectal cream, Place 1 application rectally 2 (two) times daily as needed for hemorrhoids or anal itching., Disp: 30 g, Rfl: 0   meloxicam  (MOBIC ) 7.5 MG tablet, Take 1 tablet (7.5 mg total) by mouth 2 (two) times daily as needed for pain., Disp: 30 tablet, Rfl: 2   Multiple Vitamin (MULTIVITAMIN) capsule, Take 1 capsule by mouth daily. For 50 plus, Disp: , Rfl:    ONETOUCH VERIO test strip, USE AS INSTRUCTED TO CHECK BLOOD SUGAR 3 TIMES A DAY. DX CODE E11.65, Disp: 300 strip, Rfl: 12   Semaglutide , 2 MG/DOSE, 8 MG/3ML SOPN, Inject 2 mg as directed once a week., Disp: 9 mL, Rfl: 2   valsartan  (DIOVAN ) 40 MG tablet, TAKE 1 TABLET BY MOUTH EVERY DAY, Disp: 90 tablet, Rfl: 2   Allergies  Allergen Reactions   Egg-Derived Products Other (See Comments)    Allergic to the protein in the egg white 07/12/23 reports she can eat egg whites   Latex    Other Itching and Other (See Comments)    Other reaction(s): Unknown   Peanut-Containing Drug Products Other (See Comments)   Penicillins Other (See Comments)   Shellfish Allergy Hives, Itching, Photosensitivity and Rash   Shiitake Mushroom (Obsolete) Hives, Itching, Photosensitivity and Rash     Review of Systems  Constitutional: Negative.   Eyes:  Negative for blurred vision.  Respiratory: Negative.    Cardiovascular:  Positive for palpitations. Negative for chest pain.  Gastrointestinal: Negative.   Endocrine: Negative for polydipsia, polyphagia and polyuria.  Neurological: Negative.   Psychiatric/Behavioral: Negative.       Today's Vitals   09/13/24 1424  BP: 110/80  Pulse: 67  Temp: 98.1 F (36.7 C)  SpO2: 98%  Weight: 191 lb 12.8 oz (87 kg)  Height: 5' 4 (1.626 m)   Body mass index is 32.92 kg/m.  Wt Readings from Last 3 Encounters:  09/13/24 191 lb 12.8 oz (87 kg)  06/13/24 198 lb 9.6 oz (90.1 kg)  03/15/24 208 lb (94.3 kg)     Objective:  Physical Exam Vitals and nursing note reviewed.   Constitutional:      Appearance: Normal appearance. She is obese.  HENT:     Head: Normocephalic and atraumatic.  Eyes:     Extraocular Movements: Extraocular movements intact.  Cardiovascular:     Rate and Rhythm: Normal rate and regular rhythm.     Heart sounds: Normal heart sounds.  Pulmonary:     Effort: Pulmonary effort is normal.     Breath sounds: Normal breath sounds.  Musculoskeletal:     Cervical back: Normal range of motion.  Skin:    General: Skin is warm.  Neurological:     General: No focal deficit present.     Mental Status: She is alert.  Psychiatric:        Mood and Affect: Mood normal.        Behavior: Behavior normal.         Assessment And Plan:  Dyslipidemia associated with type 2 diabetes mellitus (HCC) Assessment & Plan: Blood glucose levels stable between 90-100 mg/dL. Dexcom sensor error identified. - Continue with weekly Ozempic , 2mg  - Provide replacement Dexcom sensor if available. - Schedule A1c test.  Orders: -     Hemoglobin A1c -     BMP8+EGFR  Palpitation Assessment & Plan: Intermittent palpitations possibly due to dehydration or electrolyte imbalance.  - Order blood work to check magnesium levels. - Review thyroid function test history. - Consider Holter monitor for 14 days. - Advise increased water intake and consider electrolyte supplementation. - Recommend magnesium supplementation  Orders: -     Magnesium -     TSH -     LONG TERM MONITOR (3-14 DAYS); Future  Class 1 obesity due to excess calories with serious comorbidity and body mass index (BMI) of 32.0 to 32.9 in adult Assessment & Plan: She has lost another 7lbs! This is wonderful. She was commended on her lifestyle changes and encouraged to keep up the great work! - She is encouraged to strive for BMI less than 30 to decrease cardiac risk. Advised to aim for at least 150 minutes of exercise per week.     Return if symptoms worsen or fail to improve.  Patient was  given opportunity to ask questions. Patient verbalized understanding of the plan and was able to repeat key elements of the plan. All questions were answered to their satisfaction.   I, Catheryn LOISE Slocumb, MD, have reviewed all documentation for this visit. The documentation on 09/13/24 for the exam, diagnosis, procedures, and orders are all accurate and complete.   IF YOU HAVE BEEN REFERRED TO A SPECIALIST, IT MAY TAKE 1-2 WEEKS TO SCHEDULE/PROCESS THE REFERRAL. IF YOU HAVE NOT HEARD FROM US /SPECIALIST IN TWO WEEKS, PLEASE GIVE US  A CALL AT [phone].   THE PATIENT IS ENCOURAGED TO PRACTICE SOCIAL DISTANCING DUE TO THE COVID-19 PANDEMIC.

## 2024-09-13 NOTE — Assessment & Plan Note (Signed)
 Blood glucose levels stable between 90-100 mg/dL. Dexcom sensor error identified. - Continue with weekly Ozempic , 2mg  - Provide replacement Dexcom sensor if available. - Schedule A1c test.

## 2024-09-13 NOTE — Progress Notes (Unsigned)
 EP to read.

## 2024-09-13 NOTE — Patient Instructions (Addendum)
 Magnesium glycinate  Type 2 Diabetes Mellitus, Diagnosis, Adult Type 2 diabetes (type 2 diabetes mellitus) is a long-term (chronic) disease. It may happen when there is one or both of these problems: The pancreas does not make enough insulin. The body does not react in a normal way to insulin that it makes. Insulin lets sugars go into cells in your body. If you have type 2 diabetes, sugars cannot get into your cells. Sugars build up in the blood. This causes high blood sugar. What are the causes? The exact cause of this condition is not known. What increases the risk? Having type 2 diabetes in your family. Being overweight or very overweight. Not being active. Your body not reacting in a normal way to the insulin it makes. Having higher than normal blood sugar over time. Having a type of diabetes when you were pregnant. Having a condition that causes small fluid-filled sacs on your ovaries. What are the signs or symptoms? At first, you may have no symptoms. You will get symptoms slowly. They may include: More thirst than normal. More hunger than normal. Needing to pee more than normal. Losing weight without trying. Feeling tired. Feeling weak. Seeing things blurry. Dark patches on your skin. How is this treated? This condition may be treated by a diabetes expert. You may need to: Follow an eating plan made by a food expert (dietitian). Get regular exercise. Find ways to deal with stress. Check blood sugar as often as told. Take medicines. Your doctor will set treatment goals for you. Your blood sugar should be at these levels: Before meals: 80-130 mg/dL (3.2-4.4 mmol/L). After meals: below 180 mg/dL (10 mmol/L). Over the last 2-3 months: less than 7%. Follow these instructions at home: Medicines Take your diabetes medicines or insulin every day. Take medicines as told to help you prevent other problems caused by this condition. You may need: Aspirin. Medicine to lower  cholesterol. Medicine to control blood pressure. Questions to ask your doctor Should I meet with a diabetes educator? What medicines do I need, and when should I take them? What will I need to treat my condition at home? When should I check my blood sugar? Where can I find a support group? Who can I call if I have questions? When is my next doctor visit? General instructions Take over-the-counter and prescription medicines only as told by your doctor. Keep all follow-up visits. Where to find more information For help and guidance and more information about diabetes, please go to: American Diabetes Association (ADA): www.diabetes.org American Association of Diabetes Care and Education Specialists (ADCES): www.diabeteseducator.org International Diabetes Federation (IDF): DCOnly.dk Contact a doctor if: Your blood sugar is at or above 240 mg/dL (01.0 mmol/L) for 2 days in a row. You have been sick for 2 days or more, and you are not getting better. You have had a fever for 2 days or more, and you are not getting better. You have any of these problems for more than 6 hours: You cannot eat or drink. You feel like you may vomit. You vomit. You have watery poop (diarrhea). Get help right away if: Your blood sugar is lower than 54 mg/dL (3 mmol/L). You feel mixed up (confused). You have trouble thinking clearly. You have trouble breathing. You have medium or large ketone levels in your pee. These symptoms may be an emergency. Get help right away. Call your local emergency services (911 in the [place] Do not wait to see if the symptoms will go away. Do  not drive yourself to the hospital. Summary Type 2 diabetes is a long-term disease. Your pancreas may not make enough insulin, or your body may not react in a normal way to insulin that it makes. This condition is treated with an eating plan, lifestyle changes, and medicines. Your doctor will set treatment goals for you. These will help  you keep your blood sugar in a healthy range. Keep all follow-up visits. This information is not intended to replace advice given to you by your health care provider. Make sure you discuss any questions you have with your health care provider. Document Revised: 03/03/2021 Document Reviewed: 03/03/2021 Elsevier Patient Education  2024 [employer].

## 2024-09-13 NOTE — Assessment & Plan Note (Signed)
 She has lost another 7lbs! This is wonderful. She was commended on her lifestyle changes and encouraged to keep up the great work! - She is encouraged to strive for BMI less than 30 to decrease cardiac risk. Advised to aim for at least 150 minutes of exercise per week.

## 2024-09-14 LAB — BMP8+EGFR
BUN/Creatinine Ratio: 13 (ref 12–28)
BUN: 11 mg/dL (ref 8–27)
CO2: 22 mmol/L (ref 20–29)
Calcium: 9.5 mg/dL (ref 8.7–10.3)
Chloride: 102 mmol/L (ref 96–106)
Creatinine, Ser: 0.87 mg/dL (ref 0.57–1.00)
Glucose: 72 mg/dL (ref 70–99)
Potassium: 4.6 mmol/L (ref 3.5–5.2)
Sodium: 138 mmol/L (ref 134–144)
eGFR: 73 mL/min/1.73 (ref >59)

## 2024-09-14 LAB — HEMOGLOBIN A1C
Est. average glucose Bld gHb Est-mCnc: 117 mg/dL
Hgb A1c MFr Bld: 5.7 % — ABNORMAL HIGH (ref 4.8–5.6)

## 2024-09-14 LAB — TSH: TSH: 1.87 u[IU]/mL (ref 0.450–4.500)

## 2024-09-14 LAB — MAGNESIUM: Magnesium: 2.2 mg/dL (ref 1.6–2.3)

## 2024-09-16 ENCOUNTER — Ambulatory Visit: Payer: Self-pay | Admitting: Internal Medicine

## 2024-10-03 ENCOUNTER — Ambulatory Visit

## 2024-10-03 NOTE — Progress Notes (Cosign Needed Addendum)
 Pt presents for help w/applying Zio patch -patch applied to left side of chest  Start time at 15:00 pm  -pt educated that she needs to return monitor after 14 day prescribed wear

## 2024-10-17 DIAGNOSIS — R002 Palpitations: Secondary | ICD-10-CM | POA: Diagnosis not present

## 2024-11-20 ENCOUNTER — Other Ambulatory Visit: Payer: Self-pay | Admitting: Internal Medicine

## 2024-11-20 ENCOUNTER — Ambulatory Visit: Payer: Self-pay | Admitting: Internal Medicine

## 2024-11-20 VITALS — BP 118/70 | HR 64 | Temp 98.1°F | Ht 64.0 in | Wt 192.6 lb

## 2024-11-20 DIAGNOSIS — M25512 Pain in left shoulder: Secondary | ICD-10-CM | POA: Insufficient documentation

## 2024-11-20 DIAGNOSIS — E785 Hyperlipidemia, unspecified: Secondary | ICD-10-CM | POA: Diagnosis not present

## 2024-11-20 DIAGNOSIS — Z0000 Encounter for general adult medical examination without abnormal findings: Secondary | ICD-10-CM

## 2024-11-20 DIAGNOSIS — W010XXA Fall on same level from slipping, tripping and stumbling without subsequent striking against object, initial encounter: Secondary | ICD-10-CM | POA: Diagnosis not present

## 2024-11-20 DIAGNOSIS — E1169 Type 2 diabetes mellitus with other specified complication: Secondary | ICD-10-CM | POA: Diagnosis not present

## 2024-11-20 LAB — POCT URINALYSIS DIPSTICK
Bilirubin, UA: NEGATIVE
Glucose, UA: NEGATIVE
Ketones, UA: NEGATIVE
Leukocytes, UA: NEGATIVE
Nitrite, UA: NEGATIVE
Protein, UA: NEGATIVE
Spec Grav, UA: <=1.005 — AB (ref 1.010–1.025)
Urobilinogen, UA: 0.2 U/dL
pH, UA: 6 (ref 5.0–8.0)

## 2024-11-20 MED ORDER — MELOXICAM 7.5 MG PO TABS: 7.5000 mg | ORAL_TABLET | Freq: Two times a day (BID) | ORAL | 0 refills | Status: AC | PRN

## 2024-11-20 MED ORDER — VALSARTAN 40 MG PO TABS: 40.0000 mg | ORAL_TABLET | Freq: Every day | ORAL | 2 refills | Status: AC

## 2024-11-20 MED ORDER — SEMAGLUTIDE (2 MG/DOSE) 8 MG/3ML ~~LOC~~ SOPN: 2.0000 mg | PEN_INJECTOR | SUBCUTANEOUS | 2 refills | Status: AC

## 2024-11-20 MED ORDER — ATORVASTATIN CALCIUM 10 MG PO TABS: ORAL_TABLET | ORAL | 2 refills | Status: DC

## 2024-11-20 NOTE — Patient Instructions (Signed)

## 2024-11-20 NOTE — Assessment & Plan Note (Signed)
-   Occurred early November.  - Fall precautions discussed.

## 2024-11-20 NOTE — Assessment & Plan Note (Signed)
 Pain with arm elevation since fall in early November. No prior use of anti-inflammatory medications or orthopedic evaluation. - Prescribed meloxicam  prn for 7-10 days with food. - Advised use of topical treatments Vicks, Biofreeze or any muscle rub - Will consider referral to orthopedics if pain persists.

## 2024-11-20 NOTE — Assessment & Plan Note (Signed)

## 2024-11-20 NOTE — Progress Notes (Signed)
 I,Victoria T Emmitt, CMA,acting as a neurosurgeon for Catheryn LOISE Slocumb, MD.,have documented all relevant documentation on the behalf of Catheryn LOISE Slocumb, MD,as directed by  Catheryn LOISE Slocumb, MD while in the presence of Catheryn LOISE Slocumb, MD.  Subjective:    Patient ID: Stephanie French , female    DOB: 29-Apr-1956 , 68 y.o.   MRN: [id number]  Chief Complaint  Patient presents with   Annual Exam    She is here today for a full physical examination. She is not followed by GYN. She is due for pap smear in 2025. She reports compliance with meds. She denies having any headaches, chest pain and shortness of breath.    Diabetes    HPI Discussed the use of AI scribe software for clinical note transcription with the patient, who gave verbal consent to proceed.  History of Present Illness Stephanie French is a 68 year old female with diabetes who presents for a physical and diabetes check.  Her blood sugar levels have ranged from 66 to 170 mg/dL. She experienced hypoglycemia at 66 mg/dL while asleep, attributing it to eating early and difficulty incorporating more protein into her diet due to dental work and mouth soreness. She is currently taking Ozempic  2 mg weekly for diabetes management. Her last HbA1c was 5.7% at the end of September, and she has lost six pounds since June, maintaining her weight since September.  She is taking atorvastatin  10 mg Monday through Friday and valsartan  40 mg daily for blood pressure and kidney protection. She has not filled her atorvastatin  prescription since June due to having a surplus.  In early November, she experienced a fall at a store called [employer], tripping over a carpet sticking out from a cart. Since the fall, she has had persistent soreness in her shoulder, which is painful when raising her arm above her head. She has been trying to maintain her range of motion by moving the arm but has not taken any medication for the pain.  She mentions a family history of heart disease  involving her grandmother. She exercises regularly and reports good bowel movements. She also notes inflammation on her nose, which she describes as being more towards the back of the area. No current concerns. She confirms performing her own breast exams and has not eaten before the appointment.   Diabetes She presents for her follow-up diabetic visit. She has type 2 diabetes mellitus. Her disease course has been stable. There are no hypoglycemic associated symptoms. Pertinent negatives for diabetes include no blurred vision, no chest pain, no polydipsia, no polyphagia and no polyuria. There are no hypoglycemic complications. Symptoms are stable. Risk factors for coronary artery disease include diabetes mellitus, obesity and post-menopausal. She participates in exercise intermittently. Her breakfast blood glucose is taken between 8-9 am. Her breakfast blood glucose range is generally 90-110 mg/dl. An ACE inhibitor/angiotensin II receptor blocker is not being taken. Eye exam is not current.  Hyperlipidemia This is a chronic problem. The current episode started more than 1 year ago. Exacerbating diseases include diabetes and obesity. Pertinent negatives include no chest pain. Current antihyperlipidemic treatment includes statins. Risk factors for coronary artery disease include diabetes mellitus, dyslipidemia, obesity and post-menopausal.     Past Medical History:  Diagnosis Date   Anemia    Anxiety    Atypical chest pain 08/31/2019   Back pain    Constipation    Depression    Diabetes mellitus    Edema of both lower extremities  GERD (gastroesophageal reflux disease)    Hyperlipidemia    Joint pain    Obesity    Pure hypercholesterolemia 12/07/2019   Shortness of breath 08/31/2019   Vitamin D  deficiency      Family History  Problem Relation Age of Onset   Diabetes Mother    Hypertension Mother    Stroke Mother    Heart attack Mother    High Cholesterol Mother    Early death  Father    Lung cancer Father    Diabetes Sister    Hypertension Sister    Breast cancer Maternal Grandmother    Heart attack Maternal Grandmother    Cancer Other    Hypertension Other    Hyperlipidemia Other    Stroke Other    Heart attack Other      Current Outpatient Medications:    Continuous Glucose Sensor (DEXCOM G7 SENSOR) MISC, USE AS DIRECTED TO CHECK BLOOD SUGARS., Disp: 3 each, Rfl: 1   hydrocortisone  (ANUSOL -HC) 2.5 % rectal cream, Place 1 application rectally 2 (two) times daily as needed for hemorrhoids or anal itching., Disp: 30 g, Rfl: 0   Multiple Vitamin (MULTIVITAMIN) capsule, Take 1 capsule by mouth daily. For 50 plus, Disp: , Rfl:    ONETOUCH VERIO test strip, USE AS INSTRUCTED TO CHECK BLOOD SUGAR 3 TIMES A DAY. DX CODE E11.65, Disp: 300 strip, Rfl: 12   atorvastatin  (LIPITOR) 10 MG tablet, One tab po qd M-F, skip Sat/Sundays, Disp: 75 tablet, Rfl: 2   meloxicam  (MOBIC ) 7.5 MG tablet, Take 1 tablet (7.5 mg total) by mouth 2 (two) times daily as needed for pain., Disp: 30 tablet, Rfl: 0   Semaglutide , 2 MG/DOSE, 8 MG/3ML SOPN, Inject 2 mg as directed once a week., Disp: 9 mL, Rfl: 2   valsartan  (DIOVAN ) 40 MG tablet, Take 1 tablet (40 mg total) by mouth daily., Disp: 90 tablet, Rfl: 2   Allergies  Allergen Reactions   Egg Protein-Containing Drug Products Other (See Comments)    Allergic to the protein in the egg white 07/12/23 reports she can eat egg whites   Latex    Other Itching and Other (See Comments)    Other reaction(s): Unknown   Peanut-Containing Drug Products Other (See Comments)   Penicillins Other (See Comments)   Shellfish Allergy Hives, Itching, Photosensitivity and Rash   Shiitake Mushroom (Obsolete) Hives, Itching, Photosensitivity and Rash      The patient states she uses post menopausal status for birth control. Patient's last menstrual period was 08/12/2012.. Negative for Dysmenorrhea. Negative for: breast discharge, breast lump(s), breast  pain and breast self exam. Associated symptoms include abnormal vaginal bleeding. Pertinent negatives include abnormal bleeding (hematology), anxiety, decreased libido, depression, difficulty falling sleep, dyspareunia, history of infertility, nocturia, sexual dysfunction, sleep disturbances, urinary incontinence, urinary urgency, vaginal discharge and vaginal itching. Diet regular.The patient states her exercise level is  moderate.  . The patient's tobacco use is:  Social History   Tobacco Use  Smoking Status Former   Current packs/day: 0.10   Average packs/day: 0.1 packs/day for 40.0 years (4.0 ttl pk-yrs)   Types: Cigarettes  Smokeless Tobacco Never  . She has been exposed to passive smoke. The patient's alcohol use is:  Social History   Substance and Sexual Activity  Alcohol Use No    Review of Systems  Constitutional: Negative.   HENT: Negative.    Eyes: Negative.  Negative for blurred vision.  Respiratory: Negative.    Cardiovascular: Negative.  Negative for  chest pain.  Gastrointestinal: Negative.   Endocrine: Negative.  Negative for polydipsia, polyphagia and polyuria.  Genitourinary: Negative.   Musculoskeletal: Negative.   Skin: Negative.   Allergic/Immunologic: Negative.   Neurological: Negative.   Hematological: Negative.   Psychiatric/Behavioral: Negative.       Today's Vitals   11/20/24 0824  BP: 118/70  Pulse: 64  Temp: 98.1 F (36.7 C)  SpO2: 98%  Weight: 192 lb 9.6 oz (87.4 kg)  Height: 5' 4 (1.626 m)   Body mass index is 33.06 kg/m.  Wt Readings from Last 3 Encounters:  11/20/24 192 lb 9.6 oz (87.4 kg)  09/13/24 191 lb 12.8 oz (87 kg)  06/13/24 198 lb 9.6 oz (90.1 kg)     Objective:  Physical Exam Vitals and nursing note reviewed.  Constitutional:      Appearance: Normal appearance. She is obese.  HENT:     Head: Normocephalic and atraumatic.     Right Ear: Tympanic membrane, ear canal and external ear normal.     Left Ear: Tympanic  membrane, ear canal and external ear normal.     Nose: Nose normal.     Mouth/Throat:     Mouth: Mucous membranes are moist.     Pharynx: Oropharynx is clear.  Eyes:     Extraocular Movements: Extraocular movements intact.     Conjunctiva/sclera: Conjunctivae normal.     Pupils: Pupils are equal, round, and reactive to light.  Cardiovascular:     Rate and Rhythm: Normal rate and regular rhythm.     Pulses: Normal pulses.          Dorsalis pedis pulses are 2+ on the right side and 2+ on the left side.     Heart sounds: Normal heart sounds.  Pulmonary:     Effort: Pulmonary effort is normal.     Breath sounds: Normal breath sounds.  Chest:  Breasts:    Tanner Score is 5.     Right: Normal.     Left: Normal.  Abdominal:     General: Bowel sounds are normal.     Palpations: Abdomen is soft.  Genitourinary:    Comments: deferred Musculoskeletal:        General: Normal range of motion.     Cervical back: Normal range of motion and neck supple.  Feet:     Right foot:     Protective Sensation: 5 sites tested.  5 sites sensed.     Skin integrity: Dry skin present.     Toenail Condition: Right toenails are normal.     Left foot:     Protective Sensation: 5 sites tested.  5 sites sensed.     Skin integrity: Dry skin present.     Toenail Condition: Left toenails are normal.  Skin:    General: Skin is warm and dry.  Neurological:     General: No focal deficit present.     Mental Status: She is alert and oriented to person, place, and time.  Psychiatric:        Mood and Affect: Mood normal.        Behavior: Behavior normal.         Assessment And Plan:     Encounter for general adult medical examination w/o abnormal findings Assessment & Plan: A full exam was performed.  Importance of monthly self breast exams was discussed with the patient.  She is advised to get 30-45 minutes of regular exercise, no less than four to five days per  week. Both weight-bearing and aerobic  exercises are recommended.  She is advised to follow a healthy diet with at least six fruits/veggies per day, decrease intake of red meat and other saturated fats and to increase fish intake to twice weekly.  Meats/fish should not be fried -- baked, boiled or broiled is preferable. It is also important to cut back on your sugar intake.  Be sure to read labels - try to avoid anything with added sugar, high fructose corn syrup or other sweeteners.  If you must use a sweetener, you can try stevia or monkfruit.  It is also important to avoid artificially sweetened foods/beverages and diet drinks. Lastly, wear SPF 50 sunscreen on exposed skin and when in direct sunlight for an extended period of time.  Be sure to avoid fast food restaurants and aim for at least 60 ounces of water daily.      Orders: -     CBC -     CMP14+EGFR -     Lipid panel  Dyslipidemia associated with type 2 diabetes mellitus (HCC) Assessment & Plan: Blood glucose levels 66-170 mg/dL. Last A1c 5.7% in September, indicating good control. Challenges with protein intake due to dental issues, but weight stable. - Continue current diabetes management regimen including Ozempic  2 mg weekly. - EKG performed, SB w/o acute changes. - LDL goal is less than 70 currently on atorvastatin  M-F - Recheck lipid levels today - I DISCUSSED WITH THE PATIENT AT LENGTH REGARDING THE GOALS OF GLYCEMIC CONTROL AND POSSIBLE LONG-TERM COMPLICATIONS.  I  ALSO STRESSED THE IMPORTANCE OF COMPLIANCE WITH HOME GLUCOSE MONITORING, DIETARY RESTRICTIONS INCLUDING AVOIDANCE OF SUGARY DRINKS/PROCESSED FOODS,  ALONG WITH REGULAR EXERCISE.  I  ALSO STRESSED THE IMPORTANCE OF ANNUAL EYE EXAMS, SELF FOOT CARE AND COMPLIANCE WITH OFFICE VISITS.   Orders: -     EKG 12-Lead -     POCT urinalysis dipstick -     Microalbumin / creatinine urine ratio  Acute pain of left shoulder Assessment & Plan: Pain with arm elevation since fall in early November. No prior use of  anti-inflammatory medications or orthopedic evaluation. - Prescribed meloxicam  prn for 7-10 days with food. - Advised use of topical treatments Vicks, Biofreeze or any muscle rub - Will consider referral to orthopedics if pain persists.   Fall on same level from slipping, tripping or stumbling, initial encounter Assessment & Plan: - Occurred early November.  - Fall precautions discussed.    Other orders -     Atorvastatin  Calcium ; One tab po qd M-F, skip Sat/Sundays  Dispense: 75 tablet; Refill: 2 -     Semaglutide  (2 MG/DOSE); Inject 2 mg as directed once a week.  Dispense: 9 mL; Refill: 2 -     Valsartan ; Take 1 tablet (40 mg total) by mouth daily.  Dispense: 90 tablet; Refill: 2 -     Meloxicam ; Take 1 tablet (7.5 mg total) by mouth 2 (two) times daily as needed for pain.  Dispense: 30 tablet; Refill: 0   Return for 1 year physical, 3 month dm check. Patient was given opportunity to ask questions. Patient verbalized understanding of the plan and was able to repeat key elements of the plan. All questions were answered to their satisfaction.   I, Catheryn LOISE Slocumb, MD, have reviewed all documentation for this visit. The documentation on 11/20/24 for the exam, diagnosis, procedures, and orders are all accurate and complete.

## 2024-11-20 NOTE — Assessment & Plan Note (Addendum)
 Blood glucose levels 66-170 mg/dL. Last A1c 5.7% in September, indicating good control. Challenges with protein intake due to dental issues, but weight stable. - Continue current diabetes management regimen including Ozempic  2 mg weekly. - EKG performed, SB w/o acute changes. - LDL goal is less than 70 currently on atorvastatin  M-F - Recheck lipid levels today - I DISCUSSED WITH THE PATIENT AT LENGTH REGARDING THE GOALS OF GLYCEMIC CONTROL AND POSSIBLE LONG-TERM COMPLICATIONS.  I  ALSO STRESSED THE IMPORTANCE OF COMPLIANCE WITH HOME GLUCOSE MONITORING, DIETARY RESTRICTIONS INCLUDING AVOIDANCE OF SUGARY DRINKS/PROCESSED FOODS,  ALONG WITH REGULAR EXERCISE.  I  ALSO STRESSED THE IMPORTANCE OF ANNUAL EYE EXAMS, SELF FOOT CARE AND COMPLIANCE WITH OFFICE VISITS.

## 2024-11-21 ENCOUNTER — Ambulatory Visit: Payer: Self-pay | Admitting: Internal Medicine

## 2024-11-22 LAB — CMP14+EGFR
ALT: 20 IU/L (ref 0–32)
AST: 25 IU/L (ref 0–40)
Albumin: 4.3 g/dL (ref 3.9–4.9)
Alkaline Phosphatase: 80 IU/L (ref 49–135)
BUN/Creatinine Ratio: 11 — ABNORMAL LOW (ref 12–28)
BUN: 9 mg/dL (ref 8–27)
Bilirubin Total: 0.4 mg/dL (ref 0.0–1.2)
CO2: 23 mmol/L (ref 20–29)
Calcium: 9.7 mg/dL (ref 8.7–10.3)
Chloride: 101 mmol/L (ref 96–106)
Creatinine, Ser: 0.85 mg/dL (ref 0.57–1.00)
Globulin, Total: 2.4 g/dL (ref 1.5–4.5)
Glucose: 72 mg/dL (ref 70–99)
Potassium: 4.5 mmol/L (ref 3.5–5.2)
Sodium: 137 mmol/L (ref 134–144)
Total Protein: 6.7 g/dL (ref 6.0–8.5)
eGFR: 75 mL/min/1.73 (ref >59)

## 2024-11-22 LAB — LIPID PANEL
Chol/HDL Ratio: 2.1 ratio (ref 0.0–4.4)
Cholesterol, Total: 235 mg/dL — ABNORMAL HIGH (ref 100–199)
HDL: 114 mg/dL (ref >39)
LDL Chol Calc (NIH): 113 mg/dL — ABNORMAL HIGH (ref 0–99)
Triglycerides: 47 mg/dL (ref 0–149)
VLDL Cholesterol Cal: 8 mg/dL (ref 5–40)

## 2024-11-22 LAB — CBC
Hematocrit: 39 % (ref 34.0–46.6)
Hemoglobin: 12 g/dL (ref 11.1–15.9)
MCH: 22.4 pg — ABNORMAL LOW (ref 26.6–33.0)
MCHC: 30.8 g/dL — ABNORMAL LOW (ref 31.5–35.7)
MCV: 73 fL — ABNORMAL LOW (ref 79–97)
Platelets: 226 x10E3/uL (ref 150–450)
RBC: 5.35 x10E6/uL — ABNORMAL HIGH (ref 3.77–5.28)
RDW: 16.7 % — ABNORMAL HIGH (ref 11.7–15.4)
WBC: 3.7 x10E3/uL (ref 3.4–10.8)

## 2024-11-22 LAB — MICROALBUMIN / CREATININE URINE RATIO
Creatinine, Urine: 15.3 mg/dL
Microalb/Creat Ratio: <20 mg/g{creat} (ref 0–29)
Microalbumin, Urine: <3 ug/mL

## 2024-11-22 LAB — SPECIMEN STATUS REPORT

## 2024-11-24 ENCOUNTER — Ambulatory Visit: Payer: Self-pay | Admitting: Internal Medicine

## 2024-11-24 DIAGNOSIS — D649 Anemia, unspecified: Secondary | ICD-10-CM

## 2024-11-27 ENCOUNTER — Other Ambulatory Visit: Payer: Self-pay | Admitting: Internal Medicine

## 2024-11-28 ENCOUNTER — Other Ambulatory Visit

## 2024-11-28 DIAGNOSIS — D649 Anemia, unspecified: Secondary | ICD-10-CM

## 2024-11-29 LAB — IRON,TIBC AND FERRITIN PANEL
Ferritin: 79 ng/mL (ref 15–150)
Iron Saturation: 26 % (ref 15–55)
Iron: 79 ug/dL (ref 27–139)
Total Iron Binding Capacity: 307 ug/dL (ref 250–450)
UIBC: 228 ug/dL (ref 118–369)

## 2024-11-29 LAB — VITAMIN B12: Vitamin B-12: 324 pg/mL (ref 232–1245)

## 2024-12-07 ENCOUNTER — Ambulatory Visit: Payer: Self-pay | Admitting: Internal Medicine

## 2025-02-19 ENCOUNTER — Ambulatory Visit: Admitting: Internal Medicine

## 2025-11-22 ENCOUNTER — Encounter: Admitting: Internal Medicine
